# Patient Record
Sex: Female | Born: 1945 | Race: White | Hispanic: No | Marital: Married | Smoking: Never smoker
Health system: Southern US, Community
[De-identification: ages and names within clinical notes are randomized; demographics above are authoritative.]

## PROBLEM LIST (undated history)

## (undated) DIAGNOSIS — F329 Major depressive disorder, single episode, unspecified: Secondary | ICD-10-CM

## (undated) DIAGNOSIS — I10 Essential (primary) hypertension: Secondary | ICD-10-CM

## (undated) DIAGNOSIS — F32A Depression, unspecified: Secondary | ICD-10-CM

## (undated) DIAGNOSIS — R112 Nausea with vomiting, unspecified: Secondary | ICD-10-CM

## (undated) DIAGNOSIS — K219 Gastro-esophageal reflux disease without esophagitis: Secondary | ICD-10-CM

## (undated) DIAGNOSIS — K5790 Diverticulosis of intestine, part unspecified, without perforation or abscess without bleeding: Secondary | ICD-10-CM

## (undated) DIAGNOSIS — J8410 Pulmonary fibrosis, unspecified: Secondary | ICD-10-CM

## (undated) DIAGNOSIS — Z972 Presence of dental prosthetic device (complete) (partial): Secondary | ICD-10-CM

## (undated) DIAGNOSIS — M1990 Unspecified osteoarthritis, unspecified site: Secondary | ICD-10-CM

## (undated) DIAGNOSIS — N390 Urinary tract infection, site not specified: Secondary | ICD-10-CM

## (undated) DIAGNOSIS — Z9889 Other specified postprocedural states: Secondary | ICD-10-CM

## (undated) DIAGNOSIS — E119 Type 2 diabetes mellitus without complications: Secondary | ICD-10-CM

## (undated) DIAGNOSIS — N393 Stress incontinence (female) (male): Secondary | ICD-10-CM

## (undated) DIAGNOSIS — G5602 Carpal tunnel syndrome, left upper limb: Secondary | ICD-10-CM

## (undated) HISTORY — DX: Major depressive disorder, single episode, unspecified: F32.9

## (undated) HISTORY — PX: EYE SURGERY: SHX253

## (undated) HISTORY — DX: Diverticulosis of intestine, part unspecified, without perforation or abscess without bleeding: K57.90

## (undated) HISTORY — PX: TUBAL LIGATION: SHX77

## (undated) HISTORY — PX: CARPAL TUNNEL RELEASE: SHX101

## (undated) HISTORY — PX: ABDOMINAL HYSTERECTOMY: SHX81

## (undated) HISTORY — PX: FRACTURE SURGERY: SHX138

## (undated) HISTORY — DX: Depression, unspecified: F32.A

## (undated) HISTORY — DX: Type 2 diabetes mellitus without complications: E11.9

## (undated) HISTORY — PX: SEPTOPLASTY: SUR1290

## (undated) HISTORY — DX: Gastro-esophageal reflux disease without esophagitis: K21.9

## (undated) HISTORY — PX: COLPORRHAPHY: SHX921

## (undated) HISTORY — DX: Unspecified osteoarthritis, unspecified site: M19.90

## (undated) HISTORY — DX: Essential (primary) hypertension: I10

## (undated) HISTORY — PX: OTHER SURGICAL HISTORY: SHX169

## (undated) HISTORY — PX: BLADDER SUSPENSION: SHX72

---

## 2005-09-15 ENCOUNTER — Ambulatory Visit: Payer: Self-pay | Admitting: General Surgery

## 2005-10-01 ENCOUNTER — Ambulatory Visit: Payer: Self-pay | Admitting: Unknown Physician Specialty

## 2005-10-12 ENCOUNTER — Ambulatory Visit: Payer: Self-pay | Admitting: Unknown Physician Specialty

## 2006-05-05 ENCOUNTER — Ambulatory Visit: Payer: Self-pay | Admitting: Unknown Physician Specialty

## 2007-07-19 ENCOUNTER — Ambulatory Visit: Payer: Self-pay

## 2008-11-20 ENCOUNTER — Ambulatory Visit: Payer: Self-pay | Admitting: Unknown Physician Specialty

## 2009-03-22 ENCOUNTER — Ambulatory Visit: Payer: Self-pay | Admitting: Gastroenterology

## 2009-11-21 ENCOUNTER — Ambulatory Visit: Payer: Self-pay | Admitting: Family Medicine

## 2010-08-15 ENCOUNTER — Ambulatory Visit: Payer: Self-pay | Admitting: Internal Medicine

## 2010-12-15 ENCOUNTER — Ambulatory Visit: Payer: Self-pay | Admitting: Family Medicine

## 2011-02-27 ENCOUNTER — Emergency Department: Payer: Self-pay | Admitting: Emergency Medicine

## 2011-03-06 ENCOUNTER — Ambulatory Visit: Payer: Self-pay | Admitting: Orthopedic Surgery

## 2011-07-23 ENCOUNTER — Ambulatory Visit: Payer: Self-pay | Admitting: Obstetrics & Gynecology

## 2011-07-23 LAB — CBC
HCT: 36.9 % (ref 35.0–47.0)
HGB: 12.3 g/dL (ref 12.0–16.0)
Platelet: 224 10*3/uL (ref 150–440)
RBC: 4.35 10*6/uL (ref 3.80–5.20)
RDW: 16.4 % — ABNORMAL HIGH (ref 11.5–14.5)
WBC: 7.3 10*3/uL (ref 3.6–11.0)

## 2011-07-23 LAB — BASIC METABOLIC PANEL
Calcium, Total: 8.7 mg/dL (ref 8.5–10.1)
Co2: 29 mmol/L (ref 21–32)
EGFR (Non-African Amer.): >60
Glucose: 137 mg/dL — ABNORMAL HIGH (ref 65–99)
Sodium: 141 mmol/L (ref 136–145)

## 2011-07-23 LAB — PROTIME-INR: INR: 1

## 2011-07-23 LAB — APTT: Activated PTT: 26.5 secs (ref 23.6–35.9)

## 2011-07-30 ENCOUNTER — Inpatient Hospital Stay: Payer: Self-pay | Admitting: Obstetrics & Gynecology

## 2011-07-31 LAB — HEMOGLOBIN: HGB: 12.3 g/dL (ref 12.0–16.0)

## 2011-08-03 LAB — PATHOLOGY REPORT

## 2011-10-23 ENCOUNTER — Ambulatory Visit: Payer: Self-pay | Admitting: Gastroenterology

## 2011-12-30 ENCOUNTER — Ambulatory Visit: Payer: Self-pay | Admitting: Family Medicine

## 2012-01-05 DIAGNOSIS — R339 Retention of urine, unspecified: Secondary | ICD-10-CM | POA: Insufficient documentation

## 2012-01-05 DIAGNOSIS — N319 Neuromuscular dysfunction of bladder, unspecified: Secondary | ICD-10-CM | POA: Insufficient documentation

## 2012-01-05 DIAGNOSIS — N3020 Other chronic cystitis without hematuria: Secondary | ICD-10-CM | POA: Insufficient documentation

## 2012-06-27 DIAGNOSIS — N3941 Urge incontinence: Secondary | ICD-10-CM | POA: Insufficient documentation

## 2012-11-04 DIAGNOSIS — N819 Female genital prolapse, unspecified: Secondary | ICD-10-CM | POA: Insufficient documentation

## 2012-11-18 ENCOUNTER — Ambulatory Visit: Payer: Self-pay | Admitting: Anesthesiology

## 2012-11-18 LAB — BASIC METABOLIC PANEL
Anion Gap: 3 — ABNORMAL LOW (ref 7–16)
BUN: 17 mg/dL (ref 7–18)
Calcium, Total: 8.8 mg/dL (ref 8.5–10.1)
Chloride: 109 mmol/L — ABNORMAL HIGH (ref 98–107)
Co2: 28 mmol/L (ref 21–32)
Creatinine: 0.54 mg/dL — ABNORMAL LOW (ref 0.60–1.30)
EGFR (African American): >60
EGFR (Non-African Amer.): >60
Glucose: 109 mg/dL — ABNORMAL HIGH (ref 65–99)
Osmolality: 282 (ref 275–301)
Potassium: 4 mmol/L (ref 3.5–5.1)
Sodium: 140 mmol/L (ref 136–145)

## 2012-11-22 ENCOUNTER — Ambulatory Visit: Payer: Self-pay | Admitting: Orthopedic Surgery

## 2013-01-02 ENCOUNTER — Ambulatory Visit: Payer: Self-pay | Admitting: Family Medicine

## 2013-01-12 ENCOUNTER — Ambulatory Visit: Payer: Self-pay | Admitting: Family Medicine

## 2013-04-05 DIAGNOSIS — N390 Urinary tract infection, site not specified: Secondary | ICD-10-CM | POA: Insufficient documentation

## 2013-05-01 DIAGNOSIS — M1991 Primary osteoarthritis, unspecified site: Secondary | ICD-10-CM | POA: Insufficient documentation

## 2013-05-01 DIAGNOSIS — E119 Type 2 diabetes mellitus without complications: Secondary | ICD-10-CM | POA: Insufficient documentation

## 2013-05-01 DIAGNOSIS — I10 Essential (primary) hypertension: Secondary | ICD-10-CM | POA: Insufficient documentation

## 2013-05-01 DIAGNOSIS — J309 Allergic rhinitis, unspecified: Secondary | ICD-10-CM | POA: Insufficient documentation

## 2014-03-28 ENCOUNTER — Ambulatory Visit: Payer: Self-pay | Admitting: Family Medicine

## 2014-06-29 NOTE — Op Note (Signed)
PATIENT NAME:  Tiffany Alexander, Tiffany Alexander MR#:  [id number] DATE OF BIRTH:  Dec 14, 1945  DATE OF PROCEDURE:  11/22/2012  PREOPERATIVE DIAGNOSIS: Left carpal tunnel syndrome.   POSTOPERATIVE DIAGNOSIS: Left carpal tunnel syndrome.   PROCEDURE: Left carpal tunnel release.   ANESTHESIA: General.   SURGEON: Laurene Footman, M.D.   DESCRIPTION OF PROCEDURE: The patient was brought to the operating room and after adequate anesthesia was obtained, the left arm was prepped and draped in the usual sterile fashion with a tourniquet applied to the upper arm. After patient identification, timeout procedures were completed. The tourniquet was raised to 250 mmHg. Incision was made in line with the ring metacarpal; incision down through the skin and subcutaneous tissue. Transcarpal ligament was identified and the ligament opened. A small hemostat was placed underneath it to protect the underlying structures, and resection was carried out distally and then proximally. In the midportion of the carpal tunnel, there was some hourglass constriction of the median nerve. The epineurium did not appear to be tethered, but there was definite compression. After release proximal to the proximal flexion crease of the wrist, there was good vascular blush to the nerve. The wound was irrigated and then closed with simple interrupted 4-0 nylon skin sutures. Then, 10 mL of 0.5% Sensorcaine without epinephrine was infiltrated for postop analgesia. Xeroform, 4 x 4's, Webril and Ace wrap were applied.   TOURNIQUET TIME: 12 minutes.   COMPLICATIONS: None.   SPECIMEN: None.   ____________________________ Laurene Footman, MD mjm:gb D: 11/22/2012 19:35:34 ET T: 11/22/2012 22:48:10 ET JOB#: [id number]  cc: Laurene Footman, MD, <Dictator> Laurene Footman MD ELECTRONICALLY SIGNED 11/23/2012 8:12

## 2014-07-01 NOTE — Op Note (Signed)
PATIENT NAME:  Tiffany Alexander, Tiffany Alexander MR#:  [id number] DATE OF BIRTH:  16-May-1945  DATE OF PROCEDURE:  07/30/2011  PREOPERATIVE DIAGNOSES:  1. Pelvic organ prolapse.  2. Cystocele.  3. Mixed urinary incontinence.   POSTOPERATIVE DIAGNOSES:  1. Pelvic organ prolapse.  2. Cystocele.  3. Mixed urinary incontinence.     PROCEDURES PERFORMED:  1. Total vaginal hysterectomy. 2. Anterior colporrhaphy.  3. Pubovaginal sling with tension-free vaginal tape. 4. Cystoscopy.   SURGEON: Glean Salen, M.D.   ASSISTANT: Verlene Mayer M.D.   ANESTHESIA: General.   ESTIMATED BLOOD LOSS: 25 mL.  COMPLICATIONS: None.   FINDINGS: Normal small uterus, ovaries were atrophic and high and were not able to be retrieved. Cystoscopy revealed normal bladder after the TVT was performed.   DISPOSITION: To the recovery room in stable condition.   TECHNIQUE: The patient is prepped and draped in the usual sterile fashion after adequate anesthesia is obtained in the dorsal lithotomy position. The bladder is drained with a catheter. Speculum is placed and the cervix is grasped with a tenaculum. The circumference is infiltrated with 1% lidocaine with epinephrine and the circumference is then incised using Bovie electrocautery. The posterior peritoneum is penetrated with dissection and a long weighted speculum is placed. The uterosacral ligaments are clamped, transected, and suture ligated, and then sutured to the vaginal cuff. Anterior peritoneum was dissected and with penetration, a retractor placed. The cardinal ligament and uterine artery pedicles were clamped, transected, and suture ligated and the remainder other support to the uterus is sequentially clamped, transected, and suture ligated to complete amputation of the uterus and cervix was performed and is handed to pathology.   The adnexa is noted to be high and unable to be retrieved. The peritoneum is closed with a 1-0 Vicryl suture and the uterosacral ligaments  are plicated using one Ethibond suture.   Allis clamps are placed along the anterior vaginal wall in the midline. The tissues are infiltrated with 1% lidocaine with epinephrine. On the mons pubis at the appropriate location for TVT placement using a spinal needle, lidocaine with epinephrine is used to infiltrate down to the level of the pubic symphysis. A scalpel is used to make an incision and then the dissection in the midline and vaginal wall is performed with Metzenbaum scissors. The endopelvic fascia is dissected away from the vaginal mucosa and a careful palpation close to the retropubic space is performed without complete penetration. A rigid catheter guide is placed through the Foley catheter and the bladder is deviated to each side during the placement of the TVT. Using the TVT device, a trocar is placed through the retropubic space on either side of the bladder neck with it exiting through the two incision areas in the mons pubis.   Cystoscopy is performed with saline distention of the bladder of 200 mL. No injuries, suture sling material or bleeding is noted within the bladder. The cystoscope was removed with fluid intentionally left within the bladder.   The tension-free vaginal tape is pulled to an appropriate position. Suprapubic pressure reveals leakage of the fluid that was left in the bladder and so more tension is applied until minimal leakage is noted. A right renal clamp is placed between the sling material and the vaginal tissues as the sleeves of the TVT is removed and the TVT is cut off at the level of the skin with skin closure using Dermabond.   Anterior colporrhaphy plication sutures are then placed with 1-0 Vicryl sutures also to  overlie the graft. Foley catheter is inserted. The vaginal mucosa is closed with a 2-0 Vicryl suture in a running locking fashion with irrigation of the vaginal cavity and placement of a packing sponge      coated with Premarin vaginal cream. The  patient goes to the recovery room in stable condition having tolerated the procedure well. All sponge, instrument, and needle counts are correct.   ____________________________ R. Barnett Applebaum, MD rph:ap D: 07/30/2011 09:04:46 ET T: 07/30/2011 13:44:37 ET JOB#: [id number]  cc: Glean Salen, MD, <Dictator> Gae Dry MD ELECTRONICALLY SIGNED 08/01/2011 8:57

## 2014-07-02 ENCOUNTER — Ambulatory Visit: Payer: Self-pay

## 2014-07-02 ENCOUNTER — Ambulatory Visit
Admit: 2014-07-02 | Disposition: A | Discharge status: Home / Self Care | Payer: Self-pay | Admitting: Orthopedic Surgery

## 2014-07-02 LAB — BASIC METABOLIC PANEL
Anion Gap: 8 (ref 7–16)
BUN: 22 mg/dL — ABNORMAL HIGH
CHLORIDE: 103 mmol/L
CO2: 29 mmol/L
CREATININE: 0.52 mg/dL
Calcium, Total: 9.4 mg/dL
EGFR (African American): >60
EGFR (Non-African Amer.): >60
Glucose: 130 mg/dL — ABNORMAL HIGH
POTASSIUM: 4.3 mmol/L
Sodium: 140 mmol/L

## 2014-07-12 ENCOUNTER — Encounter: Payer: Self-pay | Admitting: *Deleted

## 2014-07-12 ENCOUNTER — Ambulatory Visit: Payer: Medicare HMO | Admitting: Certified Registered Nurse Anesthetist

## 2014-07-12 ENCOUNTER — Ambulatory Visit: Payer: Medicare HMO

## 2014-07-12 ENCOUNTER — Ambulatory Visit
Admission: RE | Admit: 2014-07-12 | Discharge: 2014-07-12 | Disposition: A | Discharge status: Home / Self Care | Payer: Medicare HMO | Source: Ambulatory Visit | Attending: Orthopedic Surgery | Admitting: Orthopedic Surgery

## 2014-07-12 ENCOUNTER — Encounter
Admission: RE | Disposition: A | Discharge status: Home / Self Care | Payer: Self-pay | Source: Ambulatory Visit | Attending: Orthopedic Surgery

## 2014-07-12 DIAGNOSIS — N393 Stress incontinence (female) (male): Secondary | ICD-10-CM | POA: Insufficient documentation

## 2014-07-12 DIAGNOSIS — Z881 Allergy status to other antibiotic agents status: Secondary | ICD-10-CM | POA: Diagnosis not present

## 2014-07-12 DIAGNOSIS — I10 Essential (primary) hypertension: Secondary | ICD-10-CM | POA: Insufficient documentation

## 2014-07-12 DIAGNOSIS — M19041 Primary osteoarthritis, right hand: Secondary | ICD-10-CM | POA: Diagnosis present

## 2014-07-12 DIAGNOSIS — Z833 Family history of diabetes mellitus: Secondary | ICD-10-CM | POA: Insufficient documentation

## 2014-07-12 DIAGNOSIS — Z79899 Other long term (current) drug therapy: Secondary | ICD-10-CM | POA: Diagnosis not present

## 2014-07-12 DIAGNOSIS — Z9889 Other specified postprocedural states: Secondary | ICD-10-CM | POA: Diagnosis not present

## 2014-07-12 DIAGNOSIS — Z90710 Acquired absence of both cervix and uterus: Secondary | ICD-10-CM | POA: Diagnosis not present

## 2014-07-12 DIAGNOSIS — E118 Type 2 diabetes mellitus with unspecified complications: Secondary | ICD-10-CM | POA: Diagnosis not present

## 2014-07-12 DIAGNOSIS — Z885 Allergy status to narcotic agent status: Secondary | ICD-10-CM | POA: Insufficient documentation

## 2014-07-12 DIAGNOSIS — Z8249 Family history of ischemic heart disease and other diseases of the circulatory system: Secondary | ICD-10-CM | POA: Diagnosis not present

## 2014-07-12 DIAGNOSIS — Z880 Allergy status to penicillin: Secondary | ICD-10-CM | POA: Insufficient documentation

## 2014-07-12 DIAGNOSIS — Z809 Family history of malignant neoplasm, unspecified: Secondary | ICD-10-CM | POA: Diagnosis not present

## 2014-07-12 HISTORY — PX: FINGER ARTHRODESIS: SHX5000

## 2014-07-12 LAB — GLUCOSE, CAPILLARY: Glucose-Capillary: 134 mg/dL — ABNORMAL HIGH (ref 70–99)

## 2014-07-12 SURGERY — FUSION, JOINT, FINGER
Anesthesia: General | Laterality: Right

## 2014-07-12 MED ORDER — ACETAMINOPHEN 10 MG/ML IV SOLN
INTRAVENOUS | Status: AC
  Filled 2014-07-12: qty 100

## 2014-07-12 MED ORDER — BUPIVACAINE HCL (PF) 0.5 % IJ SOLN
INTRAMUSCULAR | Status: DC | PRN
  Administered 2014-07-12: 10 mL

## 2014-07-12 MED ORDER — PROPOFOL 10 MG/ML IV BOLUS
INTRAVENOUS | Status: DC | PRN
  Administered 2014-07-12: 160 mg via INTRAVENOUS

## 2014-07-12 MED ORDER — NEOMYCIN-POLYMYXIN B GU 40-200000 IR SOLN
Status: DC | PRN
  Administered 2014-07-12: 100 mL

## 2014-07-12 MED ORDER — CLINDAMYCIN PHOSPHATE 900 MG/50ML IV SOLN
900.0000 mg | Freq: Once | INTRAVENOUS | Status: AC
  Administered 2014-07-12: 900 mg via INTRAVENOUS

## 2014-07-12 MED ORDER — HYDROCODONE-ACETAMINOPHEN 5-325 MG PO TABS: 1.0000 | ORAL_TABLET | Freq: Four times a day (QID) | ORAL | Status: DC | PRN

## 2014-07-12 MED ORDER — CLINDAMYCIN PHOSPHATE 900 MG/50ML IV SOLN
INTRAVENOUS | Status: AC
  Filled 2014-07-12: qty 50

## 2014-07-12 MED ORDER — FENTANYL CITRATE (PF) 100 MCG/2ML IJ SOLN: 25.0000 ug | INTRAMUSCULAR | Status: DC | PRN

## 2014-07-12 MED ORDER — ONDANSETRON HCL 4 MG/2ML IJ SOLN: 4.0000 mg | Freq: Once | INTRAMUSCULAR | Status: DC | PRN

## 2014-07-12 MED ORDER — FENTANYL CITRATE (PF) 100 MCG/2ML IJ SOLN
INTRAMUSCULAR | Status: DC | PRN
  Administered 2014-07-12: 100 ug via INTRAVENOUS

## 2014-07-12 MED ORDER — NEOMYCIN-POLYMYXIN B GU 40-200000 IR SOLN
Status: AC
  Filled 2014-07-12: qty 2

## 2014-07-12 MED ORDER — EPHEDRINE SULFATE 50 MG/ML IJ SOLN
INTRAMUSCULAR | Status: DC | PRN
  Administered 2014-07-12: 10 mg via INTRAVENOUS

## 2014-07-12 MED ORDER — LIDOCAINE HCL (CARDIAC) 20 MG/ML IV SOLN
INTRAVENOUS | Status: DC | PRN
  Administered 2014-07-12: 50 mg via INTRAVENOUS

## 2014-07-12 MED ORDER — MIDAZOLAM HCL 2 MG/2ML IJ SOLN
INTRAMUSCULAR | Status: DC | PRN
  Administered 2014-07-12: 2 mg via INTRAVENOUS

## 2014-07-12 MED ORDER — DEXAMETHASONE SODIUM PHOSPHATE 4 MG/ML IJ SOLN
INTRAMUSCULAR | Status: DC | PRN
Start: 2014-07-12 — End: 2014-07-12
  Administered 2014-07-12: 8 mg via INTRAVENOUS

## 2014-07-12 MED ORDER — SODIUM CHLORIDE 0.9 % IV SOLN
INTRAVENOUS | Status: DC
  Administered 2014-07-12: 08:00:00 via INTRAVENOUS

## 2014-07-12 MED ORDER — BUPIVACAINE HCL (PF) 0.5 % IJ SOLN
INTRAMUSCULAR | Status: AC
  Filled 2014-07-12: qty 30

## 2014-07-12 MED ORDER — ONDANSETRON HCL 4 MG/2ML IJ SOLN
INTRAMUSCULAR | Status: DC | PRN
  Administered 2014-07-12: 4 mg via INTRAVENOUS

## 2014-07-12 MED ORDER — ACETAMINOPHEN 10 MG/ML IV SOLN
INTRAVENOUS | Status: DC | PRN
  Administered 2014-07-12: 1000 mg via INTRAVENOUS

## 2014-07-12 SURGICAL SUPPLY — 34 items
BANDAGE ELASTIC 4 CLIP NS LF (GAUZE/BANDAGES/DRESSINGS) ×2 IMPLANT
BIT DRILL 24 ACUTRAK FUSION (BIT) ×2 IMPLANT
BNDG COHESIVE 1X5 TAN NS LF (GAUZE/BANDAGES/DRESSINGS) ×2 IMPLANT
BNDG COHESIVE 4X5 TAN STRL (GAUZE/BANDAGES/DRESSINGS) ×2 IMPLANT
BNDG ESMARK 4X12 TAN STRL LF (GAUZE/BANDAGES/DRESSINGS) IMPLANT
BNDG GAUZE 1X2.1 STRL (MISCELLANEOUS) ×2 IMPLANT
CHLORAPREP W/TINT 26ML (MISCELLANEOUS) ×2 IMPLANT
DRAPE FLUOR MINI C-ARM 54X84 (DRAPES) ×2 IMPLANT
ELECT CAUTERY BLADE 6.4 (BLADE) ×2 IMPLANT
FUSION DEVICE 22.0 ACCTRAK (Screw) ×2 IMPLANT
GAUZE SPONGE 4X4 12PLY STRL (GAUZE/BANDAGES/DRESSINGS) ×2 IMPLANT
GAUZE XEROFORM 4X4 STRL (GAUZE/BANDAGES/DRESSINGS) ×2 IMPLANT
GLOVE BIOGEL PI IND STRL 9 (GLOVE) ×1 IMPLANT
GLOVE BIOGEL PI INDICATOR 9 (GLOVE) ×1
GLOVE SURG ORTHO 9.0 STRL STRW (GLOVE) ×2 IMPLANT
GOWN SPECIALTY ULTRA XL (MISCELLANEOUS) ×2 IMPLANT
GOWN STRL REUS W/ TWL LRG LVL3 (GOWN DISPOSABLE) ×1 IMPLANT
GOWN STRL REUS W/TWL LRG LVL3 (GOWN DISPOSABLE) ×1
GUIDEWIRE ORTHO 062 (WIRE) ×2 IMPLANT
IV CATH ANGIO 14GX3.25 ORG (MISCELLANEOUS) ×2 IMPLANT
NDL SAFETY 25GX1.5 (NEEDLE) ×2 IMPLANT
NEEDLE FILTER BLUNT 18X 1/2SAF (NEEDLE) ×1
NEEDLE FILTER BLUNT 18X1 1/2 (NEEDLE) ×1 IMPLANT
NS IRRIG 500ML POUR BTL (IV SOLUTION) ×2 IMPLANT
PACK EXTREMITY ARMC (MISCELLANEOUS) ×2 IMPLANT
PAD GROUND ADULT SPLIT (MISCELLANEOUS) ×2 IMPLANT
PAD PREP 24X41 OB/GYN DISP (PERSONAL CARE ITEMS) ×2 IMPLANT
PADDING CAST BLEND 4X4 NS (MISCELLANEOUS) ×2 IMPLANT
STOCKINETTE BIAS CUT 3 980034 (MISCELLANEOUS) IMPLANT
STOCKINETTE STRL 4IN 9604848 (GAUZE/BANDAGES/DRESSINGS) ×2 IMPLANT
STRAP SAFETY BODY (MISCELLANEOUS) ×2 IMPLANT
SUT ETHIBOND 4-0 (SUTURE) ×2 IMPLANT
SUT ETHILON 5 0 CL P 3 (SUTURE) ×2 IMPLANT
SYRINGE 10CC LL (SYRINGE) ×2 IMPLANT

## 2014-07-12 NOTE — Discharge Instructions (Addendum)
Keep dressing clean and dry Elevate hand Can wrap more gauze if there is bloody drainage

## 2014-07-12 NOTE — Anesthesia Preprocedure Evaluation (Signed)
Anesthesia Evaluation  Patient identified by MRN, date of birth, ID band Patient awake    Reviewed: Allergy & Precautions, NPO status , Patient's Chart, lab work & pertinent test results, reviewed documented beta blocker date and time   History of Anesthesia Complications (+) PONV and history of anesthetic complications  Airway Mallampati: II  TM Distance: >3 FB Neck ROM: Full    Dental  (+) Upper Dentures, Partial Lower   Pulmonary neg pulmonary ROS,    Pulmonary exam normal       Cardiovascular hypertension, Pt. on medications and Pt. on home beta blockers Normal cardiovascular exam    Neuro/Psych PSYCHIATRIC DISORDERS Depression negative neurological ROS     GI/Hepatic Neg liver ROS, GERD-  Medicated and Controlled,  Endo/Other  diabetes, Well Controlled, Type 2  Renal/GU negative Renal ROS     Musculoskeletal  (+) Arthritis -, Osteoarthritis,    Abdominal   Peds  Hematology negative hematology ROS (+)   Anesthesia Other Findings   Reproductive/Obstetrics                             Anesthesia Physical Anesthesia Plan  ASA: III  Anesthesia Plan: General   Post-op Pain Management:    Induction: Intravenous  Airway Management Planned: LMA  Additional Equipment:   Intra-op Plan:   Post-operative Plan: Extubation in OR  Informed Consent: I have reviewed the patients History and Physical, chart, labs and discussed the procedure including the risks, benefits and alternatives for the proposed anesthesia with the patient or authorized representative who has indicated his/her understanding and acceptance.   Dental advisory given  Plan Discussed with: CRNA and Surgeon  Anesthesia Plan Comments:         Anesthesia Quick Evaluation

## 2014-07-12 NOTE — Anesthesia Postprocedure Evaluation (Signed)
  Anesthesia Post-op Note  Patient: Tiffany Alexander  Procedure(s) Performed: Procedure(s): Right middle finger DIP fussion (Right)  Anesthesia type:General  Patient location: PACU  Post pain: Pain level controlled  Post assessment: Post-op Vital signs reviewed, Patient's Cardiovascular Status Stable, Respiratory Function Stable, Patent Airway and No signs of Nausea or vomiting  Post vital signs: Reviewed and stable  Last Vitals:  Filed Vitals:   07/12/14 0948  BP:   Pulse:   Temp: 36.7 C  Resp:     Level of consciousness: awake, alert  and patient cooperative  Complications: No apparent anesthesia complications

## 2014-07-12 NOTE — Transfer of Care (Signed)
Immediate Anesthesia Transfer of Care Note  Patient: Tiffany Alexander  Procedure(s) Performed: Procedure(s): Right middle finger DIP fussion (Right)  Patient Location: PACU  Anesthesia Type:General  Level of Consciousness: awake and alert   Airway & Oxygen Therapy: Patient Spontanous Breathing  Post-op Assessment: Report given to RN and Post -op Vital signs reviewed and stable  Post vital signs: Reviewed and stable  Last Vitals:  Filed Vitals:   07/12/14 0700  BP: 136/68  Pulse: 79  Temp: 36.6 C  Resp: 16    Complications: No apparent anesthesia complications

## 2014-07-12 NOTE — H&P (Signed)
Reviewed paper H+P, will be scanned into chart. No changes noted.  

## 2014-07-12 NOTE — Brief Op Note (Signed)
07/12/2014  9:55 AM  PATIENT:  Tiffany Alexander  69 y.o. female  PRE-OPERATIVE DIAGNOSIS:  Osteoarthritis of the right long finger  POST-OPERATIVE DIAGNOSIS:  * No post-op diagnosis entered *  PROCEDURE:  Procedure(s): Right middle finger DIP fussion (Right)  SURGEON:  Surgeon(s) and Role:    * Hessie Knows, MD - Primary  PHYSICIAN ASSISTANT:   ASSISTANTS: none   ANESTHESIA:   general  EBL:  Total I/O In: 150 [I.V.:150] Out: 10 [Blood:10]  BLOOD ADMINISTERED:none  DRAINS: none   LOCAL MEDICATIONS USED:  MARCAINE     SPECIMEN:  No Specimen  DISPOSITION OF SPECIMEN:  N/A  COUNTS:  YES  TOURNIQUET:   Total Tourniquet Time Documented: Upper Arm (Right) - 21 minutes Total: Upper Arm (Right) - 21 minutes   DICTATION: .Viviann Spare Dictation  PLAN OF CARE: Discharge to home after PACU  PATIENT DISPOSITION:  PACU - hemodynamically stable.   Delay start of Pharmacological VTE agent (>24hrs) due to surgical blood loss or risk of bleeding: not applicable

## 2014-07-12 NOTE — Brief Op Note (Signed)
07/12/2014  9:50 AM  PATIENT:  Tiffany Alexander  69 y.o. female  PRE-OPERATIVE DIAGNOSIS:  Osteoarthritis of the right long finger  POST-OPERATIVE DIAGNOSIS:  same  PROCEDURE:  Procedure(s): Right middle finger DIP fussion (Right)  SURGEON:  Surgeon(s) and Role:    * Hessie Knows, MD - Primary     ANESTHESIA:   general  EBL:   minimal  BLOOD ADMINISTERED:none  DRAINS: none   LOCAL MEDICATIONS USED:  MARCAINE     SPECIMEN:  No Specimen  DISPOSITION OF SPECIMEN:  N/A  COUNTS:  YES  TOURNIQUET:   Total Tourniquet Time Documented: Upper Arm (Right) - 21 minutes Total: Upper Arm (Right) - 21 minutes   DICTATION: .Viviann Spare Dictation patient brought to the operating room and after adequate anesthesia was obtained the right arm was prepped and draped in sterile fashion was turned by the upper arm. After patient identification and timeout procedure were completed, tourniquet was raised to 250 mmHg. A T-shaped incision was made over the DIP joint and the extensor tendon incised dorsal spur was debrided. The sclerotic ends of the IP joint were debrided to get bleeding bone. K wires then inserted down the middle phalanx and then out through the distal phalanx and then back into the middle phalanx alignment appeared anatomic with slight flexion. Measurements were made off the K wire and a 22 mm screw was chosen. The K wires removed and drilling was carried out with a hand drill. The 22 mm screw was inserted to the appropriate depth, with the head buried in the distal phalanx. On mini C-arm views, there was anatomic alignment with good compression of the site of the fusion. The wound was then closed with 5-0 nylon. At the start close of the case 10 cc was infiltrated as a digital block for postoperative analgesia. There are no complications no specimen. Wound was dressed with Xeroform 2 x 2's and a 1 inch, Conform dressing  PLAN OF CARE: Discharge to home after PACU  PATIENT DISPOSITION:   PACU - hemodynamically stable.   Delay start of Pharmacological VTE agent (>24hrs) due to surgical blood loss or risk of bleeding: not applicable

## 2014-07-12 NOTE — Op Note (Signed)
07/12/2014  9:54 AM  PATIENT:  Tiffany Alexander  69 y.o. female  PRE-OPERATIVE DIAGNOSIS:  Osteoarthritis of the right long finger  POST-OPERATIVE DIAGNOSIS:  * No post-op diagnosis entered *  PROCEDURE:  Procedure(s): Right middle finger DIP fussion (Right)  SURGEON: Laurene Footman, MD  ASSISTANTS: None  ANESTHESIA:   general  EBL:  Total I/O In: 150 [I.V.:150] Out: 10 [Blood:10]  BLOOD ADMINISTERED:none  DRAINS: none   LOCAL MEDICATIONS USED:  MARCAINE     SPECIMEN:  No Specimen  DISPOSITION OF SPECIMEN:  N/A  COUNTS:  YES  TOURNIQUET:   Total Tourniquet Time Documented: Upper Arm (Right) - 21 minutes Total: Upper Arm (Right) - 21 minutes   IMPLANTS: 22 mm Acumed fusion screw  DICTATION: .Dragon Dictation patient brought to the operating room and after adequate anesthesia was obtained the right arm was prepped and draped in sterile fashion was turned by the upper arm. After patient identification and timeout procedure were completed, tourniquet was raised to 250 mmHg. A T-shaped incision was made over the DIP joint and the extensor tendon incised dorsal spur was debrided. The sclerotic ends of the IP joint were debrided to get bleeding bone. K wires then inserted down the middle phalanx and then out through the distal phalanx and then back into the middle phalanx alignment appeared anatomic with slight flexion. Measurements were made off the K wire and a 22 mm screw was chosen. The K wires removed and drilling was carried out with a hand drill. The 22 mm screw was inserted to the appropriate depth, with the head buried in the distal phalanx. On mini C-arm views, there was anatomic alignment with good compression of the site of the fusion. The wound was then closed with 5-0 nylon. At the start close of the case 10 cc was infiltrated as a digital block for postoperative analgesia. There are no complications no specimen. Wound was dressed with Xeroform 2 x 2's and a 1 inch,  Conform dressing  PLAN OF CARE: Discharge to home after PACU  PATIENT DISPOSITION:  PACU - hemodynamically stable.

## 2014-07-16 DIAGNOSIS — Z981 Arthrodesis status: Secondary | ICD-10-CM | POA: Insufficient documentation

## 2014-07-17 ENCOUNTER — Encounter: Payer: Self-pay | Admitting: Orthopedic Surgery

## 2014-07-20 ENCOUNTER — Encounter: Payer: Self-pay | Admitting: Orthopedic Surgery

## 2014-08-24 ENCOUNTER — Ambulatory Visit
Admission: RE | Admit: 2014-08-24 | Discharge: 2014-08-24 | Disposition: A | Discharge status: Home / Self Care | Payer: Medicare HMO | Source: Ambulatory Visit | Attending: Gastroenterology | Admitting: Gastroenterology

## 2014-08-24 ENCOUNTER — Ambulatory Visit: Payer: Medicare HMO | Admitting: Anesthesiology

## 2014-08-24 ENCOUNTER — Encounter
Admission: RE | Disposition: A | Discharge status: Home / Self Care | Payer: Self-pay | Source: Ambulatory Visit | Attending: Gastroenterology

## 2014-08-24 DIAGNOSIS — I10 Essential (primary) hypertension: Secondary | ICD-10-CM | POA: Insufficient documentation

## 2014-08-24 DIAGNOSIS — M1990 Unspecified osteoarthritis, unspecified site: Secondary | ICD-10-CM | POA: Diagnosis not present

## 2014-08-24 DIAGNOSIS — Z1211 Encounter for screening for malignant neoplasm of colon: Secondary | ICD-10-CM | POA: Diagnosis not present

## 2014-08-24 DIAGNOSIS — Z885 Allergy status to narcotic agent status: Secondary | ICD-10-CM | POA: Insufficient documentation

## 2014-08-24 DIAGNOSIS — Z881 Allergy status to other antibiotic agents status: Secondary | ICD-10-CM | POA: Diagnosis not present

## 2014-08-24 DIAGNOSIS — F329 Major depressive disorder, single episode, unspecified: Secondary | ICD-10-CM | POA: Diagnosis not present

## 2014-08-24 DIAGNOSIS — Z79899 Other long term (current) drug therapy: Secondary | ICD-10-CM | POA: Insufficient documentation

## 2014-08-24 DIAGNOSIS — Z79891 Long term (current) use of opiate analgesic: Secondary | ICD-10-CM | POA: Diagnosis not present

## 2014-08-24 DIAGNOSIS — E119 Type 2 diabetes mellitus without complications: Secondary | ICD-10-CM | POA: Diagnosis not present

## 2014-08-24 DIAGNOSIS — Z8371 Family history of colonic polyps: Secondary | ICD-10-CM | POA: Insufficient documentation

## 2014-08-24 DIAGNOSIS — K219 Gastro-esophageal reflux disease without esophagitis: Secondary | ICD-10-CM | POA: Insufficient documentation

## 2014-08-24 DIAGNOSIS — K5730 Diverticulosis of large intestine without perforation or abscess without bleeding: Secondary | ICD-10-CM | POA: Diagnosis not present

## 2014-08-24 DIAGNOSIS — Z880 Allergy status to penicillin: Secondary | ICD-10-CM | POA: Insufficient documentation

## 2014-08-24 DIAGNOSIS — E669 Obesity, unspecified: Secondary | ICD-10-CM | POA: Diagnosis not present

## 2014-08-24 HISTORY — PX: COLONOSCOPY: SHX5424

## 2014-08-24 LAB — CBC
HCT: 40.1 % (ref 35.0–47.0)
Hemoglobin: 13.2 g/dL (ref 12.0–16.0)
MCH: 29.1 pg (ref 26.0–34.0)
MCHC: 32.9 g/dL (ref 32.0–36.0)
MCV: 88.3 fL (ref 80.0–100.0)
Platelets: 251 10*3/uL (ref 150–440)
RBC: 4.54 MIL/uL (ref 3.80–5.20)
RDW: 14.8 % — ABNORMAL HIGH (ref 11.5–14.5)
WBC: 6.3 10*3/uL (ref 3.6–11.0)

## 2014-08-24 LAB — PROTIME-INR
INR: 0.98
Prothrombin Time: 13.2 seconds (ref 11.4–15.0)

## 2014-08-24 LAB — GLUCOSE, CAPILLARY: Glucose-Capillary: 139 mg/dL — ABNORMAL HIGH (ref 65–99)

## 2014-08-24 SURGERY — COLONOSCOPY
Anesthesia: General

## 2014-08-24 MED ORDER — SODIUM CHLORIDE 0.9 % IV SOLN: INTRAVENOUS | Status: DC

## 2014-08-24 MED ORDER — PROPOFOL 10 MG/ML IV BOLUS
INTRAVENOUS | Status: DC | PRN
  Administered 2014-08-24: 50 mg via INTRAVENOUS

## 2014-08-24 MED ORDER — SODIUM CHLORIDE 0.9 % IV SOLN
INTRAVENOUS | Status: DC
  Administered 2014-08-24: 1000 mL via INTRAVENOUS

## 2014-08-24 MED ORDER — ONDANSETRON HCL 4 MG/2ML IJ SOLN
INTRAMUSCULAR | Status: DC | PRN
  Administered 2014-08-24: 4 mg via INTRAVENOUS

## 2014-08-24 MED ORDER — FENTANYL CITRATE (PF) 100 MCG/2ML IJ SOLN
INTRAMUSCULAR | Status: DC | PRN
  Administered 2014-08-24: 50 ug via INTRAVENOUS

## 2014-08-24 MED ORDER — PROPOFOL INFUSION 10 MG/ML OPTIME
INTRAVENOUS | Status: DC | PRN
  Administered 2014-08-24: 100 ug/kg/min via INTRAVENOUS

## 2014-08-24 MED ORDER — MIDAZOLAM HCL 2 MG/2ML IJ SOLN
INTRAMUSCULAR | Status: DC | PRN
  Administered 2014-08-24: 1 mg via INTRAVENOUS

## 2014-08-24 NOTE — Op Note (Signed)
[facility] Gastroenterology Patient Name: Tiffany Alexander Procedure Date: 08/24/2014 8:25 AM MRN: [id number] Account #: [id number] Date of Birth: Dec 13, 1945 Admit Type: Outpatient Age: 69 Room: [facility] ENDO ROOM 3 Gender: Female Note Status: Finalized Procedure:         Colonoscopy Indications:       Family history of colonic polyps in a first-degree relative Providers:         Lollie Sails, MD Referring MD:      Irven Easterly. Kary Kos, MD (Referring MD) Medicines:         Monitored Anesthesia Care Complications:     No immediate complications. Procedure:         Pre-Anesthesia Assessment:                    - ASA Grade Assessment: III - A patient with severe                     systemic disease.                    After obtaining informed consent, the colonoscope was                     passed under direct vision. Throughout the procedure, the                     patient's blood pressure, pulse, and oxygen saturations                     were monitored continuously. The Colonoscope was                     introduced through the anus with the intention of                     advancing to the cecum. The scope was advanced to the                     sigmoid colon before the procedure was aborted.                     Medications were given. The colonoscopy was unusually                     difficult due to poor bowel prep with stool present. The                     patient tolerated the procedure well. The quality of the                     bowel preparation was poor. Findings:      Multiple large-mouthed diverticula were found in the sigmoid colon.       Formed stool precluding evaluation.      The digital rectal exam was normal. Impression:        - Preparation of the colon was poor.                    - Diverticulosis in the sigmoid colon.                    - No specimens collected. Recommendation:    - will need to reprep and reschedule                    -  Discharge patient to home. Procedure Code(s): --- Professional ---                    [phone], Sigmoidoscopy, flexible; diagnostic, including                     collection of specimen(s) by brushing or washing, when                     performed (separate procedure) Diagnosis Code(s): --- Professional ---                    V18.51, Family history of colonic polyps                    562.10, Diverticulosis of colon (without mention of                     hemorrhage) CPT copyright 2014 American Medical [employer]. All rights reserved. The codes documented in this report are preliminary and upon coder review may  be revised to meet current compliance requirements. Lollie Sails, MD 08/24/2014 8:43:14 AM This report has been signed electronically. Number of Addenda: 0 Note Initiated On: 08/24/2014 8:25 AM Total Procedure Duration: 0 hours 4 minutes 42 seconds       [facility]

## 2014-08-24 NOTE — Anesthesia Postprocedure Evaluation (Signed)
  Anesthesia Post-op Note  Patient: Tiffany Alexander  Procedure(s) Performed: Procedure(s): COLONOSCOPY (N/A)  Anesthesia type:General  Patient location: PACU  Post pain: Pain level controlled  Post assessment: Post-op Vital signs reviewed, Patient's Cardiovascular Status Stable, Respiratory Function Stable, Patent Airway and No signs of Nausea or vomiting  Post vital signs: Reviewed and stable  Last Vitals:  Filed Vitals:   08/24/14 0917  BP: 147/75  Pulse: 78  Temp:   Resp: 16    Level of consciousness: awake, alert  and patient cooperative  Complications: No apparent anesthesia complications

## 2014-08-24 NOTE — Anesthesia Postprocedure Evaluation (Signed)
  Anesthesia Post-op Note  Patient: Tiffany Alexander  Procedure(s) Performed: Procedure(s): COLONOSCOPY (N/A)  Anesthesia type:General  Patient location: PACU  Post pain: Pain level controlled  Post assessment: Post-op Vital signs reviewed, Patient's Cardiovascular Status Stable, Respiratory Function Stable, Patent Airway and No signs of Nausea or vomiting  Post vital signs: Reviewed and stable  Last Vitals:  Filed Vitals:   08/24/14 0848  BP: 112/60  Pulse: 78  Temp: 36.4 C  Resp: 14    Level of consciousness: awake, alert  and patient cooperative  Complications: No apparent anesthesia complications

## 2014-08-24 NOTE — Anesthesia Preprocedure Evaluation (Signed)
Anesthesia Evaluation  Patient identified by MRN, date of birth, ID band Patient awake    Reviewed: Allergy & Precautions, NPO status , Patient's Chart, lab work & pertinent test results  Airway Mallampati: II  TM Distance: >3 FB Neck ROM: Limited    Dental  (+) Upper Dentures   Pulmonary          Cardiovascular Exercise Tolerance: Poor hypertension, Pt. on medications Normal cardiovascular exam    Neuro/Psych    GI/Hepatic GERD-  Medicated and Controlled,Fatty liver, waiting for INR.   Endo/Other  diabetes, Type 2  Renal/GU      Musculoskeletal   Abdominal (+) + obese,  Abdomen: soft.    Peds  Hematology   Anesthesia Other Findings   Reproductive/Obstetrics                             Anesthesia Physical Anesthesia Plan  ASA: III  Anesthesia Plan: General   Post-op Pain Management:    Induction: Intravenous  Airway Management Planned: Simple Face Mask  Additional Equipment:   Intra-op Plan:   Post-operative Plan:   Informed Consent: I have reviewed the patients History and Physical, chart, labs and discussed the procedure including the risks, benefits and alternatives for the proposed anesthesia with the patient or authorized representative who has indicated his/her understanding and acceptance.     Plan Discussed with: CRNA  Anesthesia Plan Comments:         Anesthesia Quick Evaluation

## 2014-08-24 NOTE — Transfer of Care (Signed)
Immediate Anesthesia Transfer of Care Note  Patient: Tiffany Alexander  Procedure(s) Performed: Procedure(s): COLONOSCOPY (N/A)  Patient Location: PACU  Anesthesia Type:General  Level of Consciousness: awake, alert , oriented and patient cooperative  Airway & Oxygen Therapy: Patient Spontanous Breathing and Patient connected to nasal cannula oxygen  Post-op Assessment: Report given to RN  Post vital signs: Reviewed and stable  Last Vitals:  Filed Vitals:   08/24/14 0725  BP: 151/76  Pulse: 92  Temp: 36.7 C  Resp: 20    Complications: No apparent anesthesia complications

## 2014-08-24 NOTE — H&P (Signed)
Outpatient short stay form Pre-procedure 08/24/2014 8:08 AM Tiffany Sails MD  Primary Physician: Dr. Maryland Pink  Reason for visit:  Colonoscopy  History of present illness:  Tiffany Alexander is a 69 year old Caucasian female who is presenting today for a repeat colonoscopy. Her last colonoscopy was in 2011 with a finding of diverticulosis and hemorrhoids. He has a family history of colon polyps in her father. He tolerated her prep well. No aspirin or a coagulation products.    Current facility-administered medications:  .  0.9 %  sodium chloride infusion, , Intravenous, Continuous, Tiffany Sails, MD, Last Rate: 50 mL/hr at 08/24/14 0719, 1,000 mL at 08/24/14 0719 .  0.9 %  sodium chloride infusion, , Intravenous, Continuous, Tiffany Sails, MD  Prescriptions prior to admission  Medication Sig Dispense Refill Last Dose  . amLODipine (NORVASC) 5 MG tablet Take 5 mg by mouth daily.   08/23/2014 at 830pm  . atenolol (TENORMIN) 100 MG tablet Take 100 mg by mouth daily.   08/23/2014 at 830pm  . citalopram (CELEXA) 10 MG tablet Take 10 mg by mouth daily.   08/23/2014 at 830pm  . etodolac (LODINE) 400 MG tablet Take 400 mg by mouth 2 (two) times daily as needed.   Past Week at Unknown time  . fexofenadine (ALLEGRA) 180 MG tablet Take 180 mg by mouth daily.   08/23/2014 at 700am  . fluticasone (FLONASE) 50 MCG/ACT nasal spray Place 2 sprays into both nostrils 2 (two) times daily as needed for allergies or rhinitis.   08/23/2014 at 700am  . furosemide (LASIX) 20 MG tablet Take 20 mg by mouth daily.   Past Week at Unknown time  . HYDROcodone-acetaminophen (NORCO) 5-325 MG per tablet Take 1 tablet by mouth every 6 (six) hours as needed for moderate pain. 30 tablet 0 Past Month at Unknown time  . losartan (COZAAR) 100 MG tablet Take 100 mg by mouth daily.   08/23/2014 at 830am  . metFORMIN (GLUCOPHAGE) 500 MG tablet Take 500 mg by mouth 2 (two) times daily with a meal.   08/23/2014 at Unknown time  .  omeprazole (PRILOSEC) 20 MG capsule Take 20 mg by mouth daily.   Past Week at 600am  . traMADol (ULTRAM) 50 MG tablet Take 50 mg by mouth 2 (two) times daily as needed.   Past Week at Unknown time  . traZODone (DESYREL) 100 MG tablet Take 100 mg by mouth at bedtime.   Past Week at Unknown time     Allergies  Allergen Reactions  . Codeine Itching  . Erythromycin Other (See Comments)    Stomach cramps  . Penicillins Rash     Past Medical History  Diagnosis Date  . Hypertension   . Diabetes mellitus without complication   . Depression   . GERD (gastroesophageal reflux disease)   . Arthritis   . Diverticulosis     Review of systems:      Physical Exam    Heart and lungs: Regular rate and rhythm lungs are bilaterally clear    HEENT: Normocephalic atraumatic    Other:     Pertinant exam for procedure: Soft nontender nondistended bowel sounds positive normoactive    Planned proceedures: Colonoscopy and indicated procedures I have discussed the risks benefits and complications of procedures to include not limited to bleeding, infection, perforation and the risk of sedation and the patient wishes to proceed.    Tiffany Sails, MD Gastroenterology 08/24/2014  8:08 AM

## 2014-09-03 ENCOUNTER — Encounter: Payer: Self-pay | Admitting: Gastroenterology

## 2015-04-04 ENCOUNTER — Encounter: Payer: Self-pay | Admitting: *Deleted

## 2015-04-05 ENCOUNTER — Encounter: Payer: Self-pay | Admitting: *Deleted

## 2015-04-05 ENCOUNTER — Encounter
Admission: RE | Disposition: A | Discharge status: Home / Self Care | Payer: Self-pay | Source: Ambulatory Visit | Attending: Gastroenterology

## 2015-04-05 ENCOUNTER — Ambulatory Visit: Payer: Medicare HMO | Admitting: Certified Registered Nurse Anesthetist

## 2015-04-05 ENCOUNTER — Ambulatory Visit
Admission: RE | Admit: 2015-04-05 | Discharge: 2015-04-05 | Disposition: A | Discharge status: Home / Self Care | Payer: Medicare HMO | Source: Ambulatory Visit | Attending: Gastroenterology | Admitting: Gastroenterology

## 2015-04-05 DIAGNOSIS — F329 Major depressive disorder, single episode, unspecified: Secondary | ICD-10-CM | POA: Diagnosis not present

## 2015-04-05 DIAGNOSIS — K219 Gastro-esophageal reflux disease without esophagitis: Secondary | ICD-10-CM | POA: Insufficient documentation

## 2015-04-05 DIAGNOSIS — I10 Essential (primary) hypertension: Secondary | ICD-10-CM | POA: Insufficient documentation

## 2015-04-05 DIAGNOSIS — K5730 Diverticulosis of large intestine without perforation or abscess without bleeding: Secondary | ICD-10-CM | POA: Insufficient documentation

## 2015-04-05 DIAGNOSIS — Z880 Allergy status to penicillin: Secondary | ICD-10-CM | POA: Insufficient documentation

## 2015-04-05 DIAGNOSIS — M1990 Unspecified osteoarthritis, unspecified site: Secondary | ICD-10-CM | POA: Insufficient documentation

## 2015-04-05 DIAGNOSIS — Z79899 Other long term (current) drug therapy: Secondary | ICD-10-CM | POA: Insufficient documentation

## 2015-04-05 DIAGNOSIS — E119 Type 2 diabetes mellitus without complications: Secondary | ICD-10-CM | POA: Insufficient documentation

## 2015-04-05 DIAGNOSIS — Z7951 Long term (current) use of inhaled steroids: Secondary | ICD-10-CM | POA: Diagnosis not present

## 2015-04-05 DIAGNOSIS — Z885 Allergy status to narcotic agent status: Secondary | ICD-10-CM | POA: Insufficient documentation

## 2015-04-05 DIAGNOSIS — Z1211 Encounter for screening for malignant neoplasm of colon: Secondary | ICD-10-CM | POA: Insufficient documentation

## 2015-04-05 DIAGNOSIS — Z8371 Family history of colonic polyps: Secondary | ICD-10-CM | POA: Insufficient documentation

## 2015-04-05 DIAGNOSIS — Z7984 Long term (current) use of oral hypoglycemic drugs: Secondary | ICD-10-CM | POA: Diagnosis not present

## 2015-04-05 HISTORY — PX: COLONOSCOPY WITH PROPOFOL: SHX5780

## 2015-04-05 HISTORY — DX: Other specified postprocedural states: Z98.890

## 2015-04-05 HISTORY — DX: Nausea with vomiting, unspecified: R11.2

## 2015-04-05 HISTORY — DX: Stress incontinence (female) (male): N39.3

## 2015-04-05 LAB — GLUCOSE, CAPILLARY: Glucose-Capillary: 134 mg/dL — ABNORMAL HIGH (ref 65–99)

## 2015-04-05 SURGERY — COLONOSCOPY WITH PROPOFOL
Anesthesia: General

## 2015-04-05 MED ORDER — PROPOFOL 500 MG/50ML IV EMUL
INTRAVENOUS | Status: DC | PRN
  Administered 2015-04-05: 140 ug/kg/min via INTRAVENOUS

## 2015-04-05 MED ORDER — SODIUM CHLORIDE 0.9 % IV SOLN: INTRAVENOUS | Status: DC

## 2015-04-05 MED ORDER — MIDAZOLAM HCL 2 MG/2ML IJ SOLN
INTRAMUSCULAR | Status: DC | PRN
  Administered 2015-04-05: 1 mg via INTRAVENOUS

## 2015-04-05 MED ORDER — PROPOFOL 10 MG/ML IV BOLUS
INTRAVENOUS | Status: DC | PRN
  Administered 2015-04-05: 10 mg via INTRAVENOUS
  Administered 2015-04-05 (×2): 30 mg via INTRAVENOUS

## 2015-04-05 MED ORDER — LIDOCAINE HCL (CARDIAC) 20 MG/ML IV SOLN
INTRAVENOUS | Status: DC | PRN
  Administered 2015-04-05: 60 mg via INTRAVENOUS

## 2015-04-05 MED ORDER — SODIUM CHLORIDE 0.9 % IV SOLN
INTRAVENOUS | Status: DC
  Administered 2015-04-05: 09:00:00 via INTRAVENOUS

## 2015-04-05 NOTE — H&P (Signed)
Outpatient short stay form Pre-procedure 04/05/2015 9:49 AM Tiffany Sails MD  Primary Physician: Dr. Maryland Pink  Reason for visit:  Colonoscopy  History of present illness:  Patient is a 70 year old female presenting today with a family history of colon polyps for colonoscopy. He tolerated her prep well. She takes no blood thinning agents or aspirin products.    Current facility-administered medications:  .  0.9 %  sodium chloride infusion, , Intravenous, Continuous, Tiffany Sails, MD, Last Rate: 20 mL/hr at 04/05/15 0926 .  0.9 %  sodium chloride infusion, , Intravenous, Continuous, Tiffany Sails, MD  Prescriptions prior to admission  Medication Sig Dispense Refill Last Dose  . amLODipine (NORVASC) 5 MG tablet Take 5 mg by mouth daily.   Past Week at Unknown time  . calcium citrate-vitamin D (CITRACAL+D) 315-200 MG-UNIT tablet Take 1 tablet by mouth 2 (two) times daily.     . Cyanocobalamin (VITAMIN B 12 PO) Take by mouth.     . diphenoxylate-atropine (LOMOTIL) 2.5-0.025 MG tablet Take 1 tablet by mouth 4 (four) times daily as needed for diarrhea or loose stools.   03/14/2015  . metFORMIN (GLUCOPHAGE) 500 MG tablet Take 500 mg by mouth 2 (two) times daily with a meal.   04/04/2015 at Unknown time  . vitamin E 400 UNIT capsule Take 400 Units by mouth daily.     Marland Kitchen atenolol (TENORMIN) 100 MG tablet Take 100 mg by mouth daily.   04/01/2015  . citalopram (CELEXA) 10 MG tablet Take 10 mg by mouth daily.   08/23/2014 at 830pm  . etodolac (LODINE) 400 MG tablet Take 400 mg by mouth 2 (two) times daily as needed.   Past Week at Unknown time  . fexofenadine (ALLEGRA) 180 MG tablet Take 180 mg by mouth daily.   08/23/2014 at 700am  . fluticasone (FLONASE) 50 MCG/ACT nasal spray Place 2 sprays into both nostrils 2 (two) times daily as needed for allergies or rhinitis.   08/23/2014 at 700am  . furosemide (LASIX) 20 MG tablet Take 20 mg by mouth daily.   04/01/2015  . HYDROcodone-acetaminophen  (NORCO) 5-325 MG per tablet Take 1 tablet by mouth every 6 (six) hours as needed for moderate pain. 30 tablet 0 Past Month at Unknown time  . losartan (COZAAR) 100 MG tablet Take 100 mg by mouth daily.   04/01/2015  . omeprazole (PRILOSEC) 20 MG capsule Take 20 mg by mouth daily.   Past Week at 600am  . traMADol (ULTRAM) 50 MG tablet Take 50 mg by mouth 2 (two) times daily as needed.   Past Week at Unknown time  . traZODone (DESYREL) 100 MG tablet Take 100 mg by mouth at bedtime.   Past Week at Unknown time     Allergies  Allergen Reactions  . Codeine Itching  . Erythromycin Other (See Comments)    Stomach cramps  . Penicillins Rash     Past Medical History  Diagnosis Date  . Hypertension   . GERD (gastroesophageal reflux disease)   . Arthritis   . Diverticulosis   . PONV (postoperative nausea and vomiting)   . SUI (stress urinary incontinence, female)   . PONV (postoperative nausea and vomiting)   . Depression   . Diabetes mellitus without complication ([place])     Review of systems:      Physical Exam    Heart and lungs: Regular rate and rhythm without rub or gallop, lungs are bilaterally clear.    HEENT: Normocephalic atraumatic  eyes are anicteric    Other:     Pertinant exam for procedure: Off nontender nondistended bowel sounds positive normoactive    Planned proceedures: Colonoscopy with indicated procedures. I have discussed the risks benefits and complications of procedures to include not limited to bleeding, infection, perforation and the risk of sedation and the patient wishes to proceed.    Tiffany Sails, MD Gastroenterology 04/05/2015  9:49 AM

## 2015-04-05 NOTE — Anesthesia Preprocedure Evaluation (Signed)
Anesthesia Evaluation  Patient identified by MRN, date of birth, ID band Patient awake    Reviewed: Allergy & Precautions, H&P , NPO status , Patient's Chart, lab work & pertinent test results  History of Anesthesia Complications (+) PONV and history of anesthetic complications  Airway Mallampati: III  TM Distance: >3 FB Neck ROM: limited    Dental  (+) Poor Dentition, Chipped, Missing, Partial Lower, Partial Upper   Pulmonary neg pulmonary ROS, neg shortness of breath,    Pulmonary exam normal breath sounds clear to auscultation       Cardiovascular Exercise Tolerance: Good hypertension, (-) angina(-) Past MI and (-) DOE Normal cardiovascular exam Rhythm:regular Rate:Normal     Neuro/Psych PSYCHIATRIC DISORDERS Depression negative neurological ROS     GI/Hepatic Neg liver ROS, GERD  Controlled,  Endo/Other  diabetes, Type 2, Oral Hypoglycemic Agents  Renal/GU negative Renal ROS  negative genitourinary   Musculoskeletal  (+) Arthritis ,   Abdominal   Peds  Hematology negative hematology ROS (+)   Anesthesia Other Findings Past Medical History:   Hypertension                                                 GERD (gastroesophageal reflux disease)                       Arthritis                                                    Diverticulosis                                               PONV (postoperative nausea and vomiting)                     SUI (stress urinary incontinence, female)                    PONV (postoperative nausea and vomiting)                     Depression                                                   Diabetes mellitus without complication (HCC)                Past Surgical History:   ABDOMINAL HYSTERECTOMY                                        CARPAL TUNNEL RELEASE                           Left              fractured wrist  Right              CESAREAN  SECTION                                N/A              FINGER ARTHRODESIS                              Right 07/12/2014       Comment:Procedure: Right middle finger DIP fussion;                Surgeon: Hessie Knows, MD;  Location: [facility] ORS;              Service: Orthopedics;  Laterality: Right;   COLONOSCOPY                                     N/A 08/24/2014      Comment:Procedure: COLONOSCOPY;  Surgeon: Lollie Sails, MD;  Location: [facility] ENDOSCOPY;                Service: Endoscopy;  Laterality: N/A;   BLADDER SUSPENSION                                             endocervical curettage                                       TUBAL LIGATION                                                COLPORRHAPHY                                                  SEPTOPLASTY                                                  BMI    Body Mass Index   36.60 kg/m 2    Signs and symptoms suggestive of sleep apnea    Reproductive/Obstetrics negative OB ROS                             Anesthesia Physical Anesthesia Plan  ASA: III  Anesthesia Plan: General   Post-op Pain Management:    Induction:   Airway Management Planned:   Additional Equipment:   Intra-op Plan:   Post-operative Plan:   Informed Consent: I have reviewed the patients History and Physical, chart, labs and discussed the procedure including the risks, benefits and alternatives for the proposed anesthesia with  the patient or authorized representative who has indicated his/her understanding and acceptance.   Dental Advisory Given  Plan Discussed with: Anesthesiologist, CRNA and Surgeon  Anesthesia Plan Comments:         Anesthesia Quick Evaluation

## 2015-04-05 NOTE — Transfer of Care (Signed)
Immediate Anesthesia Transfer of Care Note  Patient: Tiffany Alexander  Procedure(s) Performed: Procedure(s): COLONOSCOPY WITH PROPOFOL (N/A)  Patient Location: PACU  Anesthesia Type:General  Level of Consciousness: sedated  Airway & Oxygen Therapy: Patient Spontanous Breathing and Patient connected to nasal cannula oxygen  Post-op Assessment: Report given to RN and Post -op Vital signs reviewed and stable  Post vital signs: Reviewed and stable  Last Vitals:  Filed Vitals:   04/05/15 0912  BP: 153/65  Pulse: 82  Temp: 123XX123 C    Complications: No apparent anesthesia complications

## 2015-04-05 NOTE — Anesthesia Postprocedure Evaluation (Signed)
Anesthesia Post Note  Patient: Tiffany Alexander  Procedure(s) Performed: Procedure(s) (LRB): COLONOSCOPY WITH PROPOFOL (N/A)  Patient location during evaluation: Endoscopy Anesthesia Type: General Level of consciousness: awake and alert Pain management: pain level controlled Vital Signs Assessment: post-procedure vital signs reviewed and stable Respiratory status: spontaneous breathing, nonlabored ventilation, respiratory function stable and patient connected to nasal cannula oxygen Cardiovascular status: blood pressure returned to baseline and stable Postop Assessment: no signs of nausea or vomiting Anesthetic complications: no    Last Vitals:  Filed Vitals:   04/05/15 1105 04/05/15 1110  BP: 133/74 133/61  Pulse: 82 83  Temp:    Resp: 17 16    Last Pain: There were no vitals filed for this visit.               Precious Haws Piscit[place]

## 2015-04-05 NOTE — Anesthesia Procedure Notes (Signed)
Date/Time: 04/05/2015 10:00 AM Performed by: Johnna Acosta Pre-anesthesia Checklist: Patient identified, Emergency Drugs available, Suction available, Patient being monitored and Timeout performed Patient Re-evaluated:Patient Re-evaluated prior to inductionOxygen Delivery Method: Nasal cannula

## 2015-04-05 NOTE — Op Note (Signed)
[facility] Gastroenterology Patient Name: Tiffany Alexander Procedure Date: 04/05/2015 9:56 AM MRN: [id number] Account #: [id number] Date of Birth: 04-Sep-1945 Admit Type: Outpatient Age: 70 Room: [facility] ENDO ROOM 3 Gender: Female Note Status: Finalized Procedure:         Colonoscopy Indications:       Family history of colonic polyps in a first-degree relative Providers:         Lollie Sails, MD Referring MD:      Irven Easterly. Kary Kos, MD (Referring MD) Medicines:         Monitored Anesthesia Care Complications:     No immediate complications. Procedure:         Pre-Anesthesia Assessment:                    - ASA Grade Assessment: III - A patient with severe                     systemic disease.                    After obtaining informed consent, the colonoscope was                     passed under direct vision. Throughout the procedure, the                     patient's blood pressure, pulse, and oxygen saturations                     were monitored continuously. The Colonoscope was                     introduced through the anus and advanced to the the cecum,                     identified by appendiceal orifice and ileocecal valve. The                     colonoscopy was performed with moderate difficulty due to                     poor bowel prep and a tortuous colon. Successful                     completion of the procedure was aided by changing the                     patient to a supine position, applying abdominal pressure                     and lavage. The quality of the bowel preparation was fair. Findings:      Multiple small and large-mouthed diverticula were found in the sigmoid       colon, in the descending colon, in the transverse colon and in the       ascending colon.      The retroflexed view of the distal rectum and anal verge was normal and       showed no anal or rectal abnormalities.      The digital rectal exam was normal. Pertinent  negatives include note       anal pillar.      The exam was otherwise without abnormality. Impression:        - Diverticulosis in the sigmoid  colon, in the descending                     colon, in the transverse colon and in the ascending colon.                    - The distal rectum and anal verge are normal on                     retroflexion view.                    - The examination was otherwise normal.                    - No specimens collected. Recommendation:    - Repeat colonoscopy in 5 years for screening purposes. Procedure Code(s): --- Professional ---                    [phone], Colonoscopy, flexible; diagnostic, including                     collection of specimen(s) by brushing or washing, when                     performed (separate procedure) Diagnosis Code(s): --- Professional ---                    Z83.71, Family history of colonic polyps                    K57.30, Diverticulosis of large intestine without                     perforation or abscess without bleeding CPT copyright 2014 American Medical Association. All rights reserved. The codes documented in this report are preliminary and upon coder review may  be revised to meet current compliance requirements. Lollie Sails, MD 04/05/2015 10:39:38 AM This report has been signed electronically. Number of Addenda: 0 Note Initiated On: 04/05/2015 9:56 AM Scope Withdrawal Time: 0 hours 5 minutes 38 seconds  Total Procedure Duration: 0 hours 23 minutes 40 seconds       [facility]

## 2015-04-06 ENCOUNTER — Encounter: Payer: Self-pay | Admitting: Gastroenterology

## 2015-05-14 ENCOUNTER — Ambulatory Visit: Payer: Medicare HMO | Admitting: Anesthesiology

## 2015-05-14 ENCOUNTER — Ambulatory Visit: Payer: Medicare HMO

## 2015-05-14 ENCOUNTER — Ambulatory Visit
Admission: RE | Admit: 2015-05-14 | Discharge: 2015-05-14 | Disposition: A | Discharge status: Home / Self Care | Payer: Medicare HMO | Source: Ambulatory Visit | Attending: Orthopedic Surgery | Admitting: Orthopedic Surgery

## 2015-05-14 ENCOUNTER — Encounter
Admission: RE | Disposition: A | Discharge status: Home / Self Care | Payer: Self-pay | Source: Ambulatory Visit | Attending: Orthopedic Surgery

## 2015-05-14 ENCOUNTER — Encounter: Payer: Self-pay | Admitting: *Deleted

## 2015-05-14 DIAGNOSIS — K5790 Diverticulosis of intestine, part unspecified, without perforation or abscess without bleeding: Secondary | ICD-10-CM | POA: Insufficient documentation

## 2015-05-14 DIAGNOSIS — Z880 Allergy status to penicillin: Secondary | ICD-10-CM | POA: Diagnosis not present

## 2015-05-14 DIAGNOSIS — F329 Major depressive disorder, single episode, unspecified: Secondary | ICD-10-CM | POA: Insufficient documentation

## 2015-05-14 DIAGNOSIS — Z7984 Long term (current) use of oral hypoglycemic drugs: Secondary | ICD-10-CM | POA: Diagnosis not present

## 2015-05-14 DIAGNOSIS — Z87440 Personal history of urinary (tract) infections: Secondary | ICD-10-CM | POA: Insufficient documentation

## 2015-05-14 DIAGNOSIS — G5601 Carpal tunnel syndrome, right upper limb: Secondary | ICD-10-CM | POA: Diagnosis not present

## 2015-05-14 DIAGNOSIS — I10 Essential (primary) hypertension: Secondary | ICD-10-CM | POA: Insufficient documentation

## 2015-05-14 DIAGNOSIS — Z9889 Other specified postprocedural states: Secondary | ICD-10-CM | POA: Insufficient documentation

## 2015-05-14 DIAGNOSIS — E119 Type 2 diabetes mellitus without complications: Secondary | ICD-10-CM | POA: Insufficient documentation

## 2015-05-14 DIAGNOSIS — M1990 Unspecified osteoarthritis, unspecified site: Secondary | ICD-10-CM | POA: Insufficient documentation

## 2015-05-14 DIAGNOSIS — Z79899 Other long term (current) drug therapy: Secondary | ICD-10-CM | POA: Insufficient documentation

## 2015-05-14 DIAGNOSIS — Z7951 Long term (current) use of inhaled steroids: Secondary | ICD-10-CM | POA: Diagnosis not present

## 2015-05-14 DIAGNOSIS — Z808 Family history of malignant neoplasm of other organs or systems: Secondary | ICD-10-CM | POA: Insufficient documentation

## 2015-05-14 DIAGNOSIS — S62609A Fracture of unspecified phalanx of unspecified finger, initial encounter for closed fracture: Secondary | ICD-10-CM

## 2015-05-14 DIAGNOSIS — S62615A Displaced fracture of proximal phalanx of left ring finger, initial encounter for closed fracture: Secondary | ICD-10-CM | POA: Insufficient documentation

## 2015-05-14 DIAGNOSIS — Z90710 Acquired absence of both cervix and uterus: Secondary | ICD-10-CM | POA: Insufficient documentation

## 2015-05-14 DIAGNOSIS — Z7982 Long term (current) use of aspirin: Secondary | ICD-10-CM | POA: Insufficient documentation

## 2015-05-14 DIAGNOSIS — Z881 Allergy status to other antibiotic agents status: Secondary | ICD-10-CM | POA: Diagnosis not present

## 2015-05-14 DIAGNOSIS — Z419 Encounter for procedure for purposes other than remedying health state, unspecified: Secondary | ICD-10-CM

## 2015-05-14 DIAGNOSIS — Z8249 Family history of ischemic heart disease and other diseases of the circulatory system: Secondary | ICD-10-CM | POA: Diagnosis not present

## 2015-05-14 DIAGNOSIS — W19XXXA Unspecified fall, initial encounter: Secondary | ICD-10-CM | POA: Diagnosis not present

## 2015-05-14 DIAGNOSIS — Z885 Allergy status to narcotic agent status: Secondary | ICD-10-CM | POA: Insufficient documentation

## 2015-05-14 DIAGNOSIS — Z833 Family history of diabetes mellitus: Secondary | ICD-10-CM | POA: Insufficient documentation

## 2015-05-14 HISTORY — PX: OPEN REDUCTION INTERNAL FIXATION (ORIF) METACARPAL: SHX6234

## 2015-05-14 HISTORY — PX: CARPAL TUNNEL RELEASE: SHX101

## 2015-05-14 LAB — GLUCOSE, CAPILLARY
GLUCOSE-CAPILLARY: 110 mg/dL — AB (ref 65–99)
Glucose-Capillary: 111 mg/dL — ABNORMAL HIGH (ref 65–99)

## 2015-05-14 LAB — POCT I-STAT 4, (NA,K, GLUC, HGB,HCT)
GLUCOSE: 110 mg/dL — AB (ref 65–99)
HEMATOCRIT: 40 % (ref 36.0–46.0)
HEMOGLOBIN: 13.6 g/dL (ref 12.0–15.0)
POTASSIUM: 4.2 mmol/L (ref 3.5–5.1)
Sodium: 141 mmol/L (ref 135–145)

## 2015-05-14 SURGERY — CARPAL TUNNEL RELEASE
Anesthesia: General | Site: Wrist | Laterality: Right | Wound class: Clean

## 2015-05-14 MED ORDER — PHENYLEPHRINE HCL 10 MG/ML IJ SOLN
INTRAMUSCULAR | Status: DC | PRN
  Administered 2015-05-14 (×3): 100 ug via INTRAVENOUS

## 2015-05-14 MED ORDER — HYDROCODONE-ACETAMINOPHEN 5-325 MG PO TABS: 1.0000 | ORAL_TABLET | Freq: Four times a day (QID) | ORAL | Status: DC | PRN

## 2015-05-14 MED ORDER — IPRATROPIUM-ALBUTEROL 0.5-2.5 (3) MG/3ML IN SOLN
3.0000 mL | Freq: Once | RESPIRATORY_TRACT | Status: AC
  Administered 2015-05-14: 3 mL via RESPIRATORY_TRACT

## 2015-05-14 MED ORDER — DEXAMETHASONE SODIUM PHOSPHATE 10 MG/ML IJ SOLN
INTRAMUSCULAR | Status: DC | PRN
  Administered 2015-05-14: 5 mg via INTRAVENOUS

## 2015-05-14 MED ORDER — CLINDAMYCIN PHOSPHATE 600 MG/50ML IV SOLN
INTRAVENOUS | Status: DC | PRN
  Administered 2015-05-14: 600 mg via INTRAVENOUS

## 2015-05-14 MED ORDER — OXYCODONE HCL 5 MG/5ML PO SOLN: 5.0000 mg | Freq: Once | ORAL | Status: DC | PRN

## 2015-05-14 MED ORDER — FENTANYL CITRATE (PF) 100 MCG/2ML IJ SOLN: 25.0000 ug | INTRAMUSCULAR | Status: DC | PRN

## 2015-05-14 MED ORDER — OXYCODONE HCL 5 MG PO TABS: 5.0000 mg | ORAL_TABLET | Freq: Once | ORAL | Status: DC | PRN

## 2015-05-14 MED ORDER — IPRATROPIUM-ALBUTEROL 0.5-2.5 (3) MG/3ML IN SOLN
RESPIRATORY_TRACT | Status: AC
  Administered 2015-05-14: 3 mL via RESPIRATORY_TRACT
  Filled 2015-05-14: qty 3

## 2015-05-14 MED ORDER — GLYCOPYRROLATE 0.2 MG/ML IJ SOLN
INTRAMUSCULAR | Status: DC | PRN
  Administered 2015-05-14: 0.2 mg via INTRAVENOUS

## 2015-05-14 MED ORDER — FENTANYL CITRATE (PF) 100 MCG/2ML IJ SOLN
INTRAMUSCULAR | Status: DC | PRN
  Administered 2015-05-14: 50 ug via INTRAVENOUS
  Administered 2015-05-14: 100 ug via INTRAVENOUS
  Administered 2015-05-14 (×2): 50 ug via INTRAVENOUS

## 2015-05-14 MED ORDER — BUPIVACAINE HCL (PF) 0.5 % IJ SOLN
INTRAMUSCULAR | Status: AC
  Filled 2015-05-14: qty 30

## 2015-05-14 MED ORDER — BUPIVACAINE HCL 0.5 % IJ SOLN
INTRAMUSCULAR | Status: DC | PRN
  Administered 2015-05-14: 10 mL

## 2015-05-14 MED ORDER — NEOMYCIN-POLYMYXIN B GU 40-200000 IR SOLN
Status: AC
  Filled 2015-05-14: qty 2

## 2015-05-14 MED ORDER — LIDOCAINE HCL (PF) 2 % IJ SOLN
INTRAMUSCULAR | Status: DC | PRN
  Administered 2015-05-14: 100 mg

## 2015-05-14 MED ORDER — NEOMYCIN-POLYMYXIN B GU 40-200000 IR SOLN
Status: DC | PRN
  Administered 2015-05-14: 2 mL

## 2015-05-14 MED ORDER — MIDAZOLAM HCL 5 MG/5ML IJ SOLN
INTRAMUSCULAR | Status: DC | PRN
  Administered 2015-05-14: 2 mg via INTRAVENOUS

## 2015-05-14 MED ORDER — CLINDAMYCIN PHOSPHATE 600 MG/50ML IV SOLN
INTRAVENOUS | Status: AC
  Filled 2015-05-14: qty 50

## 2015-05-14 MED ORDER — ONDANSETRON HCL 4 MG/2ML IJ SOLN
INTRAMUSCULAR | Status: DC | PRN
  Administered 2015-05-14: 4 mg via INTRAVENOUS

## 2015-05-14 MED ORDER — SODIUM CHLORIDE 0.9 % IV SOLN
INTRAVENOUS | Status: DC
  Administered 2015-05-14 (×2): via INTRAVENOUS

## 2015-05-14 MED ORDER — PROPOFOL 10 MG/ML IV BOLUS
INTRAVENOUS | Status: DC | PRN
  Administered 2015-05-14: 150 mg via INTRAVENOUS

## 2015-05-14 SURGICAL SUPPLY — 50 items
BANDAGE ACE 3X5.8 VEL STRL LF (GAUZE/BANDAGES/DRESSINGS) ×3 IMPLANT
BANDAGE ELASTIC 4 LF NS (GAUZE/BANDAGES/DRESSINGS) ×3 IMPLANT
BIT DRILL 1.1 (BIT) ×1
BIT DRILL 60X20X1.1XQC TMX (BIT) ×2 IMPLANT
BIT DRL 60X20X1.1XQC TMX (BIT) ×2
BNDG COHESIVE 1X5 TAN NS LF (GAUZE/BANDAGES/DRESSINGS) ×3 IMPLANT
BNDG COHESIVE 4X5 TAN STRL (GAUZE/BANDAGES/DRESSINGS) ×3 IMPLANT
BNDG ESMARK 4X12 TAN STRL LF (GAUZE/BANDAGES/DRESSINGS) ×3 IMPLANT
BNDG GAUZE 1X2.1 STRL (MISCELLANEOUS) ×3 IMPLANT
CANISTER SUCT 1200ML W/VALVE (MISCELLANEOUS) ×3 IMPLANT
CHLORAPREP W/TINT 26ML (MISCELLANEOUS) ×3 IMPLANT
DRAPE EXTREMITY 106X87X128.5 (DRAPES) ×3 IMPLANT
DRAPE FLUOR MINI C-ARM 54X84 (DRAPES) ×3 IMPLANT
DRAPE IMP U-DRAPE 54X76 (DRAPES) ×3 IMPLANT
DRIVER BIT 1.5 (TRAUMA) ×6 IMPLANT
ELECT CAUTERY BLADE 6.4 (BLADE) ×3 IMPLANT
ELECT CAUTERY NEEDLE 2.0 MIC (NEEDLE) IMPLANT
ELECT CAUTERY NEEDLE TIP 1.0 (MISCELLANEOUS)
ELECT REM PT RETURN 9FT ADLT (ELECTROSURGICAL) ×3
ELECTRODE CAUTERY NEDL TIP 1.0 (MISCELLANEOUS) IMPLANT
ELECTRODE REM PT RTRN 9FT ADLT (ELECTROSURGICAL) ×2 IMPLANT
GAUZE PETRO XEROFOAM 1X8 (MISCELLANEOUS) ×3 IMPLANT
GAUZE SPONGE 4X4 12PLY STRL (GAUZE/BANDAGES/DRESSINGS) ×3 IMPLANT
GLOVE BIOGEL PI IND STRL 9 (GLOVE) ×2 IMPLANT
GLOVE BIOGEL PI INDICATOR 9 (GLOVE) ×1
GLOVE SURG ORTHO 9.0 STRL STRW (GLOVE) ×3 IMPLANT
GOWN SPECIALTY ULTRA XL (MISCELLANEOUS) ×3 IMPLANT
GOWN STRL REUS W/ TWL LRG LVL3 (GOWN DISPOSABLE) ×2 IMPLANT
GOWN STRL REUS W/TWL 2XL LVL3 (GOWN DISPOSABLE) ×3 IMPLANT
GOWN STRL REUS W/TWL LRG LVL3 (GOWN DISPOSABLE) ×1
KIT RM TURNOVER STRD PROC AR (KITS) ×3 IMPLANT
NEEDLE FILTER BLUNT 18X 1/2SAF (NEEDLE) ×1
NEEDLE FILTER BLUNT 18X1 1/2 (NEEDLE) ×2 IMPLANT
NEEDLE HYPO 25X1 1.5 SAFETY (NEEDLE) ×3 IMPLANT
NS IRRIG 500ML POUR BTL (IV SOLUTION) ×3 IMPLANT
PACK EXTREMITY ARMC (MISCELLANEOUS) ×3 IMPLANT
PAD CAST CTTN 4X4 STRL (SOFTGOODS) ×2 IMPLANT
PAD PREP 24X41 OB/GYN DISP (PERSONAL CARE ITEMS) ×3 IMPLANT
PADDING CAST BLEND 4X4 NS (MISCELLANEOUS) ×3 IMPLANT
PADDING CAST COTTON 4X4 STRL (SOFTGOODS) ×1
SCREW NL 1.5X12 (Screw) ×6 IMPLANT
SCREW NONIOC 1.5 14M (Screw) ×3 IMPLANT
SCREW NONIOC 1.5 16M (Screw) ×3 IMPLANT
STOCKINETTE STRL 4IN 9604848 (GAUZE/BANDAGES/DRESSINGS) ×6 IMPLANT
SUT ETHIBOND 4-0 (SUTURE) ×3 IMPLANT
SUT ETHILON 4-0 (SUTURE) ×1
SUT ETHILON 4-0 FS2 18XMFL BLK (SUTURE) ×2
SUT ETHILON 5 0 CL P 3 (SUTURE) ×3 IMPLANT
SUTURE ETHLN 4-0 FS2 18XMF BLK (SUTURE) ×2 IMPLANT
SYRINGE 10CC LL (SYRINGE) ×3 IMPLANT

## 2015-05-14 NOTE — Progress Notes (Signed)
Per Dr. Rudene Christians, verbal - ok to put IV high on carpal tunnel side (right)

## 2015-05-14 NOTE — Discharge Instructions (Addendum)
Loosen Ace wrap if fingers swell  Work on finger range of motion now AMBULATORY SURGERY  DISCHARGE INSTRUCTIONS  1) The drugs that you were given will stay in your system until tomorrow so for the next 24 hours you should not: A) Drive an automobile B) Make any legal decisions C) Drink any alcoholic beverage  2) You may resume regular meals tomorrow.  Today it is better to start with liquids and gradually work up to solid foods. You may eat anything you prefer, but it is better to start with liquids, then soup and crackers, and gradually work up to solid foods.  3) Please notify your doctor immediately if you have any unusual bleeding, trouble breathing, redness and pain at the surgery site, drainage, fever, or pain not relieved by medication.  4) Additional Instructions:  Please contact your physician with any problems or Same Day Surgery at [phone], Monday through Friday 6 am to 4 pm, or  at [facility] number at [phone].

## 2015-05-14 NOTE — Progress Notes (Signed)
poct K+ =   4.2

## 2015-05-14 NOTE — Anesthesia Preprocedure Evaluation (Signed)
Anesthesia Evaluation  Patient identified by MRN, date of birth, ID band Patient awake    Reviewed: Allergy & Precautions, H&P , NPO status , Patient's Chart, lab work & pertinent test results  History of Anesthesia Complications (+) PONV and history of anesthetic complications  Airway Mallampati: III  TM Distance: >3 FB Neck ROM: limited    Dental  (+) Poor Dentition, Chipped, Missing, Partial Lower, Partial Upper   Pulmonary neg pulmonary ROS, neg shortness of breath,    Pulmonary exam normal breath sounds clear to auscultation       Cardiovascular Exercise Tolerance: Good hypertension, (-) angina(-) Past MI and (-) DOE Normal cardiovascular exam Rhythm:regular Rate:Normal     Neuro/Psych PSYCHIATRIC DISORDERS Depression negative neurological ROS     GI/Hepatic Neg liver ROS, GERD  Controlled,  Endo/Other  diabetes, Type 2, Oral Hypoglycemic Agents  Renal/GU negative Renal ROS  negative genitourinary   Musculoskeletal  (+) Arthritis ,   Abdominal   Peds  Hematology negative hematology ROS (+)   Anesthesia Other Findings Past Medical History:   Hypertension                                                 GERD (gastroesophageal reflux disease)                       Arthritis                                                    Diverticulosis                                               PONV (postoperative nausea and vomiting)                     SUI (stress urinary incontinence, female)                    PONV (postoperative nausea and vomiting)                     Depression                                                   Diabetes mellitus without complication (HCC)                Past Surgical History:   ABDOMINAL HYSTERECTOMY                                        CARPAL TUNNEL RELEASE                           Left              fractured wrist  Right              CESAREAN  SECTION                                N/A              FINGER ARTHRODESIS                              Right 07/12/2014       Comment:Procedure: Right middle finger DIP fussion;                Surgeon: Hessie Knows, MD;  Location: [facility] ORS;              Service: Orthopedics;  Laterality: Right;   COLONOSCOPY                                     N/A 08/24/2014      Comment:Procedure: COLONOSCOPY;  Surgeon: Lollie Sails, MD;  Location: [facility] ENDOSCOPY;                Service: Endoscopy;  Laterality: N/A;   BLADDER SUSPENSION                                             endocervical curettage                                       TUBAL LIGATION                                                COLPORRHAPHY                                                  SEPTOPLASTY                                                  BMI    Body Mass Index   36.60 kg/m 2    Signs and symptoms suggestive of sleep apnea    Reproductive/Obstetrics negative OB ROS                             Anesthesia Physical  Anesthesia Plan  ASA: III  Anesthesia Plan: General LMA   Post-op Pain Management:    Induction:   Airway Management Planned:   Additional Equipment:   Intra-op Plan:   Post-operative Plan:   Informed Consent: I have reviewed the patients History and Physical, chart, labs and discussed the procedure including the risks, benefits and alternatives for the proposed  anesthesia with the patient or authorized representative who has indicated his/her understanding and acceptance.   Dental Advisory Given  Plan Discussed with: Anesthesiologist, CRNA and Surgeon  Anesthesia Plan Comments:         Anesthesia Quick Evaluation

## 2015-05-14 NOTE — H&P (Signed)
Reviewed paper H+P, will be scanned into chart. No changes noted.  

## 2015-05-14 NOTE — Progress Notes (Signed)
Acrylic overlay painted nails both hands - ok to leave nail polish in place per Kate Sable, RN

## 2015-05-14 NOTE — Anesthesia Postprocedure Evaluation (Signed)
Anesthesia Post Note  Patient: Tiffany Alexander  Procedure(s) Performed: Procedure(s) (LRB): CARPAL TUNNEL RELEASE (Right) OPEN REDUCTION INTERNAL FIXATION (ORIF) METACARPAL (Left)  Patient location during evaluation: PACU Anesthesia Type: General Level of consciousness: awake and alert Pain management: pain level controlled Vital Signs Assessment: post-procedure vital signs reviewed and stable Respiratory status: spontaneous breathing, nonlabored ventilation, respiratory function stable and patient connected to nasal cannula oxygen Cardiovascular status: blood pressure returned to baseline and stable Postop Assessment: no signs of nausea or vomiting Anesthetic complications: no    Last Vitals:  Filed Vitals:   05/14/15 1524 05/14/15 1527  BP:    Pulse: 99 98  Temp:    Resp: 17 15    Last Pain:  Filed Vitals:   05/14/15 1531  PainSc: Asleep                 Precious Haws Casimiro Lienhard

## 2015-05-14 NOTE — Progress Notes (Signed)
Duo neb given for sat 86 to 90

## 2015-05-14 NOTE — Op Note (Signed)
05/14/2015  2:27 PM  PATIENT:  Tiffany Alexander  70 y.o. female  PRE-OPERATIVE DIAGNOSIS:  right carpal tunnel syndrome and left ring proximal phalanx fracture  POST-OPERATIVE DIAGNOSIS:  right carpal tunnel syndrome, left ring finger phalanx fracture  PROCEDURE:  Procedure(s): CARPAL TUNNEL RELEASE (Right) OPEN REDUCTION INTERNAL FIXATION (ORIF) METACARPAL (Left) Reduction internal fixation left ring proximal phalanx not metacarpal  SURGEON: Laurene Footman, MD  ASSISTANTS: None   ANESTHESIA:   general   EBL:  Total I/O In: 800 [I.V.:800] Out: 0   BLOOD ADMINISTERED:none  DRAINS: none   LOCAL MEDICATIONS USED:  MARCAINE     SPECIMEN:  No Specimen  DISPOSITION OF SPECIMEN:  N/A  COUNTS:  YES  TOURNIQUET:   Esmarch right for 12 minutes, 41 minutes on left at 2 50 mmHg  IMPLANTS: Biomet 1.3 mm screw 3   DICTATION: .Dragon Dictation patient was brought to the operating room and after adequate general anesthesia was obtained both arms were prepped and draped in sterile fashion. After patient identification and timeout procedures were completed, Esmarch bandage was applied to the right hand and wrist and left wrist tourniquet. A volar approach was made in length ring metacarpal approximately 2 cm length. Transcochlear was identified and opened with a vascular hemostat underlying this to protect the underlying structures. Proximal and distal release was carried out to the level proximal to the wrist flexion crease at which point there appeared to be vascular blush to the nerve and release of compression. The wound was irrigated and closed with 4-0 nylon simple interrupted suture after infiltration of 10 cc half percent Sensorcaine for postoperative analgesia. Xeroform 4 x 4 and Ace wrap applied. Esmarch removed. The left hand clinical history a 50 mmHg an 8 mm axial approaches made to the proximal phalanx of the ring finger on the ulnar side. The fracture was exposed and a K Wire  placed across the fracture after open reduction obtained. 2 1.3 mm screws were placed across the fracture and the K wire was then removed and a third screw placed through this hole. This appeared to give good anatomic alignment in both AP and lateral projections. Was irrigated and a digital block Given after closure of the skin with 4-0 nylon in a simple interrupted fashion. Xeroform 4 x 4 Kling and Ace wrap applied  PLAN OF CARE: Discharge to home after PACU  PATIENT DISPOSITION:  PACU - hemodynamically stable.

## 2015-05-14 NOTE — Anesthesia Procedure Notes (Signed)
Procedure Name: LMA Insertion Performed by: Andria Frames Pre-anesthesia Checklist: Patient identified, Patient being monitored, Timeout performed, Emergency Drugs available and Suction available Patient Re-evaluated:Patient Re-evaluated prior to inductionOxygen Delivery Method: Circle system utilized Preoxygenation: Pre-oxygenation with 100% oxygen Intubation Type: IV induction Ventilation: Mask ventilation without difficulty LMA: LMA inserted LMA Size: 3.5 Tube type: Oral Number of attempts: 1 Placement Confirmation: positive ETCO2 and breath sounds checked- equal and bilateral Tube secured with: Tape Dental Injury: Teeth and Oropharynx as per pre-operative assessment

## 2015-05-14 NOTE — Transfer of Care (Signed)
Immediate Anesthesia Transfer of Care Note  Patient: Tiffany Alexander  Procedure(s) Performed: Procedure(s): CARPAL TUNNEL RELEASE (Right) OPEN REDUCTION INTERNAL FIXATION (ORIF) METACARPAL (Left)  Patient Location: PACU  Anesthesia Type:General  Level of Consciousness: awake and alert   Airway & Oxygen Therapy: Patient Spontanous Breathing  Post-op Assessment: Report given to RN  Post vital signs: Reviewed  Last Vitals:  Filed Vitals:   05/14/15 1110 05/14/15 1428  BP: 156/91 120/66  Pulse: 87 83  Temp: 36.6 C 36.6 C  Resp: 16 9    Complications: No apparent anesthesia complications

## 2015-05-14 NOTE — Progress Notes (Signed)
Elevated hands on pillows and applied ice pack to both hands

## 2015-05-15 ENCOUNTER — Encounter: Payer: Self-pay | Admitting: Orthopedic Surgery

## 2015-07-16 ENCOUNTER — Ambulatory Visit: Payer: Medicare HMO | Attending: Orthopedic Surgery | Admitting: Occupational Therapy

## 2015-07-16 DIAGNOSIS — M79645 Pain in left finger(s): Secondary | ICD-10-CM | POA: Diagnosis not present

## 2015-07-16 DIAGNOSIS — M25642 Stiffness of left hand, not elsewhere classified: Secondary | ICD-10-CM | POA: Insufficient documentation

## 2015-07-16 DIAGNOSIS — M6281 Muscle weakness (generalized): Secondary | ICD-10-CM | POA: Insufficient documentation

## 2015-07-16 NOTE — Therapy (Signed)
[place] [facility] [address] [place], [address] Phone: [phone]   Fax:  [phone]  Occupational Therapy Treatment  Patient Details  Name: Tiffany Alexander MRN: [id number] Date of Birth: [birth year] Referring Provider: Dr Rudene Christians  Encounter Date: 07/16/2015      OT End of Session - 07/16/15 1022    Visit Number 1   Number of Visits 4   Date for OT Re-Evaluation 08/13/15   OT Start Time 0904   OT Stop Time 1001   OT Time Calculation (min) 57 min   Activity Tolerance Patient tolerated treatment well   Behavior During Therapy [facility] for tasks assessed/performed      Past Medical History  Diagnosis Date  . Hypertension   . GERD (gastroesophageal reflux disease)   . Arthritis   . Diverticulosis   . PONV (postoperative nausea and vomiting)   . SUI (stress urinary incontinence, female)   . PONV (postoperative nausea and vomiting)   . Depression   . Diabetes mellitus without complication [facility])     Past Surgical History  Procedure Laterality Date  . Abdominal hysterectomy    . Carpal tunnel release Left   . Fractured wrist Right   . Cesarean section N/A   . Finger arthrodesis Right 07/12/2014    Procedure: Right middle finger DIP fussion;  Surgeon: Hessie Knows, MD;  Location: [facility] ORS;  Service: Orthopedics;  Laterality: Right;  . Colonoscopy N/A 08/24/2014    Procedure: COLONOSCOPY;  Surgeon: Lollie Sails, MD;  Location: [facility] ENDOSCOPY;  Service: Endoscopy;  Laterality: N/A;  . Bladder suspension    .  endocervical curettage    . Tubal ligation    . Colporrhaphy    . Septoplasty    . Colonoscopy with propofol N/A 04/05/2015    Procedure: COLONOSCOPY WITH PROPOFOL;  Surgeon: Lollie Sails, MD;  Location: [facility] ENDOSCOPY;  Service: Endoscopy;  Laterality: N/A;  . Carpal tunnel release Right 05/14/2015    Procedure: CARPAL TUNNEL RELEASE;  Surgeon: Hessie Knows, MD;  Location: [facility] ORS;  Service:  Orthopedics;  Laterality: Right;  . Open reduction internal fixation (orif) metacarpal Left 05/14/2015    Procedure: OPEN REDUCTION INTERNAL FIXATION (ORIF) METACARPAL;  Surgeon: Hessie Knows, MD;  Location: [facility] ORS;  Service: Orthopedics;  Laterality: Left;    There were no vitals filed for this visit.      Subjective Assessment - 07/16/15 1013    Subjective  I fell and fractured my L 4th finger - and Dr Rudene Christians did my surgery and at same time did my CTR on R hand - that is doing good but my finger do not want to go straight all the way or bend - as well as pain mostly when hitting it - but not using it much - I am R handed    Patient Stated Goals Want my finger to be more straight, make fist better - and wear my rings   Currently in Pain? Yes   Pain Score 2    Pain Orientation Left   Pain Descriptors / Indicators Aching   Pain Type Surgical pain            [facility] OT Assessment - 07/16/15 0001    Assessment   Diagnosis L 4th proximal phalnages ORIF   Referring Provider Dr Rudene Christians   Onset Date 05/14/15   Home  Environment   Lives With Spouse   Prior Function   Level of Independence Independent  Vocation Retired   Leisure R hand dominant- do won house work , cooking , reading , active in church    Edema   Edema R 4th PIP 5.7 cm ; L 6.6 cm    Strength   Right Hand Grip (lbs) 30   Right Hand Lateral Pinch 14 lbs   Right Hand 3 Point Pinch 13 lbs   Left Hand Grip (lbs) 25   Left Hand Lateral Pinch 13 lbs   Left Hand 3 Point Pinch 9 lbs   Left Hand AROM   L Ring  MCP 0-90 90 Degrees   L Ring PIP 0-100 70 Degrees  -45   L Ring DIP 0-70 35 Degrees       Paraffin done with LMB splint for PIP extention on - pt ed on using at home  Ed on HEP - pt gained AROM flexion - PROM improve for PIP extention but not able to maintain extnetion gain   fitted with buddy strap only for HEP - and with 5th to decrease crossing over of 4th  Compression sock for edema fitted and ed on use                     OT Education - 07/16/15 1021    Education provided Yes   Education Details HEP   Person(s) Educated Patient   Methods Explanation;Demonstration;Tactile cues;Verbal cues;Handout   Comprehension Verbal cues required;Returned demonstration;Verbalized understanding          OT Short Term Goals - 07/16/15 1026    OT SHORT TERM GOAL #1   Title Pt PIP extention improve with at least 10 degrees to reach into pocket   Baseline -45 at PIP    Time 3   Period Weeks   Status New   OT SHORT TERM GOAL #2   Title Flexion at PIP and DIP improve 15 degrees to hold brush, groceries    Baseline PIP 70 , DIP 35   Time 3   Period Weeks   Status New           OT Long Term Goals - 07/16/15 1028    OT LONG TERM GOAL #1   Title Pain on PRWHE improve with 15 points in L 4th digit   Baseline pain at eval on PRWHE 27/50   Time 4   Period Weeks   Status New   OT LONG TERM GOAL #2   Title Functional use of L hand improve on PRWHE by 10 points    Baseline at eval function on PRWHE 18.5/50   Time 4   Period Weeks   Status New   OT LONG TERM GOAL #3   Title Pt to be in in HEP to decrease edema ,pain and increase flexion and extention of 4th PIP extention    Baseline very little knowledge on HEP    Time 4   Period Weeks   Status New               Plan - 07/16/15 1022    Clinical Impression Statement  Pt present 8 wks s/p ORIF of L 4th proximal phalanges - pt show decrease extention and flexion of PIP  flexion of DIP - also radial deviation/rotation distal to PIP - pt report pain at the most 5/10 - pt did show increase flexion actively - but only increase passive ROM - but could not  do acive gains - scar tissue on ulnar side of proximal phalanges - decrease  grip bilateral becasue of CTR  same date on L - decrease 3 point grip    Rehab Potential Good   OT Frequency 1x / week   OT Duration 4 weeks   OT Treatment/Interventions  Fluidtherapy;Parrafin;Splinting;Patient/family education;Therapeutic exercises;Manual Therapy;Scar mobilization   Plan assess progress for extnetion and flexion 4th - ? deviation   OT Home Exercise Plan see pt instruction    Consulted and Agree with Plan of Care Patient      Patient will benefit from skilled therapeutic intervention in order to improve the following deficits and impairments:  Decreased coordination, Decreased range of motion, Impaired flexibility, Increased edema, Pain, Impaired UE functional use, Decreased scar mobility, Decreased strength  Visit Diagnosis: Pain in left finger(s) - Plan: Ot plan of care cert/re-cert  Stiffness of left hand, not elsewhere classified - Plan: Ot plan of care cert/re-cert  Muscle weakness (generalized) - Plan: Ot plan of care cert/re-cert      G-Codes - [id number] 1030    Functional Assessment Tool Used PRWHE , ROM , strength, edema , clnical judgement    Functional Limitation Self care   Self Care Current Status [id number]) At least 20 percent but less than 40 percent impaired, limited or restricted   Self Care Goal Status [id number]) At least 1 percent but less than 20 percent impaired, limited or restricted      Problem List There are no active problems to display for this patient.   Rosalyn Gess OTR/L,CLT  07/16/2015, 10:33 AM  [place] [facility] [address] [place], [address] Phone: [phone]   Fax:  [phone]  Name: Tiffany Alexander MRN: [id number] Date of Birth: 1945/04/25

## 2015-07-16 NOTE — Patient Instructions (Signed)
Pt to do heat with LMB PIP extention splint on 10 min  Ed on adjusting , wearing   PROM for PIP extnetion and AROM  Rolling putty fro extention  Flexion PROM DIP with PIP extended  PIP and DIP flexion blocked AROM  Tendon glides - with buddy strap on 4th and 5th to  decrease radial deviation of middle phalanges  10 reps all  2 x day   Keep pain under 1-2/10  And wear digi sleeve for edema

## 2015-07-23 ENCOUNTER — Ambulatory Visit: Payer: Medicare HMO | Admitting: Occupational Therapy

## 2015-07-29 ENCOUNTER — Ambulatory Visit: Payer: Medicare HMO | Admitting: Occupational Therapy

## 2015-07-29 DIAGNOSIS — M6281 Muscle weakness (generalized): Secondary | ICD-10-CM

## 2015-07-29 DIAGNOSIS — M25642 Stiffness of left hand, not elsewhere classified: Secondary | ICD-10-CM

## 2015-07-29 DIAGNOSIS — M79645 Pain in left finger(s): Secondary | ICD-10-CM | POA: Diagnosis not present

## 2015-07-29 NOTE — Patient Instructions (Signed)
  DIP/PIP strap fabricated and wear on 4th  for 3 min  Blocked intrinsic  full fist to palm   Pt to do HEP separate for flexion and extention

## 2015-07-29 NOTE — Therapy (Signed)
[place] [facility] [address] [place], [address] Phone: [phone]   Fax:  [phone]  Occupational Therapy Treatment  Patient Details  Name: DANITRA SWAREY MRN: [id number] Date of Birth: September 01, 1945 Referring Provider: Dr Rudene Christians  Encounter Date: 07/29/2015      OT End of Session - 07/29/15 0829    Visit Number 2   Number of Visits 4   Date for OT Re-Evaluation 08/13/15   OT Start Time 0811   OT Stop Time 0855   OT Time Calculation (min) 44 min   Activity Tolerance Patient tolerated treatment well   Behavior During Therapy [name] for tasks assessed/performed      Past Medical History  Diagnosis Date  . Hypertension   . GERD (gastroesophageal reflux disease)   . Arthritis   . Diverticulosis   . PONV (postoperative nausea and vomiting)   . SUI (stress urinary incontinence, female)   . PONV (postoperative nausea and vomiting)   . Depression   . Diabetes mellitus without complication [facility])     Past Surgical History  Procedure Laterality Date  . Abdominal hysterectomy    . Carpal tunnel release Left   . Fractured wrist Right   . Cesarean section N/A   . Finger arthrodesis Right 07/12/2014    Procedure: Right middle finger DIP fussion;  Surgeon: Hessie Knows, MD;  Location: [facility] ORS;  Service: Orthopedics;  Laterality: Right;  . Colonoscopy N/A 08/24/2014    Procedure: COLONOSCOPY;  Surgeon: Lollie Sails, MD;  Location: [facility] ENDOSCOPY;  Service: Endoscopy;  Laterality: N/A;  . Bladder suspension    .  endocervical curettage    . Tubal ligation    . Colporrhaphy    . Septoplasty    . Colonoscopy with propofol N/A 04/05/2015    Procedure: COLONOSCOPY WITH PROPOFOL;  Surgeon: Lollie Sails, MD;  Location: [facility] ENDOSCOPY;  Service: Endoscopy;  Laterality: N/A;  . Carpal tunnel release Right 05/14/2015    Procedure: CARPAL TUNNEL RELEASE;  Surgeon: Hessie Knows, MD;  Location: [facility] ORS;  Service:  Orthopedics;  Laterality: Right;  . Open reduction internal fixation (orif) metacarpal Left 05/14/2015    Procedure: OPEN REDUCTION INTERNAL FIXATION (ORIF) METACARPAL;  Surgeon: Hessie Knows, MD;  Location: [facility] ORS;  Service: Orthopedics;  Laterality: Left;    There were no vitals filed for this visit.      Subjective Assessment - 07/29/15 0818    Subjective  Swelling is worse- I can straignthen in on my leg- trying to use it more - pain is better    Patient Stated Goals Want my finger to be more straight, make fist better - and wear my rings   Currently in Pain? No/denies            [facility] OT Assessment - 07/29/15 0001    Strength   Right Hand Grip (lbs) 30   Right Hand Lateral Pinch 14 lbs   Right Hand 3 Point Pinch 13 lbs   Left Hand Grip (lbs) 30   Left Hand Lateral Pinch 13 lbs   Left Hand 3 Point Pinch 10 lbs   Left Hand AROM   L Ring  MCP 0-90 90 Degrees   L Ring PIP 0-100 70 Degrees  -35   L Ring DIP 0-70 50 Degrees                  OT Treatments/Exercises (OP) - 07/29/15 0001    LUE Paraffin  Number Minutes Paraffin 10 Minutes   LUE Paraffin Location Hand   Comments Hand in fist - wrapped with heatinpad at [facility]       PROM for DIP and PIP flexion  DIP/PIP strap fabricated and wear on 4th  for 3 min  Blocked intrinsic fist blocked  And then made strap tighter and pt ed on using at home for 3 min   tendon glides again - with focus on intrinsic fist   full fist to palm   Pt to do HEP separate for flexion and extention  - rolling putty  PROM extention on table            OT Education - 07/29/15 0820    Education provided Yes   Education Details HEP   Person(s) Educated Patient   Methods Explanation;Demonstration;Tactile cues;Verbal cues   Comprehension Verbal cues required;Returned demonstration;Verbalized understanding          OT Short Term Goals - 07/16/15 1026    OT SHORT TERM GOAL #1   Title Pt PIP extention improve with at  least 10 degrees to reach into pocket   Baseline -45 at PIP    Time 3   Period Weeks   Status New   OT SHORT TERM GOAL #2   Title Flexion at PIP and DIP improve 15 degrees to hold brush, groceries    Baseline PIP 70 , DIP 35   Time 3   Period Weeks   Status New           OT Long Term Goals - 07/16/15 1028    OT LONG TERM GOAL #1   Title Pain on PRWHE improve with 15 points in L 4th digit   Baseline pain at eval on PRWHE 27/50   Time 4   Period Weeks   Status New   OT LONG TERM GOAL #2   Title Functional use of L hand improve on PRWHE by 10 points    Baseline at eval function on PRWHE 18.5/50   Time 4   Period Weeks   Status New   OT LONG TERM GOAL #3   Title Pt to be in in HEP to decrease edema ,pain and increase flexion and extention of 4th PIP extention    Baseline very little knowledge on HEP    Time 4   Period Weeks   Status New               Plan - 07/29/15 0830    Clinical Impression Statement Pt's extention at PIP improve and DIP flexion - but flexion at PIP did not improve - pt ed on HEP and use of DIP/PIP strap at home - and seperating flexion and extention HEP  - to increase Flexion without loosing extention progress    Rehab Potential Good   OT Frequency 1x / week   OT Duration 4 weeks   OT Treatment/Interventions Fluidtherapy;Parrafin;Splinting;Patient/family education;Therapeutic exercises;Manual Therapy;Scar mobilization   Plan assess flexion progress-  and if cont with progress in extention    OT Home Exercise Plan see pt instruction    Consulted and Agree with Plan of Care Patient      Patient will benefit from skilled therapeutic intervention in order to improve the following deficits and impairments:  Decreased coordination, Decreased range of motion, Impaired flexibility, Increased edema, Pain, Impaired UE functional use, Decreased scar mobility, Decreased strength  Visit Diagnosis: Pain in left finger(s)  Stiffness of left hand, not  elsewhere classified  Muscle weakness (  generalized)    Problem List There are no active problems to display for this patient.   Rosalyn Gess OTR/L,CLT  07/29/2015, 9:26 AM  [place] [facility] [address] [place], [address] Phone: [phone]   Fax:  [phone]  Name: KHADJAH GLADNEY MRN: [id number] Date of Birth: 11-12-1945

## 2015-09-17 ENCOUNTER — Encounter: Admission: RE | Payer: Self-pay | Source: Ambulatory Visit

## 2015-09-17 ENCOUNTER — Ambulatory Visit: Admission: RE | Admit: 2015-09-17 | Payer: Medicare HMO | Source: Ambulatory Visit | Admitting: Ophthalmology

## 2015-09-17 SURGERY — PHACOEMULSIFICATION, CATARACT, WITH IOL INSERTION
Anesthesia: Choice | Laterality: Right

## 2015-12-16 ENCOUNTER — Encounter: Payer: Self-pay | Admitting: *Deleted

## 2015-12-17 ENCOUNTER — Ambulatory Visit: Payer: Medicare HMO | Admitting: Anesthesiology

## 2015-12-17 ENCOUNTER — Ambulatory Visit
Admission: RE | Admit: 2015-12-17 | Discharge: 2015-12-17 | Disposition: A | Discharge status: Home / Self Care | Payer: Medicare HMO | Source: Ambulatory Visit | Attending: Ophthalmology | Admitting: Ophthalmology

## 2015-12-17 ENCOUNTER — Encounter: Payer: Self-pay | Admitting: *Deleted

## 2015-12-17 ENCOUNTER — Encounter
Admission: RE | Disposition: A | Discharge status: Home / Self Care | Payer: Self-pay | Source: Ambulatory Visit | Attending: Ophthalmology

## 2015-12-17 DIAGNOSIS — K5790 Diverticulosis of intestine, part unspecified, without perforation or abscess without bleeding: Secondary | ICD-10-CM | POA: Diagnosis not present

## 2015-12-17 DIAGNOSIS — Z885 Allergy status to narcotic agent status: Secondary | ICD-10-CM | POA: Insufficient documentation

## 2015-12-17 DIAGNOSIS — E669 Obesity, unspecified: Secondary | ICD-10-CM | POA: Insufficient documentation

## 2015-12-17 DIAGNOSIS — I10 Essential (primary) hypertension: Secondary | ICD-10-CM | POA: Insufficient documentation

## 2015-12-17 DIAGNOSIS — Z90710 Acquired absence of both cervix and uterus: Secondary | ICD-10-CM | POA: Insufficient documentation

## 2015-12-17 DIAGNOSIS — M1990 Unspecified osteoarthritis, unspecified site: Secondary | ICD-10-CM | POA: Insufficient documentation

## 2015-12-17 DIAGNOSIS — K219 Gastro-esophageal reflux disease without esophagitis: Secondary | ICD-10-CM | POA: Insufficient documentation

## 2015-12-17 DIAGNOSIS — Z881 Allergy status to other antibiotic agents status: Secondary | ICD-10-CM | POA: Insufficient documentation

## 2015-12-17 DIAGNOSIS — H2511 Age-related nuclear cataract, right eye: Secondary | ICD-10-CM | POA: Diagnosis present

## 2015-12-17 DIAGNOSIS — F329 Major depressive disorder, single episode, unspecified: Secondary | ICD-10-CM | POA: Diagnosis not present

## 2015-12-17 DIAGNOSIS — E1136 Type 2 diabetes mellitus with diabetic cataract: Secondary | ICD-10-CM | POA: Insufficient documentation

## 2015-12-17 DIAGNOSIS — Z880 Allergy status to penicillin: Secondary | ICD-10-CM | POA: Insufficient documentation

## 2015-12-17 HISTORY — PX: CATARACT EXTRACTION W/PHACO: SHX586

## 2015-12-17 LAB — GLUCOSE, CAPILLARY: Glucose-Capillary: 131 mg/dL — ABNORMAL HIGH (ref 65–99)

## 2015-12-17 SURGERY — PHACOEMULSIFICATION, CATARACT, WITH IOL INSERTION
Anesthesia: Monitor Anesthesia Care | Site: Eye | Laterality: Right | Wound class: Clean

## 2015-12-17 MED ORDER — MOXIFLOXACIN HCL 0.5 % OP SOLN
1.0000 [drp] | OPHTHALMIC | Status: DC | PRN
  Administered 2015-12-17 (×3): 1 [drp] via OPHTHALMIC

## 2015-12-17 MED ORDER — ARMC OPHTHALMIC DILATING DROPS
1.0000 "application " | OPHTHALMIC | Status: AC
  Administered 2015-12-17 (×3): 1 via OPHTHALMIC
  Filled 2015-12-17: qty 0.4

## 2015-12-17 MED ORDER — POVIDONE-IODINE 5 % OP SOLN
OPHTHALMIC | Status: DC | PRN
  Administered 2015-12-17: 1 via OPHTHALMIC

## 2015-12-17 MED ORDER — ARMC OPHTHALMIC DILATING DROPS
1.0000 "application " | OPHTHALMIC | Status: AC
  Filled 2015-12-17: qty 0.4

## 2015-12-17 MED ORDER — LIDOCAINE HCL (PF) 4 % IJ SOLN
INTRAMUSCULAR | Status: AC
  Filled 2015-12-17: qty 5

## 2015-12-17 MED ORDER — MOXIFLOXACIN HCL 0.5 % OP SOLN
OPHTHALMIC | Status: AC
  Administered 2015-12-17: 1 [drp] via OPHTHALMIC
  Filled 2015-12-17: qty 3

## 2015-12-17 MED ORDER — EPINEPHRINE PF 1 MG/ML IJ SOLN
INTRAMUSCULAR | Status: DC | PRN
  Administered 2015-12-17: 1 mL via OPHTHALMIC

## 2015-12-17 MED ORDER — MIDAZOLAM HCL 5 MG/5ML IJ SOLN
INTRAMUSCULAR | Status: DC | PRN
  Administered 2015-12-17: 1 mg via INTRAVENOUS

## 2015-12-17 MED ORDER — POVIDONE-IODINE 5 % OP SOLN
OPHTHALMIC | Status: AC
  Filled 2015-12-17: qty 30

## 2015-12-17 MED ORDER — NA CHONDROIT SULF-NA HYALURON 40-17 MG/ML IO SOLN
INTRAOCULAR | Status: DC | PRN
  Administered 2015-12-17: 1 mL via INTRAOCULAR

## 2015-12-17 MED ORDER — LIDOCAINE HCL (PF) 4 % IJ SOLN
INTRAOCULAR | Status: DC | PRN
  Administered 2015-12-17: 4 mL via OPHTHALMIC

## 2015-12-17 MED ORDER — NA CHONDROIT SULF-NA HYALURON 40-17 MG/ML IO SOLN
INTRAOCULAR | Status: AC
  Filled 2015-12-17: qty 1

## 2015-12-17 MED ORDER — MOXIFLOXACIN HCL 0.5 % OP SOLN
OPHTHALMIC | Status: DC | PRN
  Administered 2015-12-17: 1 [drp] via OPHTHALMIC

## 2015-12-17 MED ORDER — EPINEPHRINE PF 1 MG/ML IJ SOLN
INTRAMUSCULAR | Status: AC
  Filled 2015-12-17: qty 2

## 2015-12-17 MED ORDER — SODIUM CHLORIDE 0.9 % IV SOLN
INTRAVENOUS | Status: DC
  Administered 2015-12-17 (×2): via INTRAVENOUS

## 2015-12-17 SURGICAL SUPPLY — 21 items
CANNULA ANT/CHMB 27GA (MISCELLANEOUS) ×2 IMPLANT
CUP MEDICINE 2OZ PLAST GRAD ST (MISCELLANEOUS) ×2 IMPLANT
GLOVE BIO SURGEON STRL SZ8 (GLOVE) ×2 IMPLANT
GLOVE BIOGEL M 6.5 STRL (GLOVE) ×2 IMPLANT
GLOVE SURG LX 8.0 MICRO (GLOVE) ×1
GLOVE SURG LX STRL 8.0 MICRO (GLOVE) ×1 IMPLANT
GOWN STRL REUS W/ TWL LRG LVL3 (GOWN DISPOSABLE) ×2 IMPLANT
GOWN STRL REUS W/TWL LRG LVL3 (GOWN DISPOSABLE) ×2
LENS IOL TECNIS ITEC 20.0 (Intraocular Lens) ×2 IMPLANT
PACK CATARACT (MISCELLANEOUS) ×2 IMPLANT
PACK CATARACT BRASINGTON LX (MISCELLANEOUS) ×2 IMPLANT
PACK EYE AFTER SURG (MISCELLANEOUS) ×2 IMPLANT
SOL BSS BAG (MISCELLANEOUS) ×2
SOL PREP PVP 2OZ (MISCELLANEOUS) ×2
SOLUTION BSS BAG (MISCELLANEOUS) ×1 IMPLANT
SOLUTION PREP PVP 2OZ (MISCELLANEOUS) ×1 IMPLANT
SYR 3ML LL SCALE MARK (SYRINGE) ×2 IMPLANT
SYR 5ML LL (SYRINGE) ×2 IMPLANT
SYR TB 1ML 27GX1/2 LL (SYRINGE) ×2 IMPLANT
WATER STERILE IRR 250ML POUR (IV SOLUTION) ×2 IMPLANT
WIPE NON LINTING 3.25X3.25 (MISCELLANEOUS) ×2 IMPLANT

## 2015-12-17 NOTE — H&P (Signed)
All labs reviewed. Abnormal studies sent to patients PCP when indicated.  Previous H&P reviewed, patient examined, there are NO CHANGES.  Tiffany Alexander [name]10/10/20178:55 AM

## 2015-12-17 NOTE — Anesthesia Postprocedure Evaluation (Signed)
Anesthesia Post Note  Patient: Tiffany Alexander  Procedure(s) Performed: Procedure(s) (LRB): CATARACT EXTRACTION PHACO AND INTRAOCULAR LENS PLACEMENT (IOC) (Right)  Patient location during evaluation: PACU Anesthesia Type: MAC Level of consciousness: oriented, patient cooperative and awake and alert Pain management: satisfactory to patient Vital Signs Assessment: post-procedure vital signs reviewed and stable Respiratory status: respiratory function stable Cardiovascular status: stable Anesthetic complications: no    Last Vitals:  Vitals:   12/17/15 0743  BP: 134/75  Pulse: 78  Resp: 18  Temp: 36.8 C    Last Pain:  Vitals:   12/17/15 0743  TempSrc: Oral                 Silvana Newness A

## 2015-12-17 NOTE — Discharge Instructions (Signed)
Eye Surgery Discharge Instructions  Expect mild scratchy sensation or mild soreness. DO NOT RUB YOUR EYE!  The day of surgery:  Minimal physical activity, but bed rest is not required  No reading, computer work, or close hand work  No bending, lifting, or straining.  May watch TV  For 24 hours:  No driving, legal decisions, or alcoholic beverages  Safety precautions  Eat anything you prefer: It is better to start with liquids, then soup then solid foods.  _____ Eye patch should be worn until postoperative exam tomorrow.  ____ Solar shield eyeglasses should be worn for comfort in the sunlight/patch while sleeping  Resume all regular medications including aspirin or Coumadin if these were discontinued prior to surgery. You may shower, bathe, shave, or wash your hair. Tylenol may be taken for mild discomfort.  Call your doctor if you experience significant pain, nausea, or vomiting, fever > 101 or other signs of infection. [phone] or [phone] Specific instructions:  Follow-up Information    Tim Lair, MD .   Specialty:  Ophthalmology Why:  October 11 at 10:10am Contact information: [place] [address] [phone]

## 2015-12-17 NOTE — Op Note (Signed)
PREOPERATIVE DIAGNOSIS:  Nuclear sclerotic cataract of the right eye.   POSTOPERATIVE DIAGNOSIS:  NUCLEAR SCLEROTIC CATARACT RIGHT EYE   OPERATIVE PROCEDURE: Procedure(s): CATARACT EXTRACTION PHACO AND INTRAOCULAR LENS PLACEMENT (IOC)   SURGEON:  Birder Robson, MD.   ANESTHESIA:  Anesthesiologist: Emmie Niemann, MD CRNA: Silvana Newness, CRNA  1.      Managed anesthesia care. 2.      Topical tetracaine drops followed by 2% Xylocaine jelly applied in the preoperative holding area.   COMPLICATIONS:  None.   TECHNIQUE:   Stop and chop   DESCRIPTION OF PROCEDURE:  The patient was examined and consented in the preoperative holding area where the aforementioned topical anesthesia was applied to the right eye and then brought back to the Operating Room where the right eye was prepped and draped in the usual sterile ophthalmic fashion and a lid speculum was placed. A paracentesis was created with the side port blade and the anterior chamber was filled with viscoelastic. A near clear corneal incision was performed with the steel keratome. A continuous curvilinear capsulorrhexis was performed with a cystotome followed by the capsulorrhexis forceps. Hydrodissection and hydrodelineation were carried out with BSS on a blunt cannula. The lens was removed in a stop and chop  technique and the remaining cortical material was removed with the irrigation-aspiration handpiece. The capsular bag was inflated with viscoelastic and the Technis [id number]  lens was placed in the capsular bag without complication. The remaining viscoelastic was removed from the eye with the irrigation-aspiration handpiece. The wounds were hydrated. The anterior chamber was flushed with Miostat and the eye was inflated to physiologic pressure. 0.2 mL of Vigamox diluted three/one with BSS was placed in the anterior chamber. The wounds were found to be water tight. The eye was dressed with Vigamox. The patient was given protective glasses to  wear throughout the day and a shield with which to sleep tonight. The patient was also given drops with which to begin a drop regimen today and will follow-up with me in one day.  Implant Name Type Inv. Item Serial No. Manufacturer Lot No. LRB No. Used  LENS IOL DIOP 20.0 - [id number] Intraocular Lens LENS IOL DIOP 20.0 [id number] AMO   Right 1   Procedure(s) with comments: CATARACT EXTRACTION PHACO AND INTRAOCULAR LENS PLACEMENT (IOC) (Right) - [place] 52.0 AP% 15.4 CDE 7.99 Fluid pack lot # [id number]  Electronically signed: [place] 12/17/2015 9:23 AM

## 2015-12-17 NOTE — Anesthesia Preprocedure Evaluation (Addendum)
Anesthesia Evaluation  Patient identified by MRN, date of birth, ID band Patient awake    Reviewed: Allergy & Precautions, NPO status , Patient's Chart, lab work & pertinent test results  History of Anesthesia Complications (+) PONV and history of anesthetic complications  Airway Mallampati: II  TM Distance: >3 FB Neck ROM: Full    Dental  (+) Edentulous Lower, Upper Dentures   Pulmonary neg pulmonary ROS, neg sleep apnea, neg COPD,    breath sounds clear to auscultation- rhonchi (-) wheezing      Cardiovascular hypertension, Pt. on medications (-) CAD and (-) Past MI  Rhythm:Regular Rate:Normal - Systolic murmurs and - Diastolic murmurs    Neuro/Psych Depression negative neurological ROS     GI/Hepatic Neg liver ROS, GERD  ,  Endo/Other  diabetes, Type 2, Oral Hypoglycemic Agents  Renal/GU negative Renal ROS     Musculoskeletal  (+) Arthritis , Osteoarthritis,    Abdominal (+) + obese,   Peds  Hematology negative hematology ROS (+)   Anesthesia Other Findings Past Medical History: No date: Arthritis No date: Depression No date: Diabetes mellitus without complication (HCC) No date: Diverticulosis No date: GERD (gastroesophageal reflux disease) No date: Hypertension No date: PONV (postoperative nausea and vomiting) No date: PONV (postoperative nausea and vomiting) No date: SUI (stress urinary incontinence, female)   Reproductive/Obstetrics                             Anesthesia Physical Anesthesia Plan  ASA: III  Anesthesia Plan: MAC   Post-op Pain Management:    Induction: Intravenous  Airway Management Planned: Natural Airway  Additional Equipment:   Intra-op Plan:   Post-operative Plan:   Informed Consent: I have reviewed the patients History and Physical, chart, labs and discussed the procedure including the risks, benefits and alternatives for the proposed  anesthesia with the patient or authorized representative who has indicated his/her understanding and acceptance.   Dental advisory given  Plan Discussed with: CRNA and Anesthesiologist  Anesthesia Plan Comments:        Anesthesia Quick Evaluation

## 2015-12-17 NOTE — Anesthesia Postprocedure Evaluation (Signed)
Anesthesia Post Note  Patient: Dailene Bradle Valiente  Procedure(s) Performed: Procedure(s) (LRB): CATARACT EXTRACTION PHACO AND INTRAOCULAR LENS PLACEMENT (IOC) (Right)  Patient location during evaluation: PACU Anesthesia Type: MAC Level of consciousness: awake and alert Pain management: pain level controlled Vital Signs Assessment: post-procedure vital signs reviewed and stable Respiratory status: spontaneous breathing, nonlabored ventilation, respiratory function stable and patient connected to nasal cannula oxygen Cardiovascular status: stable and blood pressure returned to baseline Anesthetic complications: no    Last Vitals:  Vitals:   12/17/15 0924 12/17/15 0929  BP: (!) 130/58 (!) 131/55  Pulse: 75   Resp: 17   Temp: 37 C     Last Pain:  Vitals:   12/17/15 0743  TempSrc: Oral                 Mackinze Criado

## 2015-12-17 NOTE — Transfer of Care (Signed)
Immediate Anesthesia Transfer of Care Note  Patient: Tiffany Alexander  Procedure(s) Performed: Procedure(s) with comments: CATARACT EXTRACTION PHACO AND INTRAOCULAR LENS PLACEMENT (IOC) (Right) - [place] 52.0 AP% 15.4 CDE 7.99 Fluid pack lot # [id number]  Patient Location: PACU  Anesthesia Type:MAC  Level of Consciousness: awake, alert , oriented and patient cooperative  Airway & Oxygen Therapy: Patient Spontanous Breathing  Post-op Assessment: Report given to RN, Post -op Vital signs reviewed and stable and Patient moving all extremities X 4  Post vital signs: Reviewed and stable  Last Vitals:  Vitals:   12/17/15 0743  BP: 134/75  Pulse: 78  Resp: 18  Temp: 36.8 C    Last Pain:  Vitals:   12/17/15 0743  TempSrc: Oral         Complications: No apparent anesthesia complications

## 2016-01-15 ENCOUNTER — Encounter: Payer: Self-pay | Admitting: *Deleted

## 2016-01-21 ENCOUNTER — Encounter
Admission: RE | Disposition: A | Discharge status: Home / Self Care | Payer: Self-pay | Source: Ambulatory Visit | Attending: Ophthalmology

## 2016-01-21 ENCOUNTER — Ambulatory Visit
Admission: RE | Admit: 2016-01-21 | Discharge: 2016-01-21 | Disposition: A | Discharge status: Home / Self Care | Payer: Medicare HMO | Source: Ambulatory Visit | Attending: Ophthalmology | Admitting: Ophthalmology

## 2016-01-21 ENCOUNTER — Ambulatory Visit: Payer: Medicare HMO | Admitting: Certified Registered Nurse Anesthetist

## 2016-01-21 ENCOUNTER — Encounter: Payer: Self-pay | Admitting: Ophthalmology

## 2016-01-21 DIAGNOSIS — H2512 Age-related nuclear cataract, left eye: Secondary | ICD-10-CM | POA: Diagnosis not present

## 2016-01-21 HISTORY — PX: CATARACT EXTRACTION W/PHACO: SHX586

## 2016-01-21 LAB — GLUCOSE, CAPILLARY: GLUCOSE-CAPILLARY: 101 mg/dL — AB (ref 65–99)

## 2016-01-21 SURGERY — PHACOEMULSIFICATION, CATARACT, WITH IOL INSERTION
Anesthesia: Topical | Site: Eye | Laterality: Left | Wound class: Clean

## 2016-01-21 MED ORDER — EPINEPHRINE PF 1 MG/ML IJ SOLN
INTRAMUSCULAR | Status: AC
  Filled 2016-01-21: qty 2

## 2016-01-21 MED ORDER — FENTANYL CITRATE (PF) 100 MCG/2ML IJ SOLN
INTRAMUSCULAR | Status: DC | PRN
  Administered 2016-01-21: 50 ug via INTRAVENOUS

## 2016-01-21 MED ORDER — LIDOCAINE HCL (PF) 4 % IJ SOLN
INTRAOCULAR | Status: DC | PRN
  Administered 2016-01-21: 4 mL via OPHTHALMIC

## 2016-01-21 MED ORDER — MOXIFLOXACIN HCL 0.5 % OP SOLN
1.0000 [drp] | OPHTHALMIC | Status: AC
  Administered 2016-01-21 (×3): 1 [drp] via OPHTHALMIC

## 2016-01-21 MED ORDER — EPINEPHRINE PF 1 MG/ML IJ SOLN
INTRAOCULAR | Status: DC | PRN
  Administered 2016-01-21: 1 mL via OPHTHALMIC

## 2016-01-21 MED ORDER — ARMC OPHTHALMIC DILATING DROPS
1.0000 "application " | OPHTHALMIC | Status: AC
  Administered 2016-01-21 (×3): 1 via OPHTHALMIC

## 2016-01-21 MED ORDER — NA CHONDROIT SULF-NA HYALURON 40-17 MG/ML IO SOLN
INTRAOCULAR | Status: DC | PRN
  Administered 2016-01-21: 1 mL via INTRAOCULAR

## 2016-01-21 MED ORDER — SODIUM CHLORIDE 0.9 % IV SOLN
INTRAVENOUS | Status: DC
  Administered 2016-01-21: 09:00:00 via INTRAVENOUS

## 2016-01-21 MED ORDER — MOXIFLOXACIN HCL 0.5 % OP SOLN
OPHTHALMIC | Status: AC
  Administered 2016-01-21: 1 [drp] via OPHTHALMIC
  Filled 2016-01-21: qty 3

## 2016-01-21 MED ORDER — MIDAZOLAM HCL 2 MG/2ML IJ SOLN
INTRAMUSCULAR | Status: DC | PRN
  Administered 2016-01-21 (×2): 1 mg via INTRAVENOUS

## 2016-01-21 MED ORDER — NA CHONDROIT SULF-NA HYALURON 40-17 MG/ML IO SOLN
INTRAOCULAR | Status: AC
  Filled 2016-01-21: qty 1

## 2016-01-21 MED ORDER — TETRACAINE HCL 0.5 % OP SOLN
OPHTHALMIC | Status: AC
  Filled 2016-01-21: qty 2

## 2016-01-21 MED ORDER — POVIDONE-IODINE 5 % OP SOLN
OPHTHALMIC | Status: AC
  Filled 2016-01-21: qty 30

## 2016-01-21 MED ORDER — LIDOCAINE HCL (PF) 4 % IJ SOLN
INTRAMUSCULAR | Status: AC
  Filled 2016-01-21: qty 5

## 2016-01-21 MED ORDER — ARMC OPHTHALMIC DILATING DROPS
OPHTHALMIC | Status: AC
  Administered 2016-01-21: 1 via OPHTHALMIC
  Filled 2016-01-21: qty 0.4

## 2016-01-21 MED ORDER — CARBACHOL 0.01 % IO SOLN
INTRAOCULAR | Status: DC | PRN
  Administered 2016-01-21: 0.5 mL via INTRAOCULAR

## 2016-01-21 MED ORDER — MOXIFLOXACIN HCL 0.5 % OP SOLN
OPHTHALMIC | Status: DC | PRN
  Administered 2016-01-21: 1 [drp] via OPHTHALMIC

## 2016-01-21 MED ORDER — POVIDONE-IODINE 5 % OP SOLN
OPHTHALMIC | Status: DC | PRN
  Administered 2016-01-21: 1 via OPHTHALMIC

## 2016-01-21 SURGICAL SUPPLY — 21 items
CANNULA ANT/CHMB 27GA (MISCELLANEOUS) ×2 IMPLANT
CUP MEDICINE 2OZ PLAST GRAD ST (MISCELLANEOUS) ×2 IMPLANT
GLOVE BIO SURGEON STRL SZ8 (GLOVE) ×2 IMPLANT
GLOVE BIOGEL M 6.5 STRL (GLOVE) ×2 IMPLANT
GLOVE SURG LX 8.0 MICRO (GLOVE) ×1
GLOVE SURG LX STRL 8.0 MICRO (GLOVE) ×1 IMPLANT
GOWN STRL REUS W/ TWL LRG LVL3 (GOWN DISPOSABLE) ×2 IMPLANT
GOWN STRL REUS W/TWL LRG LVL3 (GOWN DISPOSABLE) ×2
LENS IOL TECNIS ITEC 21.0 (Intraocular Lens) ×2 IMPLANT
PACK CATARACT (MISCELLANEOUS) ×2 IMPLANT
PACK CATARACT BRASINGTON LX (MISCELLANEOUS) ×2 IMPLANT
PACK EYE AFTER SURG (MISCELLANEOUS) ×2 IMPLANT
SOL BSS BAG (MISCELLANEOUS) ×2
SOL PREP PVP 2OZ (MISCELLANEOUS) ×2
SOLUTION BSS BAG (MISCELLANEOUS) ×1 IMPLANT
SOLUTION PREP PVP 2OZ (MISCELLANEOUS) ×1 IMPLANT
SYR 3ML LL SCALE MARK (SYRINGE) ×2 IMPLANT
SYR 5ML LL (SYRINGE) ×2 IMPLANT
SYR TB 1ML 27GX1/2 LL (SYRINGE) ×2 IMPLANT
WATER STERILE IRR 250ML POUR (IV SOLUTION) ×2 IMPLANT
WIPE NON LINTING 3.25X3.25 (MISCELLANEOUS) ×2 IMPLANT

## 2016-01-21 NOTE — Discharge Instructions (Signed)
Eye Surgery Discharge Instructions  Expect mild scratchy sensation or mild soreness. DO NOT RUB YOUR EYE!  The day of surgery:  Minimal physical activity, but bed rest is not required  No reading, computer work, or close hand work  No bending, lifting, or straining.  May watch TV  For 24 hours:  No driving, legal decisions, or alcoholic beverages  Safety precautions  Eat anything you prefer: It is better to start with liquids, then soup then solid foods.  _____ Eye patch should be worn until postoperative exam tomorrow.  ____ Solar shield eyeglasses should be worn for comfort in the sunlight/patch while sleeping  Resume all regular medications including aspirin or Coumadin if these were discontinued prior to surgery. You may shower, bathe, shave, or wash your hair. Tylenol may be taken for mild discomfort.  Call your doctor if you experience significant pain, nausea, or vomiting, fever > 101 or other signs of infection. [phone] or [phone] Specific instructions:  Follow-up Information    PORFILIO,WILLIAM [name], MD Follow up on 01/22/2016.   Specialty:  Ophthalmology Why:  1:40 Contact information: [address] [place] [address] [phone]

## 2016-01-21 NOTE — Anesthesia Postprocedure Evaluation (Signed)
Anesthesia Post Note  Patient: Tiffany Alexander  Procedure(s) Performed: Procedure(s) (LRB): CATARACT EXTRACTION PHACO AND INTRAOCULAR LENS PLACEMENT (IOC) (Left)  Patient location during evaluation: Short Stay Level of consciousness: awake and alert and oriented Pain management: pain level controlled Vital Signs Assessment: post-procedure vital signs reviewed and stable Respiratory status: spontaneous breathing Cardiovascular status: stable Postop Assessment: no headache Anesthetic complications: no    Last Vitals:  Vitals:   01/21/16 0947 01/21/16 0949  BP: 112/75   Pulse: 77 76  Resp: 20   Temp:      Last Pain:  Vitals:   01/21/16 0847  TempSrc: Leatrice Jewels                 Lanora Manis

## 2016-01-21 NOTE — H&P (Signed)
All labs reviewed. Abnormal studies sent to patients PCP when indicated.  Previous H&P reviewed, patient examined, there are NO CHANGES.  Tiffany Alexander [name]11/14/20179:45 AM

## 2016-01-21 NOTE — Op Note (Signed)
PREOPERATIVE DIAGNOSIS:  Nuclear sclerotic cataract of the left eye.   POSTOPERATIVE DIAGNOSIS:  Nuclear sclerotic cataract of the left eye.   OPERATIVE PROCEDURE: Procedure(s): CATARACT EXTRACTION PHACO AND INTRAOCULAR LENS PLACEMENT (IOC)   SURGEON:  Birder Robson, MD.   ANESTHESIA:  Anesthesiologist: Molli Barrows, MD CRNA: Jonna Clark, CRNA  1.      Managed anesthesia care. 2.     0.84ml of Shugarcaine was instilled following the paracentesis   COMPLICATIONS:  None.   TECHNIQUE:   Stop and chop   DESCRIPTION OF PROCEDURE:  The patient was examined and consented in the preoperative holding area where the aforementioned topical anesthesia was applied to the left eye and then brought back to the Operating Room where the left eye was prepped and draped in the usual sterile ophthalmic fashion and a lid speculum was placed. A paracentesis was created with the side port blade and the anterior chamber was filled with viscoelastic. A near clear corneal incision was performed with the steel keratome. A continuous curvilinear capsulorrhexis was performed with a cystotome followed by the capsulorrhexis forceps. Hydrodissection and hydrodelineation were carried out with BSS on a blunt cannula. The lens was removed in a stop and chop  technique and the remaining cortical material was removed with the irrigation-aspiration handpiece. The capsular bag was inflated with viscoelastic and the Technis [id number] lens was placed in the capsular bag without complication. The remaining viscoelastic was removed from the eye with the irrigation-aspiration handpiece. The wounds were hydrated. The anterior chamber was flushed with Miostat and the eye was inflated to physiologic pressure. 0.57ml Vigamox was placed in the anterior chamber. The wounds were found to be water tight. The eye was dressed with Vigamox. The patient was given protective glasses to wear throughout the day and a shield with which to sleep  tonight. The patient was also given drops with which to begin a drop regimen today and will follow-up with me in one day.  Implant Name Type Inv. Item Serial No. Manufacturer Lot No. LRB No. Used  LENS IOL DIOP 21.0 - [id number] Intraocular Lens LENS IOL DIOP 21.0 [id number] AMO   Left 1    Procedure(s) with comments: CATARACT EXTRACTION PHACO AND INTRAOCULAR LENS PLACEMENT (IOC) (Left) - [place] 50.4 AP% 19.6 CDE 9.92 FLUID PACK LOT # [id number]  Electronically signed: [place] 01/21/2016 9:46 AM

## 2016-01-21 NOTE — Transfer of Care (Signed)
Immediate Anesthesia Transfer of Care Note  Patient: Tiffany Alexander  Procedure(s) Performed: Procedure(s) with comments: CATARACT EXTRACTION PHACO AND INTRAOCULAR LENS PLACEMENT (IOC) (Left) - [place] 50.4 AP% 19.6 CDE 9.92 FLUID PACK LOT # [id number]  Patient Location: PACU and Short Stay  Anesthesia Type:MAC  Level of Consciousness: awake, alert  and oriented  Airway & Oxygen Therapy: Patient Spontanous Breathing  Post-op Assessment: Report given to RN and Post -op Vital signs reviewed and stable  Post vital signs: Reviewed and stable  Last Vitals:  Vitals:   01/21/16 0847 01/21/16 0947  BP: (!) 144/54 112/75  Pulse: 75 77  Resp: 16 20  Temp: 36.7 C     Last Pain:  Vitals:   01/21/16 0847  TempSrc: Oral      Patients Stated Pain Goal: 0 ([id number])  Complications: No apparent anesthesia complications

## 2016-01-23 NOTE — Anesthesia Preprocedure Evaluation (Signed)
Anesthesia Evaluation  Patient identified by MRN, date of birth, ID band Patient awake    Reviewed: Allergy & Precautions, H&P , NPO status , Patient's Chart, lab work & pertinent test results, reviewed documented beta blocker date and time   Airway Mallampati: II  TM Distance: >3 FB Neck ROM: full    Dental no notable dental hx. (+) Teeth Intact   Pulmonary neg pulmonary ROS,    Pulmonary exam normal breath sounds clear to auscultation       Cardiovascular Exercise Tolerance: Good hypertension, negative cardio ROS   Rhythm:regular Rate:Normal     Neuro/Psych PSYCHIATRIC DISORDERS negative neurological ROS  negative psych ROS   GI/Hepatic negative GI ROS, Neg liver ROS, GERD  ,  Endo/Other  negative endocrine ROSdiabetes  Renal/GU      Musculoskeletal   Abdominal   Peds  Hematology negative hematology ROS (+)   Anesthesia Other Findings   Reproductive/Obstetrics negative OB ROS                             Anesthesia Physical Anesthesia Plan  ASA: III  Anesthesia Plan: MAC   Post-op Pain Management:    Induction:   Airway Management Planned:   Additional Equipment:   Intra-op Plan:   Post-operative Plan:   Informed Consent: I have reviewed the patients History and Physical, chart, labs and discussed the procedure including the risks, benefits and alternatives for the proposed anesthesia with the patient or authorized representative who has indicated his/her understanding and acceptance.     Plan Discussed with: CRNA  Anesthesia Plan Comments:         Anesthesia Quick Evaluation

## 2016-02-05 ENCOUNTER — Inpatient Hospital Stay: Admit: 2016-02-05 | Payer: Medicare HMO | Admitting: Orthopedic Surgery

## 2016-02-05 SURGERY — ARTHROPLASTY, HIP, TOTAL,POSTERIOR APPROACH
Anesthesia: Choice | Laterality: Left

## 2016-03-24 ENCOUNTER — Encounter: Payer: Self-pay | Admitting: *Deleted

## 2016-03-27 NOTE — Discharge Instructions (Signed)
[facility] °[facility] ° °POST OPERATIVE INSTRUCTIONS FOR DR. TROXLER AND DR. FOWLER °[facility] ° ° °1. Take your medication as prescribed.  Pain medication should be taken only as needed. ° °2. Keep the dressing clean, dry and intact. ° °3. Keep your foot elevated above the heart level for the first 48 hours. ° °4. Walking to the bathroom and brief periods of walking are acceptable, unless we have instructed you to be non-weight bearing. ° °5. Always wear your post-op shoe when walking.  Always use your crutches if you are to be non-weight bearing. ° °6. Do not take a shower. Baths are permissible as long as the foot is kept out of the water.  ° °7. Every hour you are awake:  °- Bend your knee 15 times. °- Flex foot 15 times °- Massage calf 15 times ° °8. Call [facility] ([phone]) if any of the following problems occur: °- You develop a temperature or fever. °- The bandage becomes saturated with blood. °- Medication does not stop your pain. °- Injury of the foot occurs. °- Any symptoms of infection including redness, odor, or red streaks running from wound. ° ° °General Anesthesia, Adult, Care After °These instructions provide you with information about caring for yourself after your procedure. Your health care provider may also give you more specific instructions. Your treatment has been planned according to current medical practices, but problems sometimes occur. Call your health care provider if you have any problems or questions after your procedure. °What can I expect after the procedure? °After the procedure, it is common to have: °· Vomiting. °· A sore throat. °· Mental slowness. °It is common to feel: °· Nauseous. °· Cold or shivery. °· Sleepy. °· Tired. °· Sore or achy, even in parts of your body where you did not have surgery. °Follow these instructions at home: °For at least 24 hours after the procedure: °· Do not: °¨ Participate in  activities where you could fall or become injured. °¨ Drive. °¨ Use heavy machinery. °¨ Drink alcohol. °¨ Take sleeping pills or medicines that cause drowsiness. °¨ Make important decisions or sign legal documents. °¨ Take care of children on your own. °· Rest. °Eating and drinking °· If you vomit, drink water, juice, or soup when you can drink without vomiting. °· Drink enough fluid to keep your urine clear or pale yellow. °· Make sure you have little or no nausea before eating solid foods. °· Follow the diet recommended by your health care provider. °General instructions °· Have a responsible adult stay with you until you are awake and alert. °· Return to your normal activities as told by your health care provider. Ask your health care provider what activities are safe for you. °· Take over-the-counter and prescription medicines only as told by your health care provider. °· If you smoke, do not smoke without supervision. °· Keep all follow-up visits as told by your health care provider. This is important. °Contact a health care provider if: °· You continue to have nausea or vomiting at home, and medicines are not helpful. °· You cannot drink fluids or start eating again. °· You cannot urinate after 8-12 hours. °· You develop a skin rash. °· You have fever. °· You have increasing redness at the site of your procedure. °Get help right away if: °· You have difficulty breathing. °· You have chest pain. °· You have unexpected bleeding. °· You feel that you are having   a life-threatening or urgent problem. °This information is not intended to replace advice given to you by your health care provider. Make sure you discuss any questions you have with your health care provider. °Document Released: 06/01/2000 Document Revised: 07/29/2015 Document Reviewed: 02/07/2015 °Elsevier Interactive Patient Education © 2017 [employer] ° °

## 2016-04-01 ENCOUNTER — Encounter
Admission: RE | Disposition: A | Discharge status: Home / Self Care | Payer: Self-pay | Source: Ambulatory Visit | Attending: Podiatry

## 2016-04-01 ENCOUNTER — Ambulatory Visit: Payer: Medicare HMO | Admitting: Anesthesiology

## 2016-04-01 ENCOUNTER — Ambulatory Visit
Admission: RE | Admit: 2016-04-01 | Discharge: 2016-04-01 | Disposition: A | Discharge status: Home / Self Care | Payer: Medicare HMO | Source: Ambulatory Visit | Attending: Podiatry | Admitting: Podiatry

## 2016-04-01 DIAGNOSIS — Z880 Allergy status to penicillin: Secondary | ICD-10-CM | POA: Diagnosis not present

## 2016-04-01 DIAGNOSIS — Z885 Allergy status to narcotic agent status: Secondary | ICD-10-CM | POA: Diagnosis not present

## 2016-04-01 DIAGNOSIS — Z881 Allergy status to other antibiotic agents status: Secondary | ICD-10-CM | POA: Insufficient documentation

## 2016-04-01 DIAGNOSIS — M2042 Other hammer toe(s) (acquired), left foot: Secondary | ICD-10-CM | POA: Insufficient documentation

## 2016-04-01 DIAGNOSIS — I10 Essential (primary) hypertension: Secondary | ICD-10-CM | POA: Diagnosis not present

## 2016-04-01 DIAGNOSIS — E119 Type 2 diabetes mellitus without complications: Secondary | ICD-10-CM | POA: Diagnosis not present

## 2016-04-01 HISTORY — DX: Presence of dental prosthetic device (complete) (partial): Z97.2

## 2016-04-01 HISTORY — PX: HAMMER TOE SURGERY: SHX385

## 2016-04-01 LAB — GLUCOSE, CAPILLARY
Glucose-Capillary: 112 mg/dL — ABNORMAL HIGH (ref 65–99)
Glucose-Capillary: 91 mg/dL (ref 65–99)

## 2016-04-01 SURGERY — CORRECTION, HAMMER TOE
Anesthesia: Monitor Anesthesia Care | Site: Toe | Laterality: Left | Wound class: Clean

## 2016-04-01 MED ORDER — EPHEDRINE SULFATE 50 MG/ML IJ SOLN
INTRAMUSCULAR | Status: DC | PRN
  Administered 2016-04-01 (×2): 5 mg via INTRAVENOUS

## 2016-04-01 MED ORDER — LIDOCAINE HCL (CARDIAC) 20 MG/ML IV SOLN
INTRAVENOUS | Status: DC | PRN
  Administered 2016-04-01: 50 mg via INTRAVENOUS

## 2016-04-01 MED ORDER — OXYCODONE HCL 5 MG/5ML PO SOLN: 5.0000 mg | Freq: Once | ORAL | Status: DC | PRN

## 2016-04-01 MED ORDER — MIDAZOLAM HCL 2 MG/2ML IJ SOLN
INTRAMUSCULAR | Status: DC | PRN
  Administered 2016-04-01: 2 mg via INTRAVENOUS

## 2016-04-01 MED ORDER — SODIUM CHLORIDE 0.9 % IV SOLN
600.0000 mg | Freq: Once | INTRAVENOUS | Status: AC
  Administered 2016-04-01: 600 mg via INTRAVENOUS

## 2016-04-01 MED ORDER — OXYCODONE HCL 5 MG PO TABS: 5.0000 mg | ORAL_TABLET | Freq: Once | ORAL | Status: DC | PRN

## 2016-04-01 MED ORDER — LACTATED RINGERS IV SOLN
INTRAVENOUS | Status: DC
  Administered 2016-04-01: 12:00:00 via INTRAVENOUS

## 2016-04-01 MED ORDER — FENTANYL CITRATE (PF) 100 MCG/2ML IJ SOLN
INTRAMUSCULAR | Status: DC | PRN
Start: 2016-04-01 — End: 2016-04-01
  Administered 2016-04-01: 50 ug via INTRAVENOUS

## 2016-04-01 MED ORDER — FENTANYL CITRATE (PF) 100 MCG/2ML IJ SOLN: 25.0000 ug | INTRAMUSCULAR | Status: DC | PRN

## 2016-04-01 MED ORDER — BUPIVACAINE HCL (PF) 0.25 % IJ SOLN
INTRAMUSCULAR | Status: DC | PRN
  Administered 2016-04-01: 4.5 mL

## 2016-04-01 MED ORDER — LIDOCAINE HCL (PF) 1 % IJ SOLN
INTRAMUSCULAR | Status: DC | PRN
  Administered 2016-04-01: 4.5 mL

## 2016-04-01 MED ORDER — HYDROCODONE-ACETAMINOPHEN 5-325 MG PO TABS: 1.0000 | ORAL_TABLET | Freq: Four times a day (QID) | ORAL | 0 refills | Status: DC | PRN

## 2016-04-01 MED ORDER — PROPOFOL 500 MG/50ML IV EMUL
INTRAVENOUS | Status: DC | PRN
  Administered 2016-04-01: 75 ug/kg/min via INTRAVENOUS

## 2016-04-01 SURGICAL SUPPLY — 48 items
BANDAGE ELASTIC 4 VELCRO NS (GAUZE/BANDAGES/DRESSINGS) ×2 IMPLANT
BENZOIN TINCTURE PRP APPL 2/3 (GAUZE/BANDAGES/DRESSINGS) IMPLANT
BLADE MED AGGRESSIVE (BLADE) IMPLANT
BLADE OSC/SAGITTAL MD 5.5X18 (BLADE) ×2 IMPLANT
BLADE SURG 15 STRL LF DISP TIS (BLADE) ×2 IMPLANT
BLADE SURG 15 STRL SS (BLADE) ×2
BNDG COHESIVE 4X5 TAN STRL (GAUZE/BANDAGES/DRESSINGS) ×2 IMPLANT
BNDG ESMARK 4X12 TAN STRL LF (GAUZE/BANDAGES/DRESSINGS) ×2 IMPLANT
BNDG GAUZE 4.5X4.1 6PLY STRL (MISCELLANEOUS) ×2 IMPLANT
BNDG STRETCH 4X75 STRL LF (GAUZE/BANDAGES/DRESSINGS) ×2 IMPLANT
CANISTER SUCT 1200ML W/VALVE (MISCELLANEOUS) ×2 IMPLANT
COVER LIGHT HANDLE UNIVERSAL (MISCELLANEOUS) ×4 IMPLANT
CUFF TOURN SGL QUICK 18 (TOURNIQUET CUFF) ×2 IMPLANT
DRAPE FLUOR MINI C-ARM 54X84 (DRAPES) ×2 IMPLANT
DURAPREP 26ML APPLICATOR (WOUND CARE) ×2 IMPLANT
FIXATION HAMMERTOE ANGLD 15MM (Toe) ×2 IMPLANT
GAUZE PETRO XEROFOAM 1X8 (MISCELLANEOUS) ×2 IMPLANT
GAUZE SPONGE 4X4 12PLY STRL (GAUZE/BANDAGES/DRESSINGS) ×2 IMPLANT
GLOVE BIO SURGEON STRL SZ7.5 (GLOVE) ×2 IMPLANT
GLOVE INDICATOR 8.0 STRL GRN (GLOVE) ×2 IMPLANT
GOWN STRL REUS W/ TWL LRG LVL3 (GOWN DISPOSABLE) ×2 IMPLANT
GOWN STRL REUS W/TWL LRG LVL3 (GOWN DISPOSABLE) ×2
HAMMERTOE ANGLED 15MM 5MM (Toe) ×4 IMPLANT
K-WIRE DBL END TROCAR 6X.045 (WIRE)
K-WIRE DBL END TROCAR 6X.062 (WIRE)
KIT DRILL HAMMERLOCK2 IMPLANT (BIT) ×2 IMPLANT
KIT ROOM TURNOVER OR (KITS) ×2 IMPLANT
KWIRE DBL END TROCAR 6X.045 (WIRE) IMPLANT
KWIRE DBL END TROCAR 6X.062 (WIRE) IMPLANT
NS IRRIG 500ML POUR BTL (IV SOLUTION) ×2 IMPLANT
PACK EXTREMITY ARMC (MISCELLANEOUS) ×2 IMPLANT
PAD GROUND ADULT SPLIT (MISCELLANEOUS) ×2 IMPLANT
PIN BALLS 3/8 F/.045 WIRE (MISCELLANEOUS) IMPLANT
RASP SM TEAR CROSS CUT (RASP) IMPLANT
STOCKINETTE IMPERVIOUS LG (DRAPES) ×2 IMPLANT
STRAP BODY AND KNEE 60X3 (MISCELLANEOUS) ×2 IMPLANT
STRIP CLOSURE SKIN 1/4X4 (GAUZE/BANDAGES/DRESSINGS) IMPLANT
SUT ETHILON 4-0 (SUTURE) ×2
SUT ETHILON 4-0 FS2 18XMFL BLK (SUTURE) ×2
SUT ETHILON 5-0 FS-2 18 BLK (SUTURE) IMPLANT
SUT MNCRL 5-0+ PC-1 (SUTURE) IMPLANT
SUT MONOCRYL 5-0 (SUTURE)
SUT VIC AB 2-0 SH 27 (SUTURE)
SUT VIC AB 2-0 SH 27XBRD (SUTURE) IMPLANT
SUT VIC AB 3-0 SH 27 (SUTURE)
SUT VIC AB 3-0 SH 27X BRD (SUTURE) IMPLANT
SUT VIC AB 4-0 FS2 27 (SUTURE) ×2 IMPLANT
SUTURE ETHLN 4-0 FS2 18XMF BLK (SUTURE) ×2 IMPLANT

## 2016-04-01 NOTE — Anesthesia Procedure Notes (Signed)
Procedure Name: MAC Performed by: Alleyne Lac Pre-anesthesia Checklist: Patient identified, Emergency Drugs available, Suction available, Timeout performed and Patient being monitored Patient Re-evaluated:Patient Re-evaluated prior to inductionOxygen Delivery Method: Simple face mask Placement Confirmation: positive ETCO2       

## 2016-04-01 NOTE — H&P (Signed)
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  The various methods of treatment have been discussed with the patient.  No guarantees were given.  After consideration of risks, benefits and other options for treatment, the patient has consented to surgery.  I have reviewed the patients' chart and labs.    Patient Vitals for the past 24 hrs:  BP Temp Pulse Resp SpO2 Height Weight  04/01/16 1115 134/61 98.6 F (37 C) 73 16 96 % 5\' 3"  (1.6 m) 87.1 kg (192 lb)    Alexander history and physical examination was performed in my office.  The patient was reexamined.  There have been no changes to this history and physical examination.  Tiffany Alexander

## 2016-04-01 NOTE — Anesthesia Postprocedure Evaluation (Signed)
Anesthesia Post Note  Patient: Tiffany Alexander  Procedure(s) Performed: Procedure(s) (LRB): HAMMER TOE CORRECTION  Left 2nd  & 3rd toe (Left)  Patient location during evaluation: PACU Anesthesia Type: MAC Level of consciousness: awake and alert Pain management: pain level controlled Vital Signs Assessment: post-procedure vital signs reviewed and stable Respiratory status: spontaneous breathing Cardiovascular status: blood pressure returned to baseline Postop Assessment: no headache Anesthetic complications: no    Jaci Standard, III,  Venicia Vandall D

## 2016-04-01 NOTE — Transfer of Care (Signed)
Immediate Anesthesia Transfer of Care Note  Patient: Tiffany Alexander  Procedure(s) Performed: Procedure(s) with comments: HAMMER TOE CORRECTION  Left 2nd  & 3rd toe (Left) - Special:  Hammer lock implants Left 2nd & 3rd IVA LOcal Diabetic - oral meds  Patient Location: PACU  Anesthesia Type: MAC  Level of Consciousness: awake, alert  and patient cooperative  Airway and Oxygen Therapy: Patient Spontanous Breathing and Patient connected to supplemental oxygen  Post-op Assessment: Post-op Vital signs reviewed, Patient's Cardiovascular Status Stable, Respiratory Function Stable, Patent Airway and No signs of Nausea or vomiting  Post-op Vital Signs: Reviewed and stable  Complications: No apparent anesthesia complications

## 2016-04-01 NOTE — Op Note (Signed)
Operative note   Surgeon:Alida Greiner Lawyer: None    Preop diagnosis: Hammertoe left second and third toes    Postop diagnosis: Same    Procedure: 1. PIPJ hammertoe repair with arthrodesis with hammerlock implant left second toe 2. PIPJ hammertoe repair with arthrodesis with hammerlock implant left third toe 3. Capsulotomy left second MTPJ 4. Capsulotomy left third MTPJ    EBL: Minimal    Anesthesia:local and IV sedation    Hemostasis: Midcalf tourniquet inflated to 200 mmHg for approximate 45 minutes    Specimen: None    Complications: None    Operative indications:Tiffany Alexander is an 71 y.o. that presents today for surgical intervention.  The risks/benefits/alternatives/complications have been discussed and consent has been given.    Procedure:  Patient was brought into the OR and placed on the operating table in thesupine position. After anesthesia was obtained theleft lower extremity was prepped and draped in usual sterile fashion.  Attention was directed to the left second and third toes where a longitudinal incision was performed. The incision was begun at the PIPJ and curved around the MTPJ to just proximal to the metatarsophalangeal joint. Sharp and blunt dissection carried down to the long extensor tendon. Extensor tenotomy was then performed. The head of the proximal phalanx and base of the middle phalanx were exposed. These were then resected with a power saw. The toe was still in a slight dorsal position after decompression at the PIPJ. Attention was then directed to the metatarsal phalangeal joint where a dorsal medial and lateral capsulotomy was performed. The plantar structures were released with a McGlamry elevator. Was noted at this time. Next a medium curved hammerlock implant was placed across the proximal interphalangeal joint and stabilized. The wound was flushed with copious amounts or irrigation. A extensor tendon lengthening had been performed and  flapped. This was closed with 4-0 Vicryl. The subcutaneous closed with 4-0 Vicryl. The skin was closed with a 4-0 nylon. Attention was then directed to the third toe where a longitudinal incision was made from the MTPJ to the PIPJ. Sharp and blunt dissection carried down to the long extensor tendon. This was resected at the PIPJ and the head of the proximal phalanx and base of the middle phalanx were exposed. These were then transected with a power saw. Good decompression was noted with slight dorsal contracture of the toe at the MTPJ. A dorsal medial and lateral capsulotomy was then performed and the toe was in a much better position. At this time the wound was flushed with copious amounts or irrigation. A medium curved hammerlock implant was then placed across the PIPJ. Good alignment was noted. The extensor tendon was reapproximated at the PIPJ with a 4-0 Vicryl. The subcutaneous tissue reapproximated with a 4-0 Vicryl and the skin with a 4-0 nylon. A well compressive sterile dressing was then applied with the toes in a good anatomic position. Good perfusion of the toes were noticed after releasing the tourniquet. A well compressive sterile dressing was applied. I will see her back in the outpatient clinic in 5-7 days. A prescription for hydrocodone was provided and the patient states she has had this in the past without issue.    Patient tolerated the procedure and anesthesia well.  Was transported from the OR to the PACU with all vital signs stable and vascular status intact. To be discharged per routine protocol.  Will follow up in approximately 1 week in the outpatient clinic.

## 2016-04-01 NOTE — Anesthesia Preprocedure Evaluation (Signed)
Anesthesia Evaluation  Patient identified by MRN, date of birth, ID band Patient awake    Reviewed: Allergy & Precautions, H&P , NPO status , Patient's Chart, lab work & pertinent test results  History of Anesthesia Complications (+) PONV and history of anesthetic complications  Airway Mallampati: II  TM Distance: >3 FB Neck ROM: full    Dental no notable dental hx.    Pulmonary neg pulmonary ROS,    Pulmonary exam normal        Cardiovascular hypertension, On Medications Normal cardiovascular exam     Neuro/Psych    GI/Hepatic negative GI ROS, Neg liver ROS, Medicated,  Endo/Other  diabetes, Well Controlled, Type 2  Renal/GU      Musculoskeletal   Abdominal   Peds  Hematology negative hematology ROS (+)   Anesthesia Other Findings   Reproductive/Obstetrics                            Anesthesia Physical Anesthesia Plan  ASA: II  Anesthesia Plan: MAC   Post-op Pain Management:    Induction:   Airway Management Planned:   Additional Equipment:   Intra-op Plan:   Post-operative Plan:   Informed Consent: I have reviewed the patients History and Physical, chart, labs and discussed the procedure including the risks, benefits and alternatives for the proposed anesthesia with the patient or authorized representative who has indicated his/her understanding and acceptance.     Plan Discussed with:   Anesthesia Plan Comments:         Anesthesia Quick Evaluation

## 2016-04-02 ENCOUNTER — Encounter: Payer: Self-pay | Admitting: Podiatry

## 2016-05-15 IMAGING — DX DG HAND 2V*L*
2 series · 2 of 2 positions shown · non-contrast
Comparison: None.

CLINICAL DATA: Postop left hand radiographs.

EXAM:
LEFT HAND - 2 VIEW

[hand ap]
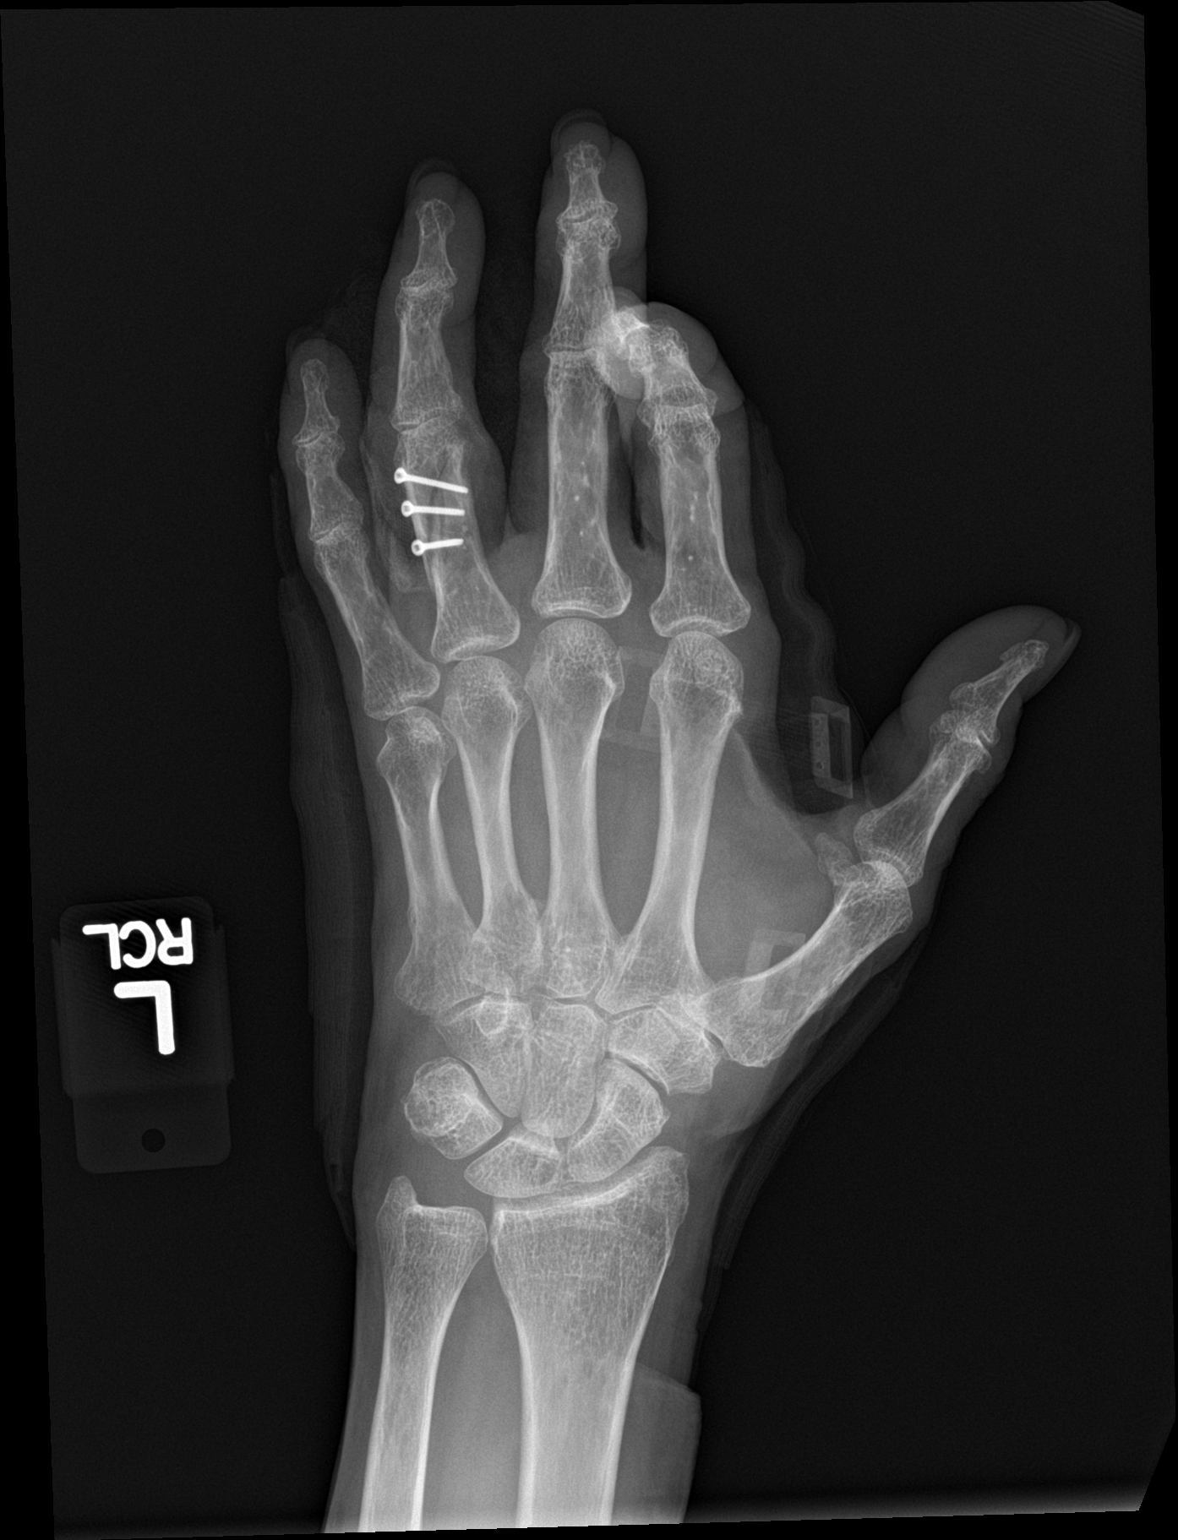

[hand lat]
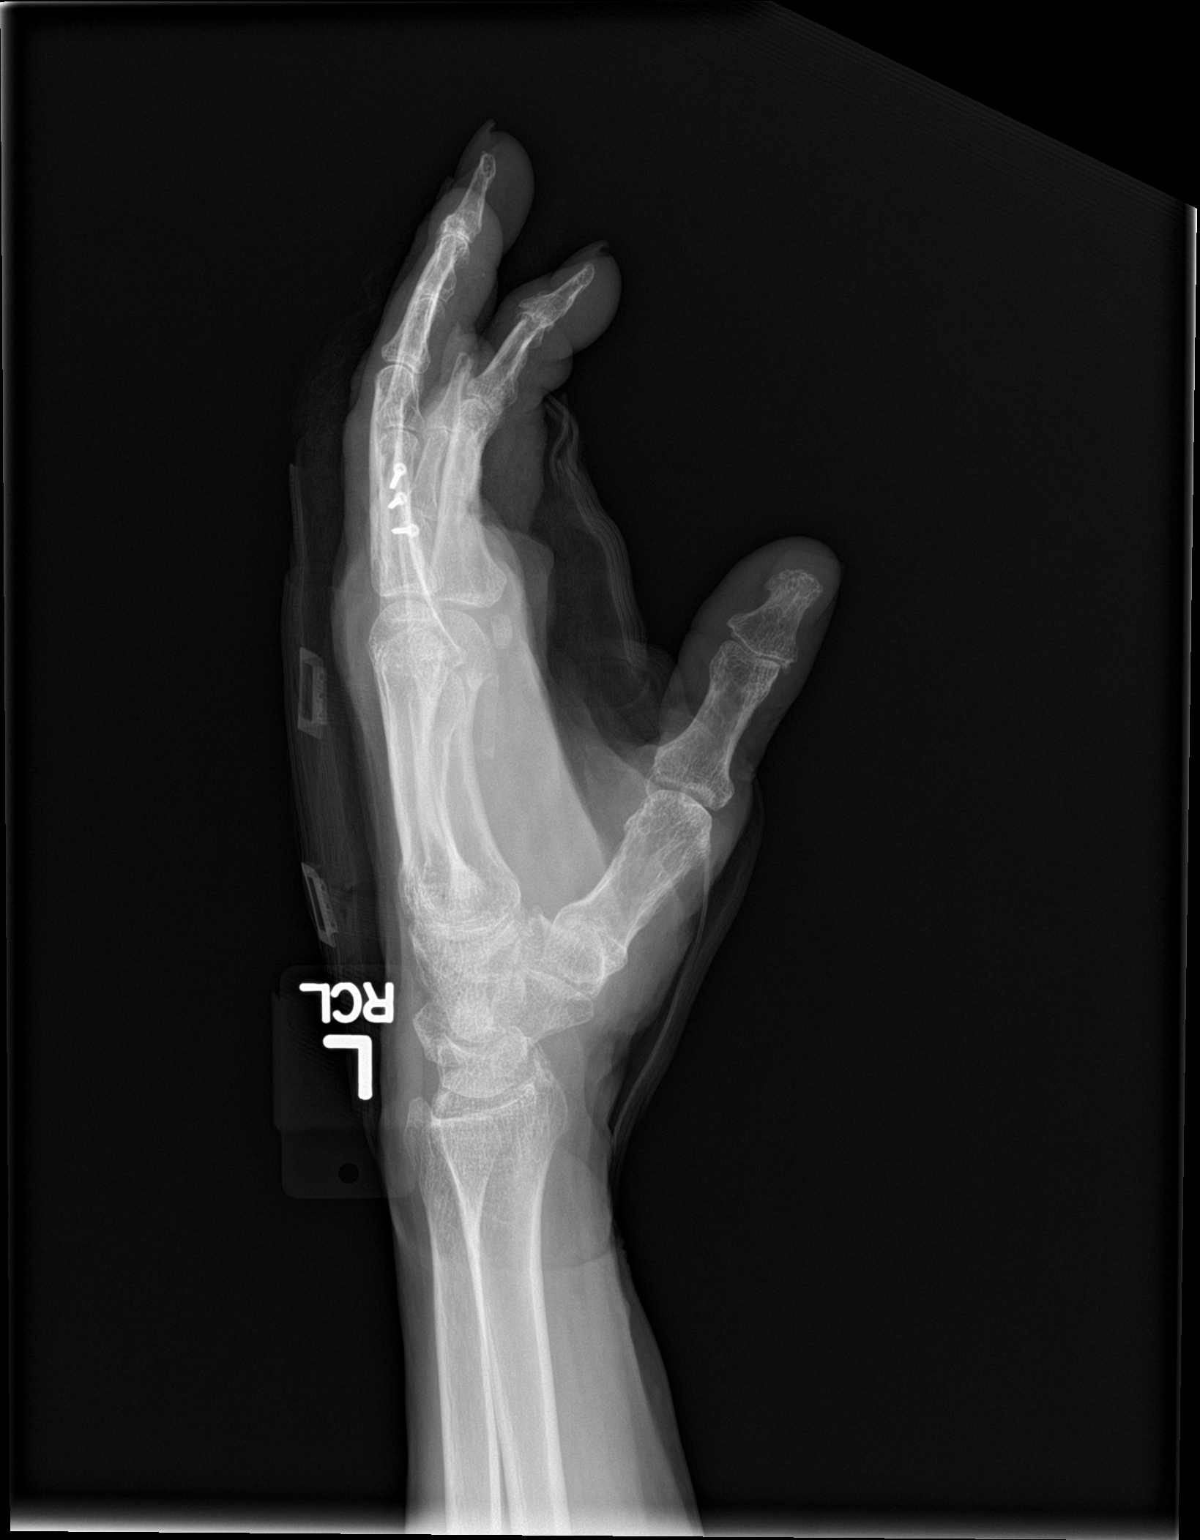

[2 of 2 positions shown; findings below may reference images not displayed]

FINDINGS: Three screws have been inserted across the oblique fracture of the
proximal phalanx of the ring finger. The fracture components are
well aligned. Orthopedic hardware is well-seated.

There is no evidence of an operative complication.
IMPRESSION: 1. Well-aligned fracture components following ORIF of a fracture of
the proximal phalanx of the left ring finger.

## 2016-07-24 ENCOUNTER — Other Ambulatory Visit: Payer: Self-pay | Admitting: Family Medicine

## 2016-07-24 DIAGNOSIS — Z1231 Encounter for screening mammogram for malignant neoplasm of breast: Secondary | ICD-10-CM

## 2016-07-24 DIAGNOSIS — M7989 Other specified soft tissue disorders: Secondary | ICD-10-CM

## 2016-07-30 ENCOUNTER — Ambulatory Visit
Admission: RE | Admit: 2016-07-30 | Discharge: 2016-07-30 | Disposition: A | Discharge status: Home / Self Care | Payer: Medicare HMO | Source: Ambulatory Visit | Attending: Family Medicine | Admitting: Family Medicine

## 2016-07-30 DIAGNOSIS — M7989 Other specified soft tissue disorders: Secondary | ICD-10-CM

## 2016-07-30 DIAGNOSIS — M799 Soft tissue disorder, unspecified: Secondary | ICD-10-CM | POA: Diagnosis present

## 2016-07-30 DIAGNOSIS — R221 Localized swelling, mass and lump, neck: Secondary | ICD-10-CM | POA: Insufficient documentation

## 2016-08-05 ENCOUNTER — Other Ambulatory Visit: Payer: Self-pay | Admitting: Family Medicine

## 2016-08-05 DIAGNOSIS — R221 Localized swelling, mass and lump, neck: Secondary | ICD-10-CM

## 2016-08-12 ENCOUNTER — Ambulatory Visit: Payer: Medicare HMO

## 2016-08-19 ENCOUNTER — Ambulatory Visit
Admission: RE | Admit: 2016-08-19 | Discharge: 2016-08-19 | Disposition: A | Discharge status: Home / Self Care | Payer: Medicare HMO | Source: Ambulatory Visit | Attending: Family Medicine | Admitting: Family Medicine

## 2016-08-19 DIAGNOSIS — R221 Localized swelling, mass and lump, neck: Secondary | ICD-10-CM

## 2016-08-19 DIAGNOSIS — I10 Essential (primary) hypertension: Secondary | ICD-10-CM | POA: Diagnosis not present

## 2016-08-19 LAB — POCT I-STAT CREATININE: Creatinine, Ser: 0.7 mg/dL (ref 0.44–1.00)

## 2016-08-19 MED ORDER — IOPAMIDOL (ISOVUE-300) INJECTION 61%
75.0000 mL | Freq: Once | INTRAVENOUS | Status: AC | PRN
  Administered 2016-08-19: 75 mL via INTRAVENOUS

## 2016-08-26 ENCOUNTER — Ambulatory Visit
Admission: RE | Admit: 2016-08-26 | Discharge: 2016-08-26 | Disposition: A | Discharge status: Home / Self Care | Payer: Medicare HMO | Source: Ambulatory Visit | Attending: Family Medicine | Admitting: Family Medicine

## 2016-08-26 DIAGNOSIS — Z1231 Encounter for screening mammogram for malignant neoplasm of breast: Secondary | ICD-10-CM | POA: Diagnosis not present

## 2017-07-08 ENCOUNTER — Other Ambulatory Visit: Payer: Self-pay | Admitting: Specialist

## 2017-07-08 DIAGNOSIS — R0600 Dyspnea, unspecified: Secondary | ICD-10-CM

## 2017-07-08 DIAGNOSIS — J8410 Pulmonary fibrosis, unspecified: Secondary | ICD-10-CM

## 2017-07-08 DIAGNOSIS — R0609 Other forms of dyspnea: Principal | ICD-10-CM

## 2017-07-16 ENCOUNTER — Other Ambulatory Visit: Payer: Self-pay | Admitting: Family Medicine

## 2017-07-16 DIAGNOSIS — Z1231 Encounter for screening mammogram for malignant neoplasm of breast: Secondary | ICD-10-CM

## 2017-08-03 ENCOUNTER — Other Ambulatory Visit: Payer: Self-pay | Admitting: Specialist

## 2017-08-03 ENCOUNTER — Ambulatory Visit
Admission: RE | Admit: 2017-08-03 | Discharge: 2017-08-03 | Disposition: A | Discharge status: Home / Self Care | Payer: Medicare HMO | Source: Ambulatory Visit | Attending: Specialist | Admitting: Specialist

## 2017-08-03 DIAGNOSIS — R918 Other nonspecific abnormal finding of lung field: Secondary | ICD-10-CM | POA: Insufficient documentation

## 2017-08-03 DIAGNOSIS — R0609 Other forms of dyspnea: Principal | ICD-10-CM

## 2017-08-03 DIAGNOSIS — R0600 Dyspnea, unspecified: Secondary | ICD-10-CM

## 2017-08-03 DIAGNOSIS — I700 Atherosclerosis of aorta: Secondary | ICD-10-CM | POA: Insufficient documentation

## 2017-08-03 DIAGNOSIS — D3502 Benign neoplasm of left adrenal gland: Secondary | ICD-10-CM | POA: Insufficient documentation

## 2017-08-03 DIAGNOSIS — I2510 Atherosclerotic heart disease of native coronary artery without angina pectoris: Secondary | ICD-10-CM | POA: Insufficient documentation

## 2017-08-03 DIAGNOSIS — J8410 Pulmonary fibrosis, unspecified: Secondary | ICD-10-CM

## 2017-08-20 DIAGNOSIS — R768 Other specified abnormal immunological findings in serum: Secondary | ICD-10-CM | POA: Insufficient documentation

## 2017-08-20 DIAGNOSIS — J8410 Pulmonary fibrosis, unspecified: Secondary | ICD-10-CM | POA: Insufficient documentation

## 2017-08-24 DIAGNOSIS — R0602 Shortness of breath: Secondary | ICD-10-CM | POA: Insufficient documentation

## 2017-08-24 DIAGNOSIS — I2510 Atherosclerotic heart disease of native coronary artery without angina pectoris: Secondary | ICD-10-CM | POA: Insufficient documentation

## 2017-08-24 DIAGNOSIS — E782 Mixed hyperlipidemia: Secondary | ICD-10-CM | POA: Insufficient documentation

## 2017-08-30 ENCOUNTER — Ambulatory Visit
Admission: RE | Admit: 2017-08-30 | Discharge: 2017-08-30 | Disposition: A | Discharge status: Home / Self Care | Payer: Medicare HMO | Source: Ambulatory Visit | Attending: Family Medicine | Admitting: Family Medicine

## 2017-08-30 DIAGNOSIS — Z1231 Encounter for screening mammogram for malignant neoplasm of breast: Secondary | ICD-10-CM | POA: Insufficient documentation

## 2017-09-02 ENCOUNTER — Ambulatory Visit: Payer: Medicare HMO | Admitting: Pulmonary Disease

## 2017-09-02 ENCOUNTER — Encounter: Payer: Self-pay | Admitting: Pulmonary Disease

## 2017-09-02 VITALS — BP 150/70 | HR 87 | Ht 63.0 in | Wt 210.0 lb

## 2017-09-02 DIAGNOSIS — J8410 Pulmonary fibrosis, unspecified: Secondary | ICD-10-CM

## 2017-09-02 DIAGNOSIS — Z889 Allergy status to unspecified drugs, medicaments and biological substances status: Secondary | ICD-10-CM

## 2017-09-02 DIAGNOSIS — J449 Chronic obstructive pulmonary disease, unspecified: Secondary | ICD-10-CM

## 2017-09-02 MED ORDER — FLUTICASONE-SALMETEROL 115-21 MCG/ACT IN AERO: 2.0000 | INHALATION_SPRAY | Freq: Two times a day (BID) | RESPIRATORY_TRACT | 12 refills | Status: DC

## 2017-09-02 NOTE — Progress Notes (Signed)
PULMONARY CONSULT NOTE  Requesting MD/Service: Self. Primary MD:  Date of initial consultation: 09/02/17 Reason for consultation: Pulmonary fibrosis, DOE  PT PROFILE: 72 y.o. female never smoker previously seen by Dr Raul Del with a diagnosis of pulmonary fibrosis seeks a second opinion  DATA: 07/08/17 PFTs: FVC 2.61 L, 98% predicted.  FEV1 2.17 L, 106% predicted.  FEV1 ratio 83%.  TLC 77% predicted.  DLCO 71% predicted.  DLCO/VA 125% predicted 08/03/17 HRCT chest: Very mild nonspecific interstitial changes with basilar predominance.  Multiple tiny pulmonary nodules scattered throughout the lungs bilaterally measuring 5 mm or less in size, nonspecific but statistically likely benign.   INTERVAL:  HPI:  As above.  She states that she has been "sick" all year with "asthmatic bronchitis".  She has been treated on multiple occasions with antibiotics, systemic steroids and inhaled bronchodilators.  She has never returned all the way back to her baseline.  Eventually, she underwent a chest x-ray and was referred to Dr. Raul Del for pulmonary consultation.  This led to a CT scan of the chest with the findings as noted above.  At this point she believes she is approximately 75% back to her baseline.  She has "good days and bad days".  Her symptoms are made worse by hot/humid weather.  In the course of her evaluation by Dr. Raul Del she was noted to have an elevated ANA.  It is also notable that she was previously on nitrofurantoin chronically.  This medication was stopped 3 or 4 weeks ago.  Presently, she denies chest pain, fever, hemoptysis, lower extremity edema, calf tenderness.  She also has no problems with rashes, arthralgias, myalgias.  She denies sinus symptoms. She does report persistent, mostly nonproductive cough and wheezing.  She is presently on prednisone at 10 mg/day    Past Medical History:  Diagnosis Date  . Arthritis   . Depression   . Diabetes mellitus without complication ([place])    . Diverticulosis   . GERD (gastroesophageal reflux disease)   . Hypertension   . PONV (postoperative nausea and vomiting)   . PONV (postoperative nausea and vomiting)   . SUI (stress urinary incontinence, female)   . Wears dentures    full upper and lower    Past Surgical History:  Procedure Laterality Date  .  endocervical curettage    . ABDOMINAL HYSTERECTOMY    . BLADDER SUSPENSION    . CARPAL TUNNEL RELEASE Left   . CARPAL TUNNEL RELEASE Right 05/14/2015   Procedure: CARPAL TUNNEL RELEASE;  Surgeon: Hessie Knows, MD;  Location: [facility] ORS;  Service: Orthopedics;  Laterality: Right;  . CATARACT EXTRACTION W/PHACO Right 12/17/2015   Procedure: CATARACT EXTRACTION PHACO AND INTRAOCULAR LENS PLACEMENT ([place]);  Surgeon: Birder Robson, MD;  Location: [facility];  Service: Ophthalmology;  Laterality: Right;  [place] 52.0 AP% 15.4 CDE 7.99 Fluid pack lot # [id number]  . CATARACT EXTRACTION W/PHACO Left 01/21/2016   Procedure: CATARACT EXTRACTION PHACO AND INTRAOCULAR LENS PLACEMENT ([facility]);  Surgeon: Birder Robson, MD;  Location: [facility];  Service: Ophthalmology;  Laterality: Left;  [place] 50.4 AP% 19.6 CDE 9.92 FLUID PACK LOT # [id number]  . CESAREAN SECTION N/A   . COLONOSCOPY N/A 08/24/2014   Procedure: COLONOSCOPY;  Surgeon: Lollie Sails, MD;  Location: [facility] ENDOSCOPY;  Service: Endoscopy;  Laterality: N/A;  . COLONOSCOPY WITH PROPOFOL N/A 04/05/2015   Procedure: COLONOSCOPY WITH PROPOFOL;  Surgeon: Lollie Sails, MD;  Location: [facility] ENDOSCOPY;  Service: Endoscopy;  Laterality: N/A;  . COLPORRHAPHY    .  EYE SURGERY    . FINGER ARTHRODESIS Right 07/12/2014   Procedure: Right middle finger DIP fussion;  Surgeon: Hessie Knows, MD;  Location: [facility] ORS;  Service: Orthopedics;  Laterality: Right;  . FRACTURE SURGERY    . fractured wrist Right   . HAMMER TOE SURGERY Left 04/01/2016   Procedure: HAMMER TOE CORRECTION  Left 2nd  & 3rd toe;  Surgeon: [name], DPM;  Location: [facility];  Service: Podiatry;  Laterality: Left;  Special:  Hammer lock implants Left 2nd & 3rd IVA LOcal Diabetic - oral meds  . OPEN REDUCTION INTERNAL FIXATION (ORIF) METACARPAL Left 05/14/2015   Procedure: OPEN REDUCTION INTERNAL FIXATION (ORIF) METACARPAL;  Surgeon: Hessie Knows, MD;  Location: [facility];  Service: Orthopedics;  Laterality: Left;  . SEPTOPLASTY    . TUBAL LIGATION      MEDICATIONS: I have reviewed all medications and confirmed regimen as documented  Social History   Socioeconomic History  . Marital status: Married    Spouse name: Not on file  . Number of children: Not on file  . Years of education: Not on file  . Highest education level: Not on file  Occupational History  . Not on file  Social Needs  . Financial resource strain: Not on file  . Food insecurity:    Worry: Not on file    Inability: Not on file  . Transportation needs:    Medical: Not on file    Non-medical: Not on file  Tobacco Use  . Smoking status: Never Smoker  . Smokeless tobacco: Never Used  Substance and Sexual Activity  . Alcohol use: No  . Drug use: No  . Sexual activity: Not on file  Lifestyle  . Physical activity:    Days per week: Not on file    Minutes per session: Not on file  . Stress: Not on file  Relationships  . Social connections:    Talks on phone: Not on file    Gets together: Not on file    Attends religious service: Not on file    Active member of club or organization: Not on file    Attends meetings of clubs or organizations: Not on file    Relationship status: Not on file  . Intimate partner violence:    Fear of current or ex partner: Not on file    Emotionally abused: Not on file    Physically abused: Not on file    Forced sexual activity: Not on file  Other Topics Concern  . Not on file  Social History Narrative  . Not on file    Family History  Problem Relation Age of Onset  . Breast cancer Maternal Aunt   . Breast cancer Paternal Grandmother 72     ROS: No fever, myalgias/arthralgias, unexplained weight loss or weight gain No new focal weakness or sensory deficits No otalgia, hearing loss, visual changes, nasal and sinus symptoms, mouth and throat problems No neck pain or adenopathy No abdominal pain, N/V/D, diarrhea, change in bowel pattern No dysuria, change in urinary pattern   Vitals:   09/02/17 1030 09/02/17 1041  BP:  (!) 150/70  Pulse:  87  SpO2:  97%  Weight: 210 lb (95.3 kg)   Height: 5\' 3"  (1.6 m)      EXAM:  Gen: WDWN, No overt respiratory distress HEENT: NCAT, sclera white, oropharynx normal Neck: Supple without LAN, thyromegaly, JVD Lungs: breath sounds full, percussion normal, adventitious sounds: None Cardiovascular: RRR, no murmurs  noted Abdomen: Soft, nontender, normal BS Ext: without clubbing, cyanosis, edema Neuro: CNs grossly intact, motor and sensory intact Skin: Limited exam, no lesions noted  DATA:   BMP Latest Ref Rng & Units 08/19/2016 05/14/2015 07/02/2014  Glucose 65 - 99 mg/dL - 110(H) 130(H)  BUN mg/dL - - 22(H)  Creatinine 0.44 - 1.00 mg/dL 0.70 - 0.52  Sodium 135 - 145 mmol/L - 141 140  Potassium 3.5 - 5.1 mmol/L - 4.2 4.3  Chloride mmol/L - - 103  CO2 mmol/L - - 29  Calcium mg/dL - - 9.4    CBC Latest Ref Rng & Units 05/14/2015 08/24/2014 07/31/2011  WBC 3.6 - 11.0 K/uL - 6.3 -  Hemoglobin 12.0 - 15.0 g/dL 13.6 13.2 12.3  Hematocrit 36.0 - 46.0 % 40.0 40.1 -  Platelets 150 - 440 K/uL - 251 -    CXR: No recent CXR available for my review  I have personally reviewed all chest radiographs reported above including CXRs and CT chest unless otherwise indicated  IMPRESSION:     ICD-10-CM   1. Very mild, nonspecific findings of pulmonary fibrosis on HRCT chest J84.10   2. Chronic asthmatic bronchitis ([place]) J44.9   3. History of seasonal allergies Z88.9    The findings of "pulmonary fibrosis" on CT scan are really quite minimal.  Etiology is unclear.  Her symptoms are most  consistent with chronic asthmatic bronchitis, normal spirometry (as noted above) not withstanding. I do not presently see a pulmonary indication for chronic prednisone therapy.   PLAN:  Prednisone taper: 5 mg daily for 7 days, 5 mg every other day for 7 days, stop Recommended that she discontinue montelukast Henceforth, she should avoid nitrofurantoin altogether Begin Advair HFA 150 mcg strength, 2 inhalations daily.  Instructed to rinse her mouth after use Continue albuterol inhaler as needed Recommended that she complete her evaluations with rheumatology and cardiology She will follow-up in 6 weeks for recheck to assess her status after tapering off prednisone We will need repeat PFTs in approximately 6 months We should consider repeat HRCT chest in 1 year   [name], MD PCCM service Mobile [phone] Pager [phone] 09/07/2017 1:16 PM

## 2017-09-02 NOTE — Patient Instructions (Addendum)
Prednisone taper: 5 mg (one half tab) daily for 7 days, then 5 mg every other day for 7 days, then stop  Stop Singulair (montelukast)  You should henceforth avoid Macrobid (nitrofurantoin)  Begin Advair HFA 115 mcg strength, 2 inhalations twice a day.  Rinse mouth after use  Continue albuterol inhaler as needed for increased shortness of breath, cough, wheezing, chest tightness  Complete your evaluations with rheumatology and cardiology  Follow-up in 6 weeks for quick recheck  We will repeat lung function tests (PFTs) in 6 months  We will consider repeat CT scan of chest in 1 year

## 2017-10-15 ENCOUNTER — Ambulatory Visit: Payer: Medicare HMO | Admitting: Pulmonary Disease

## 2017-10-15 ENCOUNTER — Telehealth: Payer: Self-pay | Admitting: Pulmonary Disease

## 2017-10-15 ENCOUNTER — Encounter: Payer: Self-pay | Admitting: Pulmonary Disease

## 2017-10-15 VITALS — BP 132/68 | HR 75 | Ht 63.0 in | Wt 213.0 lb

## 2017-10-15 DIAGNOSIS — J8410 Pulmonary fibrosis, unspecified: Secondary | ICD-10-CM

## 2017-10-15 DIAGNOSIS — Z7952 Long term (current) use of systemic steroids: Secondary | ICD-10-CM | POA: Diagnosis not present

## 2017-10-15 DIAGNOSIS — J449 Chronic obstructive pulmonary disease, unspecified: Secondary | ICD-10-CM

## 2017-10-15 MED ORDER — PREDNISONE 5 MG PO TABS: 5.0000 mg | ORAL_TABLET | Freq: Every day | ORAL | 1 refills | Status: DC

## 2017-10-15 MED ORDER — FLUTICASONE-SALMETEROL 230-21 MCG/ACT IN AERO: 2.0000 | INHALATION_SPRAY | Freq: Two times a day (BID) | RESPIRATORY_TRACT | 12 refills | Status: DC

## 2017-10-15 NOTE — Telephone Encounter (Signed)
Pharmacist has a question regarding Advair. States pt has been on 115 in the past , just please verify the increase of dosage.

## 2017-10-15 NOTE — Patient Instructions (Signed)
Most importantly, I would like to get you on a lower dose or off of prednisone  Increase Advair strength to 230-15, 2 actuations twice a day.  Rinse mouth after use  Decrease prednisone to 5 mg/day and remain on that dose until reevaluated in this office  Continue albuterol inhaler as needed for increased cough, wheezing, chest tightness, shortness of breath.  I encouraged more liberal use of this medication  Follow-up in 6 to 8 weeks

## 2017-10-15 NOTE — Progress Notes (Signed)
PULMONARY OFFICE FOLLOW-UP NOTE  Requesting MD/Service: Self. Primary MD:  Date of initial consultation: 09/02/17 Reason for consultation: Pulmonary fibrosis, DOE  PT PROFILE: 72 y.o. female never smoker previously seen by Dr Raul Del with a diagnosis of pulmonary fibrosis seeks a second opinion  DATA: 07/08/17 PFTs: FVC 2.61 L, 98% predicted.  FEV1 2.17 L, 106% predicted.  FEV1 ratio 83%.  TLC 77% predicted.  DLCO 71% predicted.  DLCO/VA 125% predicted 08/03/17 HRCT chest: Very mild nonspecific interstitial changes with basilar predominance.  Multiple tiny pulmonary nodules scattered throughout the lungs bilaterally measuring 5 mm or less in size, nonspecific but statistically likely benign.   INTERVAL: No major pulmonary events  Subjective:  Since last visit, she tapered off of prednisone but then developed symptoms of a "cold" with mild cough productive of clear mucus. She resumed prednisone @ 10 mg daily. She denies chest pain, hemoptysis, fever. Her symptoms are worse when she is in her own home. She is off Singulair. She reports using Advair 115-21 reliably. She is using albuterol rescue MDI approx QOD.   Vitals:   10/15/17 0826 10/15/17 0830  BP:  132/68  Pulse:  75  SpO2:  95%  Weight: 213 lb (96.6 kg)   Height: 5\' 3"  (1.6 m)      EXAM:  Gen: NAD HEENT: NCAT, sclera white Neck: No JVD Lungs: breath sounds slightly coarse without wheezes or other adventitious sounds Cardiovascular: RRR, no murmurs Abdomen: Soft, nontender, normal BS Ext: without clubbing, cyanosis, edema Neuro: grossly intact Skin: Limited exam, no lesions noted   DATA:   BMP Latest Ref Rng & Units 08/19/2016 05/14/2015 07/02/2014  Glucose 65 - 99 mg/dL - 110(H) 130(H)  BUN mg/dL - - 22(H)  Creatinine 0.44 - 1.00 mg/dL 0.70 - 0.52  Sodium 135 - 145 mmol/L - 141 140  Potassium 3.5 - 5.1 mmol/L - 4.2 4.3  Chloride mmol/L - - 103  CO2 mmol/L - - 29  Calcium mg/dL - - 9.4    CBC Latest Ref Rng &  Units 05/14/2015 08/24/2014 07/31/2011  WBC 3.6 - 11.0 K/uL - 6.3 -  Hemoglobin 12.0 - 15.0 g/dL 13.6 13.2 12.3  Hematocrit 36.0 - 46.0 % 40.0 40.1 -  Platelets 150 - 440 K/uL - 251 -    CXR: No recent CXR available for my review  I have personally reviewed all chest radiographs reported above including CXRs and CT chest unless otherwise indicated  IMPRESSION:     ICD-10-CM   1. Chronic asthmatic bronchitis ([place]) J44.9   2. Very mild pulmonary fibrosis J84.10   3. On prednisone therapy Z79.52    Her major symptoms appear to be attributable to airways disease and I do not think can be attributed to very mild pulmonary fibrosis. I am very concerned about the long term risks of chronic prednisone therapy. Her airways symptoms appear to be steroid responsive (favorable response to prednisone)   PLAN:  Decrease prednisone to 5 mg daily and remain on this dose until follow up  Increase Advair strength to 230-15, 2 actuations BID. Encouraged use of spacer. Reminded to rinse mouth after use.   Continue albuterol inhaler as needed for increased cough, wheezing, chest tightness, shortness of breath.  I encouraged more liberal use of this medication  We will need repeat PFTs in approximately 6 months from previous PFTs  We should consider repeat HRCT chest in 1 year from previous CT chest  Follow up in 6-8 weeks at which time I hope  to taper prednisone to off   [name], MD PCCM service Mobile [phone] Pager [phone] 10/15/2017 8:35 AM

## 2017-10-15 NOTE — Telephone Encounter (Signed)
Spoke with the pharmacy and informed them that the Advair dose had been increased. Nothing further needed.

## 2017-10-25 ENCOUNTER — Ambulatory Visit: Payer: Medicare HMO | Admitting: Pulmonary Disease

## 2017-11-25 ENCOUNTER — Encounter: Payer: Self-pay | Admitting: Pulmonary Disease

## 2017-11-25 ENCOUNTER — Ambulatory Visit: Payer: Medicare HMO | Admitting: Pulmonary Disease

## 2017-11-25 VITALS — BP 118/70 | HR 73 | Resp 16 | Ht 63.0 in | Wt 208.0 lb

## 2017-11-25 DIAGNOSIS — J449 Chronic obstructive pulmonary disease, unspecified: Secondary | ICD-10-CM

## 2017-11-25 DIAGNOSIS — R05 Cough: Secondary | ICD-10-CM | POA: Diagnosis not present

## 2017-11-25 DIAGNOSIS — Z23 Encounter for immunization: Secondary | ICD-10-CM | POA: Diagnosis not present

## 2017-11-25 DIAGNOSIS — J8410 Pulmonary fibrosis, unspecified: Secondary | ICD-10-CM | POA: Diagnosis not present

## 2017-11-25 DIAGNOSIS — R053 Chronic cough: Secondary | ICD-10-CM

## 2017-11-25 DIAGNOSIS — K219 Gastro-esophageal reflux disease without esophagitis: Secondary | ICD-10-CM

## 2017-11-25 DIAGNOSIS — N390 Urinary tract infection, site not specified: Secondary | ICD-10-CM | POA: Insufficient documentation

## 2017-11-25 MED ORDER — OMEPRAZOLE 40 MG PO CPDR: 40.0000 mg | DELAYED_RELEASE_CAPSULE | Freq: Every evening | ORAL | 10 refills | Status: DC

## 2017-11-25 NOTE — Patient Instructions (Signed)
Continue prednisone at 5 mg/day for now until follow-up  Continue Advair inhaler, 2 puffs twice a day.  Use spacer device.  Rinse mouth after use  Continue albuterol inhaler as needed for increased shortness of breath, cough, wheezing, chest tightness  Change Prilosec to 40 mg daily and take at bedtime.  Prescription has been changed  Flu shot today  Follow-up in 6 weeks with lung function tests (PFTs) prior to that visit

## 2017-11-25 NOTE — Progress Notes (Signed)
PULMONARY OFFICE FOLLOW-UP NOTE  Requesting MD/Service: Self. Primary MD:  Date of initial consultation: 09/02/17 Reason for consultation: Pulmonary fibrosis, DOE  PT PROFILE: 72 y.o. female never smoker previously seen by Dr Raul Del with a diagnosis of pulmonary fibrosis seeks a second opinion  DATA: 07/08/17 PFTs: FVC 2.61 L, 98% predicted.  FEV1 2.17 L, 106% predicted.  FEV1 ratio 83%.  TLC 77% predicted.  DLCO 71% predicted.  DLCO/VA 125% predicted 08/03/17 HRCT chest: Very mild nonspecific interstitial changes with basilar predominance.  Multiple tiny pulmonary nodules scattered throughout the lungs bilaterally measuring 5 mm or less in size, nonspecific but statistically likely benign.   INTERVAL: Last visit 10/15/2017.  Since that time there have been no major pulmonary events  Subjective:  This is a scheduled follow-up.  She continues to have chronic daily cough.  This is a more significant problem to her than shortness of breath.  She describes her cough as a rattling.  However, she is unable to expectorate much sputum.  She believes that it is made worse with heat and humidity.  She is using her albuterol inhaler approximately 2 times per day with some improvement in her cough.  She questions whether she should be on a nebulizer rather than inhalers.  She remains on prednisone at 5 mg/day.   Vitals:   11/25/17 0826 11/25/17 0837  BP:  118/70  Pulse:  73  Resp: 16   SpO2:  93%  Weight: 208 lb (94.3 kg)   Height: 5\' 3"  (1.6 m)   RA  EXAM:  Gen: NAD HEENT: NCAT, sclera white Neck: No JVD Lungs: breath sounds full, no wheezes or other adventitious sounds Cardiovascular: RRR, no murmurs Abdomen: Soft, nontender, normal BS Ext: without clubbing, cyanosis, edema Neuro: grossly intact Skin: Limited exam, no lesions noted    DATA:   BMP Latest Ref Rng & Units 08/19/2016 05/14/2015 07/02/2014  Glucose 65 - 99 mg/dL - 110(H) 130(H)  BUN mg/dL - - 22(H)  Creatinine 0.44 - 1.00  mg/dL 0.70 - 0.52  Sodium 135 - 145 mmol/L - 141 140  Potassium 3.5 - 5.1 mmol/L - 4.2 4.3  Chloride mmol/L - - 103  CO2 mmol/L - - 29  Calcium mg/dL - - 9.4    CBC Latest Ref Rng & Units 05/14/2015 08/24/2014 07/31/2011  WBC 3.6 - 11.0 K/uL - 6.3 -  Hemoglobin 12.0 - 15.0 g/dL 13.6 13.2 12.3  Hematocrit 36.0 - 46.0 % 40.0 40.1 -  Platelets 150 - 440 K/uL - 251 -    CXR: No new film  I have personally reviewed all chest radiographs reported above including CXRs and CT chest unless otherwise indicated  IMPRESSION:     ICD-10-CM   1. Pulmonary fibrosis ([place]) J84.10 Pulmonary Function Test [facility] Only  2. Chronic asthmatic bronchitis ([facility]) J44.9 Pulmonary Function Test [facility] Only  3. Chronic cough R05 Pulmonary Function Test [facility] Only  4. Gastroesophageal reflux disease, esophagitis presence not specified K21.9   5. Encounter for immunization Z23 Flu vaccine HIGH DOSE PF   I continue to believe that pulmonary fibrosis is an incidental finding and not likely the cause of her major symptom of cough or her minor symptom of exertional dyspnea.  She does have history of GERD.  She takes her PPI in the morning.  We again discussed my concern about chronic daily prednisone use.   PLAN:  Continue prednisone at 5 mg/day for now until follow-up.  Continue Advair inhaler, 2 actuations twice a day.  Continue using spacer device.  Rinse mouth after use  Continue albuterol inhaler as needed for increased cough, wheezing, chest tightness, shortness of breath.  I encouraged more liberal use of this medication  Change Prilosec to 40 mg daily to be taken at dinnertime.  I have changed her prescription to reflect this recommendation  Flu shot administered today  Follow up in 6 weeks with PFTs prior to that visit   [name], MD PCCM service Mobile [phone] Pager [phone] 11/25/2017 11:46 AM

## 2017-11-26 ENCOUNTER — Telehealth: Payer: Self-pay | Admitting: Pulmonary Disease

## 2017-11-26 NOTE — Telephone Encounter (Signed)
°*  STAT* If patient is at the pharmacy, call can be transferred to refill team.   1. Which medications need to be refilled? (please list name of each medication and dose if known) omeprazole (PRILOSEC) 40 MG capsule qhs   2. Which pharmacy/location (including street and city if local pharmacy) is medication to be sent to? [facility]   3. Do they need a 30 day or 90 day supply? [facility] DID NOT RECEIVE RX

## 2017-11-26 NOTE — Telephone Encounter (Signed)
Confirmed with pharmacy that Omeprazole 40 mg was received on 11/25/17. Nothing further needed. Pt notified

## 2018-01-04 ENCOUNTER — Ambulatory Visit: Payer: Medicare HMO | Attending: Pulmonary Disease

## 2018-01-04 DIAGNOSIS — R053 Chronic cough: Secondary | ICD-10-CM

## 2018-01-04 DIAGNOSIS — R05 Cough: Secondary | ICD-10-CM

## 2018-01-04 DIAGNOSIS — J8410 Pulmonary fibrosis, unspecified: Secondary | ICD-10-CM | POA: Diagnosis present

## 2018-01-04 DIAGNOSIS — J449 Chronic obstructive pulmonary disease, unspecified: Secondary | ICD-10-CM | POA: Diagnosis present

## 2018-01-04 MED ORDER — ALBUTEROL SULFATE (2.5 MG/3ML) 0.083% IN NEBU
2.5000 mg | INHALATION_SOLUTION | Freq: Once | RESPIRATORY_TRACT | Status: AC
  Filled 2018-01-04: qty 3

## 2018-01-11 ENCOUNTER — Encounter: Payer: Self-pay | Admitting: Pulmonary Disease

## 2018-01-11 ENCOUNTER — Ambulatory Visit: Payer: Medicare HMO | Admitting: Pulmonary Disease

## 2018-01-11 VITALS — BP 140/70 | HR 79 | Ht 63.0 in | Wt 210.0 lb

## 2018-01-11 DIAGNOSIS — R0609 Other forms of dyspnea: Secondary | ICD-10-CM

## 2018-01-11 DIAGNOSIS — R0600 Dyspnea, unspecified: Secondary | ICD-10-CM

## 2018-01-11 DIAGNOSIS — J42 Unspecified chronic bronchitis: Secondary | ICD-10-CM

## 2018-01-11 DIAGNOSIS — J8410 Pulmonary fibrosis, unspecified: Secondary | ICD-10-CM | POA: Diagnosis not present

## 2018-01-11 MED ORDER — FLUTICASONE FUROATE-VILANTEROL 100-25 MCG/INH IN AEPB: 1.0000 | INHALATION_SPRAY | Freq: Every day | RESPIRATORY_TRACT | 0 refills | Status: DC

## 2018-01-11 NOTE — Progress Notes (Signed)
PULMONARY OFFICE FOLLOW-UP NOTE  Requesting MD/Service: Self. Primary MD:  Date of initial consultation: 09/02/17 Reason for consultation: Pulmonary fibrosis, DOE  PT PROFILE: 72 y.o. female never smoker previously seen by Dr Raul Del with a diagnosis of pulmonary fibrosis seeks a second opinion  DATA: 07/08/17 PFTs: FVC 2.61 L, 98% predicted.  FEV1 2.17 L, 106% predicted.  FEV1 ratio 83%.  TLC 77% predicted.  DLCO 71% predicted.  DLCO/VA 125% predicted 08/03/17 HRCT chest: Very mild nonspecific interstitial changes with basilar predominance.  Multiple tiny pulmonary nodules scattered throughout the lungs bilaterally measuring 5 mm or less in size, nonspecific but statistically likely benign.  01/04/18 PFTs: FVC: 2.56 > 2.63 L (97 > 99 %pred), FEV1: 2.08 > 2.13 L (102 > 105 %pred), FEV1/FVC: 81%, TLC: 4.29 L (87 %pred), DLCO 87 %pred    INTERVAL: Last visit 11/25/2017.  No major pulmonary events  Subjective:  This is a scheduled follow-up.  She is here to review results of repeat pulmonary function test.  She continues to have a severe exertional dyspnea with little day-to-day variation.  However, she believes that she gets improvement from her albuterol inhaler.  She also reports chronic cough with "congestion" but is not able to cough anything up.  When she performed the PFTs, she received nebulized albuterol and, although her testing did not improve significantly, she felt that it helped her breathing significantly.  She remains on prednisone at 5 mg/day.  Her biggest problem presently is severe lower back pain.  She reports that she has severe bone spurs.   Vitals:   01/11/18 0817 01/11/18 0825  BP:  140/70  Pulse:  79  SpO2:  96%  Weight: 210 lb (95.3 kg)   Height: 5\' 3"  (1.6 m)   RA  EXAM:  Gen: NAD HEENT: NCAT, sclera white Neck: No JVD Lungs: Coarse expiratory breath sounds with scattered rhonchi Cardiovascular: RRR, no murmurs Abdomen: Soft, nontender, normal BS Ext:  without clubbing, cyanosis, edema Neuro: grossly intact Skin: Limited exam, no lesions noted    DATA:   BMP Latest Ref Rng & Units 08/19/2016 05/14/2015 07/02/2014  Glucose 65 - 99 mg/dL - 110(H) 130(H)  BUN mg/dL - - 22(H)  Creatinine 0.44 - 1.00 mg/dL 0.70 - 0.52  Sodium 135 - 145 mmol/L - 141 140  Potassium 3.5 - 5.1 mmol/L - 4.2 4.3  Chloride mmol/L - - 103  CO2 mmol/L - - 29  Calcium mg/dL - - 9.4    CBC Latest Ref Rng & Units 05/14/2015 08/24/2014 07/31/2011  WBC 3.6 - 11.0 K/uL - 6.3 -  Hemoglobin 12.0 - 15.0 g/dL 13.6 13.2 12.3  Hematocrit 36.0 - 46.0 % 40.0 40.1 -  Platelets 150 - 440 K/uL - 251 -    CXR: No new film  I have personally reviewed all chest radiographs reported above including CXRs and CT chest unless otherwise indicated  IMPRESSION:     ICD-10-CM   1. DOE (dyspnea on exertion) R06.09   2. Chronic bronchitis, unspecified chronic bronchitis type ([place]) J42   3. Very mild pulmonary fibrosis J84.10    The pulmonary fibrosis is very mild and does not appear to be progressive since May of this year. Her symptoms are most suggestive of airways disease (asthma, bronchitis) Her dyspnea appears out of proportion to any objective findings I remain very concerned about daily prednisone use I remain concerned about effective utilization of aerosol MDI   PLAN:  I have provided samples of Breo inhaler.  This  is to be used in place of Advair with the hopes that she gets better delivery to her airways and therefore improved symptom control  She is to continue albuterol as needed for increased cough, wheezing, chest tightness, shortness of breath  She is to change prednisone to 5 mg every other day for 2 weeks, then stop (stop date 01/26/2018)  She is to follow-up in 4 weeks with chest x-ray prior to that visit.  [name], MD PCCM service Mobile [phone] Pager [phone] 01/11/2018 8:53 AM

## 2018-01-11 NOTE — Progress Notes (Signed)
Patient seen in the office today and instructed on use of Breo 100.  Patient expressed understanding and demonstrated technique.  Blair Hailey

## 2018-01-11 NOTE — Patient Instructions (Signed)
Change Advair inhaler to Breo.  Use 1 inhalation daily.  Rinse mouth after use.  Use up sample provided, then cycle back to Advair inhaler.  We will discuss which one is preferred at next visit.  Change prednisone to 5 mg every other day.  Take on odd number days.  Discontinue altogether on 01/26/2018.  Follow-up in 4 weeks.  Call sooner if needed

## 2018-01-11 NOTE — Addendum Note (Signed)
Addended by: Clayborne Dana C on: 01/11/2018 09:02 AM   Modules accepted: Orders

## 2018-01-17 ENCOUNTER — Other Ambulatory Visit: Payer: Self-pay | Admitting: Orthopedic Surgery

## 2018-01-17 DIAGNOSIS — G8929 Other chronic pain: Secondary | ICD-10-CM

## 2018-01-17 DIAGNOSIS — M545 Low back pain, unspecified: Secondary | ICD-10-CM

## 2018-01-24 ENCOUNTER — Telehealth: Payer: Self-pay | Admitting: Pulmonary Disease

## 2018-01-24 NOTE — Telephone Encounter (Signed)
Patient calling stating she is doing a whole lot better by her being on Brio   Would like [place] to please call this in  She uses [facility]  Please refill

## 2018-01-26 NOTE — Telephone Encounter (Signed)
Patient calling asking when she may hear back from our office  Please call back

## 2018-01-28 ENCOUNTER — Encounter
Admission: RE | Admit: 2018-01-28 | Discharge: 2018-01-28 | Disposition: A | Discharge status: Home / Self Care | Payer: Medicare HMO | Source: Ambulatory Visit | Attending: Orthopedic Surgery | Admitting: Orthopedic Surgery

## 2018-01-28 ENCOUNTER — Other Ambulatory Visit: Payer: Self-pay | Admitting: Pulmonary Disease

## 2018-01-28 DIAGNOSIS — M545 Low back pain: Secondary | ICD-10-CM | POA: Diagnosis not present

## 2018-01-28 DIAGNOSIS — G8929 Other chronic pain: Secondary | ICD-10-CM | POA: Diagnosis present

## 2018-01-28 MED ORDER — FLUTICASONE FUROATE-VILANTEROL 100-25 MCG/INH IN AEPB: 1.0000 | INHALATION_SPRAY | Freq: Every day | RESPIRATORY_TRACT | 10 refills | Status: DC

## 2018-01-28 MED ORDER — TECHNETIUM TC 99M MEDRONATE IV KIT
24.4300 | PACK | Freq: Once | INTRAVENOUS | Status: AC | PRN
  Administered 2018-01-28: 24.43 via INTRAVENOUS

## 2018-01-28 NOTE — Telephone Encounter (Signed)
Breo reordered  [employer]

## 2018-02-10 ENCOUNTER — Ambulatory Visit
Admission: RE | Admit: 2018-02-10 | Discharge: 2018-02-10 | Disposition: A | Discharge status: Home / Self Care | Payer: Medicare HMO | Source: Ambulatory Visit | Attending: Pulmonary Disease | Admitting: Pulmonary Disease

## 2018-02-10 ENCOUNTER — Encounter: Payer: Self-pay | Admitting: Pulmonary Disease

## 2018-02-10 ENCOUNTER — Ambulatory Visit: Payer: Medicare HMO | Admitting: Pulmonary Disease

## 2018-02-10 VITALS — BP 140/68 | HR 83 | Ht 63.0 in | Wt 207.0 lb

## 2018-02-10 DIAGNOSIS — I700 Atherosclerosis of aorta: Secondary | ICD-10-CM | POA: Diagnosis not present

## 2018-02-10 DIAGNOSIS — J8410 Pulmonary fibrosis, unspecified: Secondary | ICD-10-CM | POA: Diagnosis not present

## 2018-02-10 DIAGNOSIS — J42 Unspecified chronic bronchitis: Secondary | ICD-10-CM | POA: Diagnosis present

## 2018-02-10 DIAGNOSIS — R0609 Other forms of dyspnea: Secondary | ICD-10-CM | POA: Insufficient documentation

## 2018-02-10 DIAGNOSIS — J449 Chronic obstructive pulmonary disease, unspecified: Secondary | ICD-10-CM

## 2018-02-10 DIAGNOSIS — R0600 Dyspnea, unspecified: Secondary | ICD-10-CM

## 2018-02-10 MED ORDER — FLUTICASONE FUROATE-VILANTEROL 100-25 MCG/INH IN AEPB: 1.0000 | INHALATION_SPRAY | Freq: Every day | RESPIRATORY_TRACT | 0 refills | Status: DC

## 2018-02-10 MED ORDER — FLUTICASONE FUROATE-VILANTEROL 100-25 MCG/INH IN AEPB: 1.0000 | INHALATION_SPRAY | Freq: Every day | RESPIRATORY_TRACT | 10 refills | Status: DC

## 2018-02-10 NOTE — Progress Notes (Signed)
PULMONARY OFFICE FOLLOW-UP NOTE  Requesting MD/Service: Self. Primary MD:  Date of initial consultation: 09/02/17 Reason for consultation: Pulmonary fibrosis, DOE  PT PROFILE: 72 y.o. female never smoker previously seen by Dr Raul Del with a diagnosis of pulmonary fibrosis seeks a second opinion  DATA: 07/08/17 PFTs: FVC 2.61 L, 98% predicted.  FEV1 2.17 L, 106% predicted.  FEV1 ratio 83%.  TLC 77% predicted.  DLCO 71% predicted.  DLCO/VA 125% predicted 08/03/17 HRCT chest: Very mild nonspecific interstitial changes with basilar predominance.  Multiple tiny pulmonary nodules scattered throughout the lungs bilaterally measuring 5 mm or less in size, nonspecific but statistically likely benign.  01/04/18 PFTs: FVC: 2.56 > 2.63 L (97 > 99 %pred), FEV1: 2.08 > 2.13 L (102 > 105 %pred), FEV1/FVC: 81%, TLC: 4.29 L (87 %pred), DLCO 87 %pred    INTERVAL: Last visit 01/11/18.  No major pulmonary events  Subjective:  This is a scheduled follow-up.  Last visit, I instructed her to taper off of prednisone which she has done with no significant deterioration in her respiratory symptoms.  Last visit, we changed Advair HFA to [facility] with the thinking that there might be improved delivery of medications to her airways.  With this change, she was substantially improved.  She used up the provided sample and called here to ask for prescription to be entered.  This was done but, apparently, she was not informed.  Therefore, at this time, she is back on the Advair HFA and continues to have exertional dyspnea.  She was instructed to get a chest x-ray before this visit.  This has not been done.  At the present time, she denies CP, fever, purulent sputum, hemoptysis, LE edema and calf tenderness.   Vitals:   02/10/18 0841 02/10/18 0849  BP:  140/68  Pulse:  83  SpO2:  94%  Weight: 207 lb (93.9 kg)   Height: 5\' 3"  (1.6 m)   RA  EXAM:  Gen: NAD HEENT: NCAT, sclera white Neck: No JVD Lungs: breath sounds full,  no wheezes or other adventitious sounds Cardiovascular: RRR, no murmurs Abdomen: Soft, nontender, normal BS Ext: without clubbing, cyanosis, edema Neuro: grossly intact Skin: Limited exam, no lesions noted   DATA:   BMP Latest Ref Rng & Units 08/19/2016 05/14/2015 07/02/2014  Glucose 65 - 99 mg/dL - 110(H) 130(H)  BUN mg/dL - - 22(H)  Creatinine 0.44 - 1.00 mg/dL 0.70 - 0.52  Sodium 135 - 145 mmol/L - 141 140  Potassium 3.5 - 5.1 mmol/L - 4.2 4.3  Chloride mmol/L - - 103  CO2 mmol/L - - 29  Calcium mg/dL - - 9.4    CBC Latest Ref Rng & Units 05/14/2015 08/24/2014 07/31/2011  WBC 3.6 - 11.0 K/uL - 6.3 -  Hemoglobin 12.0 - 15.0 g/dL 13.6 13.2 12.3  Hematocrit 36.0 - 46.0 % 40.0 40.1 -  Platelets 150 - 440 K/uL - 251 -    CXR: Pending  I have personally reviewed all chest radiographs reported above including CXRs and CT chest unless otherwise indicated  IMPRESSION:     ICD-10-CM   1. Chronic asthmatic bronchitis ([place]) J44.9   2. Very mild pulmonary fibrosis ([facility]) J84.10    The pulmonary fibrosis is very mild and does not appear to be progressive since May of this year. Her significant improvement with Breo inhaler confirms that there is a significant component of asthmatic bronchitis.   PLAN:  Discontinue Advair HFA Continue (resume) Breo inhaler 100/25 - one inhalation daily.in the  morning. Rinse mouth after use Continue albuterol inhaler as needed for wheezing, cough or increased shortness of breath Follow up in 3-4 months. Call sooner if needed  [name], MD PCCM service Mobile [phone] Pager [phone] 02/10/2018 9:14 AM

## 2018-02-10 NOTE — Patient Instructions (Signed)
Continue (resume) Breo inhaler - one inhalation daily.in the morning. Rinse mouth after use Continue albuterol inhaler as needed for wheezing, cough or increased shortness of breath Follow up in 3-4 months. Call sooner if needed

## 2018-04-29 ENCOUNTER — Other Ambulatory Visit: Payer: Self-pay | Admitting: Family Medicine

## 2018-05-02 ENCOUNTER — Other Ambulatory Visit: Payer: Self-pay | Admitting: Family Medicine

## 2018-05-02 DIAGNOSIS — E278 Other specified disorders of adrenal gland: Secondary | ICD-10-CM

## 2018-05-02 DIAGNOSIS — N1330 Unspecified hydronephrosis: Secondary | ICD-10-CM

## 2018-05-06 ENCOUNTER — Ambulatory Visit
Admission: RE | Admit: 2018-05-06 | Discharge: 2018-05-06 | Disposition: A | Discharge status: Home / Self Care | Payer: Medicare HMO | Source: Ambulatory Visit | Attending: Family Medicine | Admitting: Family Medicine

## 2018-05-06 DIAGNOSIS — N1330 Unspecified hydronephrosis: Secondary | ICD-10-CM | POA: Diagnosis present

## 2018-05-06 DIAGNOSIS — E278 Other specified disorders of adrenal gland: Secondary | ICD-10-CM

## 2018-05-24 ENCOUNTER — Encounter: Payer: Self-pay | Admitting: Urology

## 2018-05-24 ENCOUNTER — Other Ambulatory Visit: Payer: Self-pay

## 2018-05-24 ENCOUNTER — Ambulatory Visit: Payer: Medicare HMO | Admitting: Urology

## 2018-05-24 VITALS — BP 148/72 | HR 90 | Ht 63.0 in | Wt 203.0 lb

## 2018-05-24 DIAGNOSIS — N3946 Mixed incontinence: Secondary | ICD-10-CM

## 2018-05-24 DIAGNOSIS — D3502 Benign neoplasm of left adrenal gland: Secondary | ICD-10-CM

## 2018-05-24 DIAGNOSIS — N1339 Other hydronephrosis: Secondary | ICD-10-CM

## 2018-05-24 DIAGNOSIS — N390 Urinary tract infection, site not specified: Secondary | ICD-10-CM | POA: Diagnosis not present

## 2018-05-24 DIAGNOSIS — N281 Cyst of kidney, acquired: Secondary | ICD-10-CM | POA: Diagnosis not present

## 2018-05-24 LAB — URINALYSIS, COMPLETE
Bilirubin, UA: POSITIVE — AB
Glucose, UA: NEGATIVE
Leukocytes, UA: NEGATIVE
Nitrite, UA: NEGATIVE
PH UA: 5.5 (ref 5.0–7.5)
Protein, UA: NEGATIVE
RBC, UA: NEGATIVE
Specific Gravity, UA: 1.02 (ref 1.005–1.030)
Urobilinogen, Ur: 0.2 mg/dL (ref 0.2–1.0)

## 2018-05-24 LAB — MICROSCOPIC EXAMINATION
Bacteria, UA: NONE SEEN
RBC MICROSCOPIC, UA: NONE SEEN /HPF (ref 0–2)

## 2018-05-24 MED ORDER — MIRABEGRON ER 50 MG PO TB24: 50.0000 mg | ORAL_TABLET | Freq: Every day | ORAL | 0 refills | Status: DC

## 2018-05-24 NOTE — Progress Notes (Signed)
05/24/2018  9:25 AM   Alpha 10-10-1945 [id number]  Referring provider: Maryland Pink, MD [address] [facility] [place], [place] [address]  Chief Complaint  Patient presents with  . Hydronephrosis    New Patient    HPI: Tiffany Alexander is a 73 yo F who presents today for the evaluation and management of a left adrenal mass and hydronephrosis. She was referred to [place] by Maryland Pink, MD. She is accompanied by her daughter.   She underwent noncontrast CT abdomen pelvis scan from 05/06/2018 showed stable left adrenal adenoma and chronic bilateral hydronephrosis (minimal change since 2013).  These were incidentally identified on CT myelogram and followed up with CT urogram as ordered by her primary care physician.  The mammogram also made report of a 4.8 cm left upper pole anterior mass.  Her potassium level is stable at 4.2 from 04/25/2018. She denies palpitations and anxiety. She does have hypertension and high blood pressure that she takes medicine for and she does have risk factors.   She denies any flank pain.  Her renal function is normal.  She initially saw Dr. Jacqlyn Larsen and transferred care over to [facility]  regarding her bladder symptoms and recurrent UTIs but has not been seen in numerous years.  She reports of previous UTIs that was treated with Macrobid for suppressive antibiotics for several years but then developed pulmonary fibrosis.  This was stopped secondary to this.  She subsequently been on trimethoprim and only daily.  She reports prior to being on suppression, she was getting UTIs every 6 to 8 weeks which were severe in nature.  She is currently not using any topical estrogen cream or any other intervention for antibiotics.  Has not had a UTI in several years.  She has a personal history of severe refractory urinary symptoms as well as presumed pelvic organ prolapse based on the patient's history.  She reports of severe urinary incontinence and switches pads  throughout the day. She denies leakage with laughing, coughing, and sneezing. She does have urinary urgency and frequency. She reports of nocturia 2-3x.  These are stably severe and unchanged.  She did have a interstim placed at [facility] 10 years ago.  Symptoms that she tried Botox which was not effective.  She did take oral medications prior to interstim including oxybutynin.   PMH: Past Medical History:  Diagnosis Date  . Arthritis   . Depression   . Diabetes mellitus without complication ([place])   . Diverticulosis   . GERD (gastroesophageal reflux disease)   . Hypertension   . PONV (postoperative nausea and vomiting)   . PONV (postoperative nausea and vomiting)   . SUI (stress urinary incontinence, female)   . Wears dentures    full upper and lower    Surgical History: Past Surgical History:  Procedure Laterality Date  .  endocervical curettage    . ABDOMINAL HYSTERECTOMY    . BLADDER SUSPENSION    . CARPAL TUNNEL RELEASE Left   . CARPAL TUNNEL RELEASE Right 05/14/2015   Procedure: CARPAL TUNNEL RELEASE;  Surgeon: Hessie Knows, MD;  Location: [facility] ORS;  Service: Orthopedics;  Laterality: Right;  . CATARACT EXTRACTION W/PHACO Right 12/17/2015   Procedure: CATARACT EXTRACTION PHACO AND INTRAOCULAR LENS PLACEMENT ([place]);  Surgeon: Birder Robson, MD;  Location: [facility] ORS;  Service: Ophthalmology;  Laterality: Right;  [place] 52.0 AP% 15.4 CDE 7.99 Fluid pack lot # [id number]  . CATARACT EXTRACTION W/PHACO Left 01/21/2016   Procedure: CATARACT EXTRACTION  PHACO AND INTRAOCULAR LENS PLACEMENT (IOC);  Surgeon: Birder Robson, MD;  Location: [facility] ORS;  Service: Ophthalmology;  Laterality: Left;  [place] 50.4 AP% 19.6 CDE 9.92 FLUID PACK LOT # [id number]  . CESAREAN SECTION N/A   . COLONOSCOPY N/A 08/24/2014   Procedure: COLONOSCOPY;  Surgeon: Lollie Sails, MD;  Location[facility] ENDOSCOPY;  Service: Endoscopy;  Laterality: N/A;  . COLONOSCOPY WITH PROPOFOL N/A 04/05/2015   Procedure: COLONOSCOPY  WITH PROPOFOL;  Surgeon: Lollie Sails, MD;  Location: [facility] ENDOSCOPY;  Service: Endoscopy;  Laterality: N/A;  . COLPORRHAPHY    . EYE SURGERY    . FINGER ARTHRODESIS Right 07/12/2014   Procedure: Right middle finger DIP fussion;  Surgeon: Hessie Knows, MD;  Location: [facility] ORS;  Service: Orthopedics;  Laterality: Right;  . FRACTURE SURGERY    . fractured wrist Right   . HAMMER TOE SURGERY Left 04/01/2016   Procedure: HAMMER TOE CORRECTION  Left 2nd  & 3rd toe;  Surgeon: [name], DPM;  Location: [facility];  Service: Podiatry;  Laterality: Left;  Special:  Hammer lock implants Left 2nd & 3rd IVA LOcal Diabetic - oral meds  . OPEN REDUCTION INTERNAL FIXATION (ORIF) METACARPAL Left 05/14/2015   Procedure: OPEN REDUCTION INTERNAL FIXATION (ORIF) METACARPAL;  Surgeon: Hessie Knows, MD;  Location: [facility] ORS;  Service: Orthopedics;  Laterality: Left;  . SEPTOPLASTY    . TUBAL LIGATION      Home Medications:  Allergies as of 05/24/2018      Reactions   Codeine Itching   Erythromycin Other (See Comments)   Stomach cramps   Penicillins Rash   Has patient had a PCN reaction causing immediate rash, facial/tongue/throat swelling, SOB or lightheadedness with hypotension: Yes Has patient had a PCN reaction causing severe rash involving mucus membranes or skin necrosis: Unknown Has patient had a PCN reaction that required hospitalization No Has patient had a PCN reaction occurring within the last 10 years: No If all of the above answers are "NO", then may proceed with Cephalosporin use.      Medication List       Accurate as of May 24, 2018  9:25 AM. Always use your most recent med list.        amLODipine 10 MG tablet Commonly known as:  NORVASC Take 10 mg by mouth at bedtime.   ascorbic acid 500 MG tablet Commonly known as:  VITAMIN C Take 500 mg by mouth daily.   atenolol 100 MG tablet Commonly known as:  TENORMIN Take 100 mg by mouth daily.   calcium  citrate-vitamin D 315-200 MG-UNIT tablet Commonly known as:  CITRACAL+D Take 1 tablet by mouth 2 (two) times daily.   citalopram 10 MG tablet Commonly known as:  CELEXA Take 10 mg by mouth at bedtime.   diphenoxylate-atropine 2.5-0.025 MG tablet Commonly known as:  LOMOTIL Take 1 tablet by mouth 4 (four) times daily as needed for diarrhea or loose stools.   etodolac 400 MG tablet Commonly known as:  LODINE Take 400 mg by mouth 2 (two) times daily as needed for mild pain.   fexofenadine 180 MG tablet Commonly known as:  ALLEGRA Take 180 mg by mouth daily.   fluticasone 50 MCG/ACT nasal spray Commonly known as:  FLONASE Place 2 sprays into both nostrils 2 (two) times daily as needed for allergies or rhinitis.   fluticasone furoate-vilanterol 100-25 MCG/INH Aepb Commonly known as:  Breo Ellipta Inhale 1 puff into the lungs daily.   fluticasone furoate-vilanterol 100-25  MCG/INH Aepb Commonly known as:  Breo Ellipta Inhale 1 puff into the lungs daily.   furosemide 20 MG tablet Commonly known as:  LASIX Take 20 mg by mouth daily.   HYDROcodone-acetaminophen 5-325 MG tablet Commonly known as:  Norco Take 1-2 tablets by mouth every 6 (six) hours as needed for moderate pain.   losartan 100 MG tablet Commonly known as:  COZAAR Take 100 mg by mouth at bedtime. Reported on 05/14/2015   Magnesium 200 MG Tabs Take by mouth 3 (three) times daily as needed.   Melatonin 10 MG Caps Take 20 mg by mouth at bedtime as needed.   metFORMIN 500 MG tablet Commonly known as:  GLUCOPHAGE Take 500 mg by mouth 2 (two) times daily with a meal.   omeprazole 40 MG capsule Commonly known as:  PriLOSEC Take 1 capsule (40 mg total) by mouth Nightly.   rosuvastatin 5 MG tablet Commonly known as:  CRESTOR Take 5 mg by mouth at bedtime.   traMADol 50 MG tablet Commonly known as:  ULTRAM Take by mouth.   traZODone 100 MG tablet Commonly known as:  DESYREL Take 100 mg by mouth at bedtime.    trimethoprim 100 MG tablet Commonly known as:  TRIMPEX Take 100 mg by mouth daily.   VITAMIN B 12 PO Take 1 tablet by mouth daily.   Vitamin D3 25 MCG (1000 UT) Caps Take by mouth daily.       Allergies:  Allergies  Allergen Reactions  . Codeine Itching  . Erythromycin Other (See Comments)    Stomach cramps  . Penicillins Rash    Has patient had a PCN reaction causing immediate rash, facial/tongue/throat swelling, SOB or lightheadedness with hypotension: Yes Has patient had a PCN reaction causing severe rash involving mucus membranes or skin necrosis: Unknown Has patient had a PCN reaction that required hospitalization No Has patient had a PCN reaction occurring within the last 10 years: No If all of the above answers are "NO", then may proceed with Cephalosporin use.     Family History: Family History  Problem Relation Age of Onset  . Breast cancer Maternal Aunt   . Breast cancer Paternal Grandmother 64    Social History:  reports that she has never smoked. She has never used smokeless tobacco. She reports that she does not drink alcohol or use drugs.  ROS: UROLOGY Frequent Urination?: Yes Hard to postpone urination?: Yes Burning/pain with urination?: Yes Get up at night to urinate?: Yes Leakage of urine?: Yes Urine stream starts and stops?: No Trouble starting stream?: No Do you have to strain to urinate?: No Blood in urine?: No Urinary tract infection?: No Sexually transmitted disease?: Yes Injury to kidneys or bladder?: No Painful intercourse?: No Weak stream?: Yes Currently pregnant?: No Vaginal bleeding?: No Last menstrual period?: n  Gastrointestinal Nausea?: No Vomiting?: No Indigestion/heartburn?: No Diarrhea?: No Constipation?: No  Constitutional Fever: No Night sweats?: No Weight loss?: No Fatigue?: No  Skin Skin rash/lesions?: No Itching?: Yes  Eyes Blurred vision?: No Double vision?: No  Ears/Nose/Throat Sore throat?: No  Sinus problems?: No  Hematologic/Lymphatic Swollen glands?: No Easy bruising?: Yes  Cardiovascular Leg swelling?: No Chest pain?: No  Respiratory Cough?: Yes Shortness of breath?: Yes  Endocrine Excessive thirst?: Yes  Musculoskeletal Back pain?: Yes Joint pain?: Yes  Neurological Headaches?: No Dizziness?: No  Psychologic Depression?: No Anxiety?: No  Physical Exam: BP (!) 148/72   Pulse 90   Ht 5\' 3"  (1.6 m)   Wt  203 lb (92.1 kg)   BMI 35.96 kg/m   Constitutional:  Alert and oriented, No acute distress.  Accompanied by daughter today.  Patient is a reasonable historian but often is confused about timeline. HEENT:  AT, moist mucus membranes.  Trachea midline, no masses. Cardiovascular: No clubbing, cyanosis, or edema. Respiratory: Normal respiratory effort, no increased work of breathing. GU: No CVA tenderness, interstim placed  Skin: No rashes, bruises or suspicious lesions. Neurologic: Grossly intact, no focal deficits, moving all 4 extremities. Psychiatric: Normal mood and affect.  Laboratory Data: Cr 0.7 on 2/20  Urinalysis  UA today negative.   Pertinent Imaging: CLINICAL DATA:  Follow-up left adrenal lesion.  Hydronephrosis.  EXAM: CT ABDOMEN AND PELVIS WITHOUT CONTRAST  TECHNIQUE: Multidetector CT imaging of the abdomen and pelvis was performed following the standard protocol without IV contrast.  COMPARISON:  CT chest 08/03/2017 and ultrasound 10/23/2011  FINDINGS: Lower chest: Stable 5 mm pleural-based nodule in the right lower lobe on sequence 3, image 29. Subtle nodularity along the left hemidiaphragm on sequence 3, image 37 is stable. No large pleural effusions. Stable tubular structure in the right middle lobe could represent chronic mucoid impaction. There is probably adjacent atelectasis associated this mucoid impaction. Coronary artery calcifications. Stable punctate nodular structure in the right middle lobe on sequence  3, image 12.  Hepatobiliary: Normal appearance of the liver and gallbladder. No biliary dilatation.  Pancreas: Unremarkable. No pancreatic ductal dilatation or surrounding inflammatory changes.  Spleen: Normal in size without focal abnormality.  Adrenals/Urinary Tract: Normal right adrenal gland. Stable low-density nodule involving the left adrenal gland that measures up to 1.9 cm. Hounsfield units are less than 0. Findings are most compatible with a benign left adrenal adenoma. Exophytic low-density cyst in the left kidney upper pole measures roughly 4.9 cm. Low-density structures throughout the left renal sinus may represent renal sinus cysts but difficult to exclude chronic mild-to-moderate hydronephrosis. Findings are similar to the ultrasound study from 2013. Limited evaluation on this noncontrast examination. There is no significant dilatation of the left ureter. Low-density structures in the mid and lower right kidney may also represent renal sinus cysts and distribution is similar to the previous ultrasound. No significant dilatation of the right ureter. Urinary bladder is decompressed.  Stomach/Bowel: Normal appearance of stomach and small bowel. Oral contrast in the small and large bowel. Normal appendix. Negative for focal bowel inflammation or obstruction.  Vascular/Lymphatic: Atherosclerotic calcifications in the aorta and iliac arteries without aortic aneurysm. No significant lymph node enlargement in the abdomen or pelvis.  Reproductive: Status post hysterectomy. No adnexal masses.  Other: Negative for ascites. Negative for free air. Tiny umbilical hernia containing fat.  Musculoskeletal: Neuro stimulator device extending through the right side of the sacrum and the generator is located in the right lower back. Multilevel degenerative facet disease in the lumbar spine. Grade 1 anterolisthesis at L4-L5 appears secondary to facet arthropathy.   IMPRESSION: 1. No acute abnormality in the abdomen or pelvis. 2. Stable left adrenal adenoma. 3. Low-density structures in the renal sinus regions bilaterally. Findings could represent bilateral renal sinus cysts but difficult to exclude chronic bilateral hydronephrosis on this non contrast examination. Minimal change since 2013. 4. Stable small nodular densities at the lung bases. Persistent area of mucoid impaction in the right middle lobe.   Electronically Signed   By: Markus Daft M.D.   On: 05/06/2018 13:26  I have personally reviewed the images and agree with radiologist interpretation.   Assessment & Plan:  1. Left adrenal adenoma   CT scan from 05/06/2018 showed stable left adrenal adenoma  Educated pt regarding adrenal adenoma including possible secretion of excessive hormones  Denies hot flashes, anxiety, palpitations and flushing; Potassium level stable; She has risk factors for high blood pressure and hypertension  She is not interested in referral for metabolic evaluation with endocrinology  2. 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 Recurrent UTI Pt reports of recurrent infections however no evidence of infections in the recent past  Discussed AUA guidelines regarding rUTIs Recommended application of topical estrogen cream MWF and encouraged cessation of suppressive antibiotics given no evidence of infections  UA today negative  Pt extremily hesitant to pursue this recommendation.   4. Mixed urinary Incontinence  Severe refractory incontinence despite second line therapy including botox and InterStim in the past and never tried [employer] [facility]  of Myrbetriq 25 mg daily, # 28 samples given; I have advised the patient of the side effects of Myrbetriq, such as: elevation in BP, urinary retention and/or HA  F/u in 6 weeks with MacDiarmid assess whether the InterStim still has a functional battery and can be manipulated/optimized  5. Urinary urgency/frequency See 4  6. Nocturia  See 4 Nocturia 2-3x   7.  Left renal cyst There is a hypodense structure measuring 4.9 cm on the left kidney mention on CT myelogram which was unfortunately not further characterized with a noncontrast CT scan Based on previous renal ultrasound 2013, there is a cyst on the left kidney that measured 5.1 cm at the time was consistent with simple renal cyst in the same location No further evaluation of renal cyst, Bosniak 1  Return in about 6 weeks (around 07/05/2018) for f/u with MacDermid .  [facility] [address] [place], [place] [address] [phone]  I, Lucas Mallow, am acting as a scribe for Dr. Hollice Espy,  I have reviewed the above documentation for accuracy and completeness, and I agree with the above.   Hollice Espy, MD    I spent 60 min with this patient of which greater than 50% was spent in counseling and coordination of care with the patient.

## 2018-06-08 ENCOUNTER — Telehealth: Payer: Self-pay | Admitting: Pulmonary Disease

## 2018-06-08 NOTE — Telephone Encounter (Signed)
Pt is aware of below results and voiced her understanding. Nothing further is needed at this time.

## 2018-06-08 NOTE — Telephone Encounter (Signed)
lmtcb x1 for pt. 

## 2018-06-08 NOTE — Telephone Encounter (Signed)
The CXR from December is normal and a CT scan of the abdomen from February shows no significant abnormalities (no pulmonary fibrosis) in the lower portions of the lungs  Thanks  Waunita Schooner

## 2018-06-08 NOTE — Telephone Encounter (Signed)
Pt was scheduled on 06/14/2018 but Dr. [name] will not be in office and we rescheduled for July because pt is doing fine and does not feel she need to come in. Pt wants to know if she can be called with the results of her previous test? If not, we will need to schedule a sooner televisit because she would like those results sooner than July.

## 2018-06-08 NOTE — Telephone Encounter (Signed)
Dr. [name] please advise on CXR results. Thanks Pt's OV has been rescheduled for 09/07/18.

## 2018-06-14 ENCOUNTER — Ambulatory Visit: Payer: Medicare HMO | Admitting: Pulmonary Disease

## 2018-06-16 ENCOUNTER — Other Ambulatory Visit: Payer: Self-pay | Admitting: Pulmonary Disease

## 2018-06-27 ENCOUNTER — Other Ambulatory Visit: Payer: Self-pay

## 2018-06-27 DIAGNOSIS — N3946 Mixed incontinence: Secondary | ICD-10-CM

## 2018-06-27 MED ORDER — MIRABEGRON ER 50 MG PO TB24: 50.0000 mg | ORAL_TABLET | Freq: Every day | ORAL | 3 refills | Status: DC

## 2018-07-11 ENCOUNTER — Ambulatory Visit: Payer: Medicare HMO | Admitting: Urology

## 2018-07-25 ENCOUNTER — Other Ambulatory Visit: Payer: Self-pay | Admitting: Family Medicine

## 2018-07-25 DIAGNOSIS — Z1231 Encounter for screening mammogram for malignant neoplasm of breast: Secondary | ICD-10-CM

## 2018-07-27 DIAGNOSIS — B351 Tinea unguium: Secondary | ICD-10-CM | POA: Diagnosis not present

## 2018-07-27 DIAGNOSIS — M79674 Pain in right toe(s): Secondary | ICD-10-CM | POA: Diagnosis not present

## 2018-07-27 DIAGNOSIS — M79675 Pain in left toe(s): Secondary | ICD-10-CM | POA: Diagnosis not present

## 2018-08-15 ENCOUNTER — Ambulatory Visit: Payer: Medicare HMO | Admitting: Urology

## 2018-08-15 ENCOUNTER — Encounter: Payer: Self-pay | Admitting: Urology

## 2018-09-01 ENCOUNTER — Other Ambulatory Visit: Payer: Self-pay

## 2018-09-01 ENCOUNTER — Ambulatory Visit
Admission: RE | Admit: 2018-09-01 | Discharge: 2018-09-01 | Disposition: A | Discharge status: Home / Self Care | Payer: Medicare HMO | Source: Ambulatory Visit | Attending: Family Medicine | Admitting: Family Medicine

## 2018-09-01 DIAGNOSIS — Z1231 Encounter for screening mammogram for malignant neoplasm of breast: Secondary | ICD-10-CM

## 2018-09-05 ENCOUNTER — Telehealth: Payer: Self-pay | Admitting: Pulmonary Disease

## 2018-09-05 NOTE — Telephone Encounter (Signed)
Called patient for COVID-19 pre-screening for in office visit. ° °Have you recently traveled any where out of the local area in the last 2 weeks? No ° °Have you been in close contact with a person diagnosed with COVID-19 or someone awaiting results within the last 2 weeks? No ° °Do you currently have any of the following symptoms? If so, when did they start? °Cough     Diarrhea   Joint Pain °Fever      Muscle Pain   Red eyes °Shortness of breath   Abdominal pain  Vomiting °Loss of smell    Rash    Sore Throat °Headache    Weakness   Bruising or bleeding ° ° °Okay to proceed with visit 09/06/2018 °  ° ° °

## 2018-09-07 ENCOUNTER — Ambulatory Visit (INDEPENDENT_AMBULATORY_CARE_PROVIDER_SITE_OTHER): Payer: Medicare HMO | Admitting: Pulmonary Disease

## 2018-09-07 ENCOUNTER — Ambulatory Visit: Payer: Medicare HMO | Admitting: Pulmonary Disease

## 2018-09-07 DIAGNOSIS — J8410 Pulmonary fibrosis, unspecified: Secondary | ICD-10-CM

## 2018-09-07 DIAGNOSIS — J449 Chronic obstructive pulmonary disease, unspecified: Secondary | ICD-10-CM

## 2018-09-07 NOTE — Progress Notes (Signed)
PULMONARY OFFICE FOLLOW-UP NOTE  Requesting MD/Service: Self. Primary MD:  Date of initial consultation: 09/02/17 Reason for consultation: DOE  PT PROFILE: 73 y.o. female never smoker previously seen by Dr Raul Del for dyspnea. She reported that she has been given a diagnosis of pulmonary fibrosis and seeks a second opinion.   DATA: 07/08/17 PFTs: FVC 2.61 L, 98% predicted.  FEV1 2.17 L, 106% predicted.  FEV1 ratio 83%.  TLC 77% predicted.  DLCO 71% predicted.  DLCO/VA 125% predicted 08/03/17 HRCT chest: Very mild nonspecific interstitial changes with basilar predominance.  Multiple tiny pulmonary nodules scattered throughout the lungs bilaterally measuring 5 mm or less in size, nonspecific but statistically likely benign.  01/04/18 PFTs: FVC: 2.56 > 2.63 L (97 > 99 %pred), FEV1: 2.08 > 2.13 L (102 > 105 %pred), FEV1/FVC: 81%, TLC: 4.29 L (87 %pred), DLCO 87 %pred    INTERVAL: Last visit 02/10/18.  No major pulmonary events  Virtual Visit via Telephone Note I connected with Tiffany Alexander on 09/08/18 at  8:30 AM EDT by telephone and verified that I am speaking with the correct person using two identifiers. I discussed the limitations, risks, security and privacy concerns of performing an evaluation and management service by telephone and the availability of in person appointments. I also discussed with the patient that there may be a patient responsible charge related to this service. The patient expressed understanding and agreed to proceed.   Subjective:  Last visit in December 2019 she has been essentially stable.  She states that she has "good and bad days".  She remains on Breo inhaler daily and believes it is of some benefit.  She uses albuterol 0-2 times per day.  She denies CP, fever, purulent sputum, hemoptysis, LE edema and calf tenderness.  Overall she is satisfied with her current status and does not feel that further evaluation or treatment is warranted at this time.   There  were no vitals filed for this visit.  EXAM:  Due to the remote nature of this, no physical examination could be performed   DATA:   BMP Latest Ref Rng & Units 08/19/2016 05/14/2015 07/02/2014  Glucose 65 - 99 mg/dL - 110(H) 130(H)  BUN mg/dL - - 22(H)  Creatinine 0.44 - 1.00 mg/dL 0.70 - 0.52  Sodium 135 - 145 mmol/L - 141 140  Potassium 3.5 - 5.1 mmol/L - 4.2 4.3  Chloride mmol/L - - 103  CO2 mmol/L - - 29  Calcium mg/dL - - 9.4    CBC Latest Ref Rng & Units 05/14/2015 08/24/2014 07/31/2011  WBC 3.6 - 11.0 K/uL - 6.3 -  Hemoglobin 12.0 - 15.0 g/dL 13.6 13.2 12.3  Hematocrit 36.0 - 46.0 % 40.0 40.1 -  Platelets 150 - 440 K/uL - 251 -    CXR 02/10/18: No acute findings  I have personally reviewed all chest radiographs reported above including CXRs and CT chest unless otherwise indicated  IMPRESSION:     ICD-10-CM   1. Chronic asthmatic bronchitis ([place])  J44.9   2. 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  Rinse mouth after use. Continue albuterol inhaler as needed for increased shortness of breath, wheezing, cough Follow-up in 6 months with Dr. Patsey Berthold.  Call sooner if needed.  [name], MD PCCM service Mobile [phone] Pager [phone] 09/08/2018 10:43 AM

## 2018-09-08 ENCOUNTER — Encounter: Payer: Self-pay | Admitting: Pulmonary Disease

## 2018-09-08 NOTE — Patient Instructions (Addendum)
Continue Breo inhaler 100-25, 1 inhalation daily.  Rinse mouth after use. Continue albuterol inhaler as needed for increased shortness of breath, wheezing, cough Follow-up in 6 months with Dr. Patsey Berthold.  Call sooner if needed.

## 2018-09-19 ENCOUNTER — Other Ambulatory Visit: Payer: Self-pay | Admitting: Pulmonary Disease

## 2018-09-19 DIAGNOSIS — M48061 Spinal stenosis, lumbar region without neurogenic claudication: Secondary | ICD-10-CM | POA: Diagnosis not present

## 2018-09-19 DIAGNOSIS — M4316 Spondylolisthesis, lumbar region: Secondary | ICD-10-CM | POA: Diagnosis not present

## 2018-09-19 DIAGNOSIS — M549 Dorsalgia, unspecified: Secondary | ICD-10-CM | POA: Diagnosis not present

## 2018-09-19 MED ORDER — ALBUTEROL SULFATE HFA 108 (90 BASE) MCG/ACT IN AERS: INHALATION_SPRAY | RESPIRATORY_TRACT | 2 refills | Status: DC

## 2018-10-03 ENCOUNTER — Ambulatory Visit: Payer: Medicare HMO | Admitting: Pulmonary Disease

## 2018-10-03 DIAGNOSIS — Z6835 Body mass index (BMI) 35.0-35.9, adult: Secondary | ICD-10-CM | POA: Diagnosis not present

## 2018-10-03 DIAGNOSIS — M549 Dorsalgia, unspecified: Secondary | ICD-10-CM | POA: Diagnosis not present

## 2018-10-03 DIAGNOSIS — E261 Secondary hyperaldosteronism: Secondary | ICD-10-CM | POA: Diagnosis not present

## 2018-10-03 DIAGNOSIS — I509 Heart failure, unspecified: Secondary | ICD-10-CM | POA: Diagnosis not present

## 2018-10-03 DIAGNOSIS — J449 Chronic obstructive pulmonary disease, unspecified: Secondary | ICD-10-CM | POA: Diagnosis not present

## 2018-10-03 DIAGNOSIS — E119 Type 2 diabetes mellitus without complications: Secondary | ICD-10-CM | POA: Diagnosis not present

## 2018-10-03 DIAGNOSIS — I25118 Atherosclerotic heart disease of native coronary artery with other forms of angina pectoris: Secondary | ICD-10-CM | POA: Diagnosis not present

## 2018-10-03 DIAGNOSIS — D692 Other nonthrombocytopenic purpura: Secondary | ICD-10-CM | POA: Diagnosis not present

## 2018-10-03 DIAGNOSIS — F1193 Opioid use, unspecified with withdrawal: Secondary | ICD-10-CM | POA: Diagnosis not present

## 2018-10-07 ENCOUNTER — Telehealth: Payer: Self-pay | Admitting: Pulmonary Disease

## 2018-10-07 NOTE — Telephone Encounter (Signed)

## 2018-10-10 ENCOUNTER — Encounter: Payer: Self-pay | Admitting: Pulmonary Disease

## 2018-10-10 ENCOUNTER — Ambulatory Visit: Payer: Medicare HMO | Admitting: Pulmonary Disease

## 2018-10-10 ENCOUNTER — Other Ambulatory Visit: Payer: Self-pay

## 2018-10-10 VITALS — BP 150/82 | HR 91 | Temp 98.8°F | Ht 63.0 in | Wt 207.8 lb

## 2018-10-10 DIAGNOSIS — R05 Cough: Secondary | ICD-10-CM

## 2018-10-10 DIAGNOSIS — R053 Chronic cough: Secondary | ICD-10-CM

## 2018-10-10 DIAGNOSIS — J8410 Pulmonary fibrosis, unspecified: Secondary | ICD-10-CM

## 2018-10-10 DIAGNOSIS — J449 Chronic obstructive pulmonary disease, unspecified: Secondary | ICD-10-CM

## 2018-10-10 MED ORDER — BREO ELLIPTA 200-25 MCG/INH IN AEPB: 1.0000 | INHALATION_SPRAY | Freq: Every day | RESPIRATORY_TRACT | 0 refills | Status: AC

## 2018-10-10 MED ORDER — PREDNISONE 20 MG PO TABS: 40.0000 mg | ORAL_TABLET | Freq: Every day | ORAL | 0 refills | Status: AC

## 2018-10-10 MED ORDER — DOXYCYCLINE HYCLATE 100 MG PO TABS: 100.0000 mg | ORAL_TABLET | Freq: Two times a day (BID) | ORAL | 0 refills | Status: DC

## 2018-10-10 MED ORDER — BREO ELLIPTA 200-25 MCG/INH IN AEPB: 1.0000 | INHALATION_SPRAY | Freq: Every day | RESPIRATORY_TRACT | 5 refills | Status: DC

## 2018-10-10 NOTE — Patient Instructions (Addendum)
Prednisone 40 mg (2 tabs 20 mg) daily for 5 days.  Prescription entered  doxycycline 100 mg twice a day for 5 days.  Prescription entered Change Breo to 200-25 strength, 1 inhalation daily.  Rinse mouth after use.  Sample provided.  Prescription changed Continue Flonase nasal inhaler daily Continue omeprazole (Prilosec) daily Follow up Sept 01, 2020 for quick recheck

## 2018-10-10 NOTE — Progress Notes (Signed)
PULMONARY OFFICE FOLLOW-UP NOTE  Requesting MD/Service: Self. Primary MD:  Date of initial consultation: 09/02/17 Reason for consultation: DOE  PT PROFILE: 73 y.o. female never smoker previously seen by Dr Raul Del for dyspnea. She reported that she has been given a diagnosis of pulmonary fibrosis and seeks a second opinion.   DATA: 07/08/17 PFTs: FVC 2.61 L, 98% predicted.  FEV1 2.17 L, 106% predicted.  FEV1 ratio 83%.  TLC 77% predicted.  DLCO 71% predicted.  DLCO/VA 125% predicted 08/03/17 HRCT chest: Very mild nonspecific interstitial changes with basilar predominance.  Multiple tiny pulmonary nodules scattered throughout the lungs bilaterally measuring 5 mm or less in size, nonspecific but statistically likely benign.  01/04/18 PFTs: FVC: 2.56 > 2.63 L (97 > 99 %pred), FEV1: 2.08 > 2.13 L (102 > 105 %pred), FEV1/FVC: 81%, TLC: 4.29 L (87 %pred), DLCO 87 %pred    INTERVAL: Last visit 09/07/18 which was a virtual (telephone) encounter due to the coronavirus pandemic.      Subjective:  She requested this follow-up early due to severe persistent cough.  She is accompanied by her daughter who provides much of the following history.  Patient has severe chronic cough which is "rattling" and occasionally productive of scant mucus.  She has had no hemoptysis.  Cough is exacerbated by supine position.  She has minimal shortness of breath (when not coughing).  She denies fever, purulent sputum, chest pain, hemoptysis.  She has minimal exposures, staying indoors most of the time due to the current pandemic.  Her daughter notes that initially "Breo worked well in the beginning".  However, patient remains on Breo without control of her symptoms.   Vitals:   10/10/18 0834  BP: (!) 150/82  Pulse: 91  Temp: 98.8 F (37.1 C)  TempSrc: Oral  SpO2: 94%  Weight: 207 lb 12.8 oz (94.3 kg)  Height: 5\' 3"  (1.6 m)  RA  EXAM:  Gen: NAD at rest, occasional raspy cough HEENT: NCAT, sclerae white, mild  rhinitis bilaterally, oropharynx normal Neck: No JVD Lungs: breath sounds slightly coarse without wheezes or other adventitious sounds Cardiovascular: RRR, no murmurs Abdomen: Soft, nontender, normal BS Ext: without clubbing, cyanosis, edema Neuro: grossly intact Skin: Limited exam, no lesions noted    DATA:   BMP Latest Ref Rng & Units 08/19/2016 05/14/2015 07/02/2014  Glucose 65 - 99 mg/dL - 110(H) 130(H)  BUN mg/dL - - 22(H)  Creatinine 0.44 - 1.00 mg/dL 0.70 - 0.52  Sodium 135 - 145 mmol/L - 141 140  Potassium 3.5 - 5.1 mmol/L - 4.2 4.3  Chloride mmol/L - - 103  CO2 mmol/L - - 29  Calcium mg/dL - - 9.4    CBC Latest Ref Rng & Units 05/14/2015 08/24/2014 07/31/2011  WBC 3.6 - 11.0 K/uL - 6.3 -  Hemoglobin 12.0 - 15.0 g/dL 13.6 13.2 12.3  Hematocrit 36.0 - 46.0 % 40.0 40.1 -  Platelets 150 - 440 K/uL - 251 -    CXR: No new film  I have personally reviewed all chest radiographs reported above including CXRs and CT chest unless otherwise indicated  IMPRESSION:     ICD-10-CM   1. Chronic cough  R05   2. Chronic asthmatic bronchitis ([place])  J44.9   3. Very mild pulmonary fibrosis (HCC)  J84.10    I doubt that the cough is due to the minimal pulmonary fibrosis noted on prior CT scan of the chest.  Her symptoms are suggestive of an airways issue which I have labeled as  chronic asthmatic bronchitis.  I have no explanation for why her symptoms are not adequately controlled on Breo.  She is already on a nasal steroid and PPI  PLAN:  Prednisone 40 mg (2 tabs 20 mg) daily for 5 days.  Prescription entered  doxycycline 100 mg twice a day for 5 days.  Prescription entered Change Breo to 200-25 strength, 1 inhalation daily.  Rinse mouth after use.  Sample provided.  Prescription changed Continue Flonase nasal inhaler daily Continue omeprazole (Prilosec) daily Follow up Sept 01, 2020 for quick recheck   [name], MD PCCM service Mobile [phone] Pager [phone] 10/11/2018  4:27 PM

## 2018-10-18 ENCOUNTER — Other Ambulatory Visit: Payer: Self-pay | Admitting: Pulmonary Disease

## 2018-10-18 MED ORDER — OMEPRAZOLE 40 MG PO CPDR: 40.0000 mg | DELAYED_RELEASE_CAPSULE | Freq: Every evening | ORAL | 3 refills | Status: DC

## 2018-10-18 NOTE — Telephone Encounter (Signed)
Refill sent to pharmacy.   

## 2018-11-03 DIAGNOSIS — E119 Type 2 diabetes mellitus without complications: Secondary | ICD-10-CM | POA: Diagnosis not present

## 2018-11-03 DIAGNOSIS — I10 Essential (primary) hypertension: Secondary | ICD-10-CM | POA: Diagnosis not present

## 2018-11-08 ENCOUNTER — Ambulatory Visit
Admission: RE | Admit: 2018-11-08 | Discharge: 2018-11-08 | Disposition: A | Discharge status: Home / Self Care | Payer: Medicare HMO | Source: Ambulatory Visit | Attending: Pulmonary Disease | Admitting: Pulmonary Disease

## 2018-11-08 ENCOUNTER — Encounter: Payer: Self-pay | Admitting: Pulmonary Disease

## 2018-11-08 ENCOUNTER — Ambulatory Visit: Payer: Medicare HMO | Admitting: Pulmonary Disease

## 2018-11-08 ENCOUNTER — Other Ambulatory Visit: Payer: Self-pay

## 2018-11-08 ENCOUNTER — Other Ambulatory Visit
Admission: RE | Admit: 2018-11-08 | Discharge: 2018-11-08 | Disposition: A | Discharge status: Home / Self Care | Payer: Medicare HMO | Source: Ambulatory Visit | Attending: Pulmonary Disease | Admitting: Pulmonary Disease

## 2018-11-08 VITALS — BP 140/82 | HR 78 | Temp 97.9°F | Ht 63.0 in | Wt 209.6 lb

## 2018-11-08 DIAGNOSIS — R05 Cough: Secondary | ICD-10-CM

## 2018-11-08 DIAGNOSIS — J449 Chronic obstructive pulmonary disease, unspecified: Secondary | ICD-10-CM | POA: Diagnosis not present

## 2018-11-08 DIAGNOSIS — R053 Chronic cough: Secondary | ICD-10-CM

## 2018-11-08 DIAGNOSIS — R0602 Shortness of breath: Secondary | ICD-10-CM | POA: Diagnosis not present

## 2018-11-08 DIAGNOSIS — J8410 Pulmonary fibrosis, unspecified: Secondary | ICD-10-CM | POA: Diagnosis not present

## 2018-11-08 DIAGNOSIS — R21 Rash and other nonspecific skin eruption: Secondary | ICD-10-CM | POA: Diagnosis not present

## 2018-11-08 DIAGNOSIS — J4489 Other specified chronic obstructive pulmonary disease: Secondary | ICD-10-CM

## 2018-11-08 LAB — CBC WITH DIFFERENTIAL/PLATELET
Abs Immature Granulocytes: 0.02 10*3/uL (ref 0.00–0.07)
Basophils Absolute: 0.1 10*3/uL (ref 0.0–0.1)
Basophils Relative: 1 %
Eosinophils Absolute: 0.3 10*3/uL (ref 0.0–0.5)
Eosinophils Relative: 7 %
HCT: 38.1 % (ref 36.0–46.0)
Hemoglobin: 12.7 g/dL (ref 12.0–15.0)
Immature Granulocytes: 0 %
Lymphocytes Relative: 29 %
Lymphs Abs: 1.5 10*3/uL (ref 0.7–4.0)
MCH: 30 pg (ref 26.0–34.0)
MCHC: 33.3 g/dL (ref 30.0–36.0)
MCV: 90.1 fL (ref 80.0–100.0)
Monocytes Absolute: 0.5 10*3/uL (ref 0.1–1.0)
Monocytes Relative: 10 %
Neutro Abs: 2.7 10*3/uL (ref 1.7–7.7)
Neutrophils Relative %: 53 %
Platelets: 227 10*3/uL (ref 150–400)
RBC: 4.23 MIL/uL (ref 3.87–5.11)
RDW: 13.3 % (ref 11.5–15.5)
WBC: 5 10*3/uL (ref 4.0–10.5)
nRBC: 0 % (ref 0.0–0.2)

## 2018-11-08 MED ORDER — TRELEGY ELLIPTA 100-62.5-25 MCG/INH IN AEPB: 1.0000 | INHALATION_SPRAY | Freq: Every day | RESPIRATORY_TRACT | 0 refills | Status: AC

## 2018-11-08 MED ORDER — CETIRIZINE HCL 10 MG PO TABS: 10.0000 mg | ORAL_TABLET | Freq: Every day | ORAL | 5 refills | Status: DC

## 2018-11-08 MED ORDER — TRELEGY ELLIPTA 100-62.5-25 MCG/INH IN AEPB: 1.0000 | INHALATION_SPRAY | Freq: Every day | RESPIRATORY_TRACT | 5 refills | Status: DC

## 2018-11-08 MED ORDER — PREDNISONE 20 MG PO TABS: 40.0000 mg | ORAL_TABLET | Freq: Every day | ORAL | 0 refills | Status: AC

## 2018-11-08 NOTE — Addendum Note (Signed)
Addended by: Santiago Bur on: 11/08/2018 09:50 AM   Modules accepted: Orders

## 2018-11-08 NOTE — Progress Notes (Signed)
PULMONARY OFFICE FOLLOW-UP NOTE  Requesting MD/Service: Self. Primary MD:  Date of initial consultation: 09/02/17 Reason for consultation: DOE  PT PROFILE: 73 y.o. female never smoker previously seen by Dr Raul Del for dyspnea. She reported that she has been given a diagnosis of pulmonary fibrosis and seeks a second opinion.   DATA: 07/08/17 PFTs: FVC 2.61 L, 98% predicted.  FEV1 2.17 L, 106% predicted.  FEV1 ratio 83%.  TLC 77% predicted.  DLCO 71% predicted.  DLCO/VA 125% predicted 08/03/17 HRCT chest: Very mild nonspecific interstitial changes with basilar predominance.  Multiple tiny pulmonary nodules scattered throughout the lungs bilaterally measuring 5 mm or less in size, nonspecific but statistically likely benign.  01/04/18 PFTs: FVC: 2.56 > 2.63 L (97 > 99 %pred), FEV1: 2.08 > 2.13 L (102 > 105 %pred), FEV1/FVC: 81%, TLC: 4.29 L (87 %pred), DLCO 87 %pred    INTERVAL: Last visit 10/10/18. Treated with course of prednisone and doxycycline for dx of acute on chronic asthmatic bronchitis.    Subjective:  This is a scheduled follow up. She initially improved with prednisone and doxycycline but her symptoms relapsed and a few days after completion.  She again has daily cough triggered by "allergies", weather changes.  She also had an episode of vigorous coughing while at church recently.  She notes exertional dyspnea with shortness of breath when ambulating inclines.  Her cough is productive of clear mucus, sometimes discolored.  She has no fever, hemoptysis, chest pain, lower extremity edema, calf tenderness.  She does report a eczematous rash on her LUE and trunk.   Vitals:   11/08/18 0835  BP: 140/82  Pulse: 78  Temp: 97.9 F (36.6 C)  TempSrc: Temporal  SpO2: 95%  Weight: 209 lb 9.6 oz (95.1 kg)  Height: 5\' 3"  (1.6 m)  RA  EXAM:  Gen: NAD, occasional coughing during this encounter HEENT: NCAT, sclerae white Neck: No JVD noted Lungs: breath sounds full and slightly coarse  without wheezes Cardiovascular: RRR, no murmurs Abdomen: Soft, nontender, normal BS Ext: without clubbing, cyanosis, edema.  Eczematous rash on LUE and chest Neuro: grossly intact Skin: Limited exam, no lesions noted   DATA:   BMP Latest Ref Rng & Units 08/19/2016 05/14/2015 07/02/2014  Glucose 65 - 99 mg/dL - 110(H) 130(H)  BUN mg/dL - - 22(H)  Creatinine 0.44 - 1.00 mg/dL 0.70 - 0.52  Sodium 135 - 145 mmol/L - 141 140  Potassium 3.5 - 5.1 mmol/L - 4.2 4.3  Chloride mmol/L - - 103  CO2 mmol/L - - 29  Calcium mg/dL - - 9.4    CBC Latest Ref Rng & Units 05/14/2015 08/24/2014 07/31/2011  WBC 3.6 - 11.0 K/uL - 6.3 -  Hemoglobin 12.0 - 15.0 g/dL 13.6 13.2 12.3  Hematocrit 36.0 - 46.0 % 40.0 40.1 -  Platelets 150 - 440 K/uL - 251 -    CXR: Pending  I have personally reviewed all chest radiographs reported above including CXRs and CT chest unless otherwise indicated  IMPRESSION:     ICD-10-CM   1. Chronic cough  R05 CBC with Differential/Platelet    Perennial allergen profile IgE    DG Chest 2 View  2. Chronic asthmatic bronchitis ([place])  J44.9   3. Very mild pulmonary fibrosis (HCC)  J84.10   4. Eczematous rash on LUE and chest  R21    Cough continues to be suggestive of airway inflammation.  Patient questions whether she should be seen by an allergist.  PLAN:  Chest x-ray  ordered for today.  We will contact her with any new findings. Blood test today: CBC with differential, RAST test  Repeat course of prednisone - 40 mg (2 x 20 mg) daily for 5 days Stop Breo inhaler Trelegy inhaler, 1 inhalation daily.  Rinse mouth after use.  Sample provided.  Prescription entered.  Continue Flonase, omeprazole, Zyrtec (cetirizine) as previously prescribed  Recommended diphenhydramine or hydrocortisone cream to areas of rash on LUE  Follow-up in 2-3 weeks with Dr. Patsey Berthold at which time we can consider further evaluation for specific allergies   [name], MD PCCM service Mobile  [phone] Pager [phone] 11/08/2018 9:12 AM

## 2018-11-08 NOTE — Patient Instructions (Addendum)
Chest x-ray ordered for today.  We will contact you with any new findings. Blood test today: CBC with differential, RAST test  Repeat course of prednisone - 40 mg (2 x 20 mg) daily for 5 days Stop Breo inhaler Trelegy inhaler, 1 inhalation daily.  Rinse mouth after use.  Sample provided.  Prescription entered.  Continue Flonase, omeprazole, Zyrtec (cetirizine) as previously  Follow-up in 2-3 weeks with Dr. Patsey Berthold

## 2018-11-10 DIAGNOSIS — R0989 Other specified symptoms and signs involving the circulatory and respiratory systems: Secondary | ICD-10-CM | POA: Diagnosis not present

## 2018-11-10 DIAGNOSIS — E119 Type 2 diabetes mellitus without complications: Secondary | ICD-10-CM | POA: Diagnosis not present

## 2018-11-10 DIAGNOSIS — I25118 Atherosclerotic heart disease of native coronary artery with other forms of angina pectoris: Secondary | ICD-10-CM | POA: Diagnosis not present

## 2018-11-10 DIAGNOSIS — R21 Rash and other nonspecific skin eruption: Secondary | ICD-10-CM | POA: Diagnosis not present

## 2018-11-10 DIAGNOSIS — I10 Essential (primary) hypertension: Secondary | ICD-10-CM | POA: Diagnosis not present

## 2018-11-10 DIAGNOSIS — Z0000 Encounter for general adult medical examination without abnormal findings: Secondary | ICD-10-CM | POA: Diagnosis not present

## 2018-11-10 DIAGNOSIS — L309 Dermatitis, unspecified: Secondary | ICD-10-CM | POA: Diagnosis not present

## 2018-11-10 LAB — MISC LABCORP TEST (SEND OUT): Labcorp test code: 62497

## 2018-11-18 DIAGNOSIS — N3942 Incontinence without sensory awareness: Secondary | ICD-10-CM | POA: Diagnosis not present

## 2018-11-18 DIAGNOSIS — R350 Frequency of micturition: Secondary | ICD-10-CM | POA: Diagnosis not present

## 2018-11-18 DIAGNOSIS — R351 Nocturia: Secondary | ICD-10-CM | POA: Diagnosis not present

## 2018-11-18 DIAGNOSIS — N3946 Mixed incontinence: Secondary | ICD-10-CM | POA: Diagnosis not present

## 2018-11-30 ENCOUNTER — Other Ambulatory Visit: Payer: Self-pay

## 2018-11-30 ENCOUNTER — Ambulatory Visit (INDEPENDENT_AMBULATORY_CARE_PROVIDER_SITE_OTHER): Payer: Medicare HMO | Admitting: Pulmonary Disease

## 2018-11-30 ENCOUNTER — Encounter: Payer: Self-pay | Admitting: Pulmonary Disease

## 2018-11-30 VITALS — BP 128/80 | HR 85 | Temp 97.8°F | Ht 63.5 in | Wt 209.4 lb

## 2018-11-30 DIAGNOSIS — R053 Chronic cough: Secondary | ICD-10-CM

## 2018-11-30 DIAGNOSIS — R05 Cough: Secondary | ICD-10-CM

## 2018-11-30 DIAGNOSIS — Z6836 Body mass index (BMI) 36.0-36.9, adult: Secondary | ICD-10-CM

## 2018-11-30 DIAGNOSIS — E6609 Other obesity due to excess calories: Secondary | ICD-10-CM | POA: Diagnosis not present

## 2018-11-30 DIAGNOSIS — J8410 Pulmonary fibrosis, unspecified: Secondary | ICD-10-CM | POA: Diagnosis not present

## 2018-11-30 DIAGNOSIS — J4540 Moderate persistent asthma, uncomplicated: Secondary | ICD-10-CM

## 2018-11-30 MED ORDER — BREO ELLIPTA 200-25 MCG/INH IN AEPB: 1.0000 | INHALATION_SPRAY | Freq: Every day | RESPIRATORY_TRACT | 11 refills | Status: DC

## 2018-11-30 MED ORDER — BREO ELLIPTA 200-25 MCG/INH IN AEPB: 1.0000 | INHALATION_SPRAY | Freq: Every day | RESPIRATORY_TRACT | 0 refills | Status: DC

## 2018-11-30 MED ORDER — PREDNISONE 10 MG (21) PO TBPK: ORAL_TABLET | ORAL | 0 refills | Status: DC

## 2018-11-30 NOTE — Progress Notes (Signed)
breo

## 2018-11-30 NOTE — Patient Instructions (Addendum)
1.  We will give you prednisone taper pack  2.  We will switch Trelegy to [facility] Ellipta 200/25, 1 inhalation daily  3.  We will see him in follow-up in 4 to 6 weeks time.  Call sooner should any new difficulties arise.  4.  Take N-acetylcysteine (NAC) 600 mg (1 capsule) twice a day with food.  5.  We are withholding your flu shot today because you are very wheezy.

## 2018-11-30 NOTE — Progress Notes (Signed)
Subjective:    Patient ID: Tiffany Alexander, female    DOB: [birth year], 73 y.o.   MRN: [id number]  HPI Patient is a 73 year old lifelong never smoker previously followed here by Dr. [name] for chronic asthmatic bronchitis and mild pulmonary fibrosis.  I am assuming her care after the departure of Dr. [name].  The patient notices that she has bouts of coughing and wheezing which are usually steroid responsive.  She had been doing relatively well on Breo Ellipta 100/25 but was switched to Trelegy Ellipta during her last visit with Dr. [name] on 1 September.  She was also given a prednisone taper, she noticed improvement with the prednisone taper however symptoms recurred after prednisone was completed.  The patient has noticed that she has not had any relief from the Trelegy, if anything her secretions are thicker and more difficult to clear.  The patient had a RAST test and a CBC that was not suggestive of specific allergies.  Having said that, I do support her going to see an allergist as more detailed and specific testing can be done.  With regards to her pulmonary fibrosis, in the past she had been on Macrodantin, given that her fibrosis is nonspecific it is likely that this was the cause.  Macrodantin causes 2 types of interstitial lung disease.  One is self-limiting and the one associated with chronic congestion is usually irreversible.  However, on PFTs she does not have significant impairment in this regard.  Data thus far: 07/08/17 PFTs: FVC 2.61 L, 98% predicted.  FEV1 2.17 L, 106% predicted.  FEV1 ratio 83%.  TLC 77% predicted.  DLCO 71% predicted.  DLCO/VA 125% predicted 08/03/17 HRCT chest: Very mild nonspecific interstitial changes with basilar predominance.  Multiple tiny pulmonary nodules scattered throughout the lungs bilaterally measuring 5 mm or less in size, nonspecific but statistically likely benign.  01/04/18 PFTs: FVC: 2.56 > 2.63 L (97 > 99 %pred), FEV1: 2.08 > 2.13 L  (102 > 105 %pred), FEV1/FVC: 81%, TLC: 4.29 L (87 %pred), DLCO 87 %pred 11/08/2018 [place] allergen profile was negative, total IgE not done.  CBC with differential showed no eosinophilia.   Review of Systems  Constitutional: Positive for fatigue.  HENT: Positive for congestion and postnasal drip.   Eyes: Negative.   Respiratory: Positive for cough, shortness of breath and wheezing.   Cardiovascular: Positive for palpitations.  Gastrointestinal: Negative.        No reflux symptoms  Endocrine: Negative.   Genitourinary: Negative.   Musculoskeletal: Negative.   Skin: Positive for rash (Previously noted rash markedly improved).  Allergic/Immunologic:       Uncertain if she has specific allergies.  Neurological: Negative.   Hematological: Negative.   Psychiatric/Behavioral: Negative.   All other systems reviewed and are negative.      Objective:   Physical Exam Vitals signs and nursing note reviewed.  Constitutional:      Appearance: Normal appearance. She is obese.  HENT:     Head: Normocephalic and atraumatic.     Right Ear: External ear normal.     Left Ear: External ear normal.     Nose:     Comments: Nose/mouth/throat not examined due to masking requirements for COVID 19. Eyes:     General: No scleral icterus.    Conjunctiva/sclera: Conjunctivae normal.     Pupils: Pupils are equal, round, and reactive to light.  Neck:     Musculoskeletal: Neck supple.     Thyroid: No thyromegaly.  Trachea: Trachea and phonation normal.  Cardiovascular:     Rate and Rhythm: Normal rate and regular rhythm.     Pulses: Normal pulses.     Heart sounds: Normal heart sounds. No murmur.  Pulmonary:     Effort: Pulmonary effort is normal.     Breath sounds: Wheezing (Diffuse, musical) and rhonchi (Scattered) present. No decreased breath sounds or rales.  Abdominal:     General: Abdomen is protuberant. There is no distension.  Skin:    General: Skin is warm and dry.  Neurological:      General: No focal deficit present.     Mental Status: She is alert and oriented to person, place, and time.  Psychiatric:        Mood and Affect: Mood normal.        Behavior: Behavior normal.           Assessment & Plan:   1.  Chronic asthmatic bronchitis/moderate persistent asthma: The patient really shows steroid response.  She has not done well with Trelegy Ellipta, we will switch to [facility] but increase the steroid dose to 200 mcg per dose.  This will be 1 inhalation daily, rinse well after use.  She can use albuterol as needed in between times up to 4 times a day.  She will receive a steroid taper pack today.  We will see her in follow-up in 4 to 6 weeks time she is to contact us sooner should any new difficulties arise.  2.  Chronic cough: Likely due to poorly controlled asthma.  Breo Ellipta as above.  I have also recommended that she start N-acetylcysteine (NAC) 600 mg 1 capsule twice a day with food.  This is an anti-inflammatory mucolytic that will help also with her issues of mild pulmonary fibrosis.  3.  Very mild pulmonary fibrosis: Likely this was due to past exposure to nitrofurantoin.  It does not appear to be progressive.  N-acetylcysteine should help with this as well.  4.  Need for flu vaccine: Unfortunately due to her significant bronchospasm today, need for steroids and poorly compensated status we will have to withhold flu vaccine until her follow-up visit.  Alternatively she can get this at her primary care physician's office once she is off steroids.  5.  Obesity BMI of 36.5, currently appears to be without serious comorbidity: Weight loss is recommended.

## 2018-12-17 ENCOUNTER — Other Ambulatory Visit: Payer: Self-pay | Admitting: Pulmonary Disease

## 2018-12-28 ENCOUNTER — Ambulatory Visit: Payer: Medicare HMO | Admitting: Pulmonary Disease

## 2019-01-05 DIAGNOSIS — J4530 Mild persistent asthma, uncomplicated: Secondary | ICD-10-CM | POA: Diagnosis not present

## 2019-01-05 DIAGNOSIS — K219 Gastro-esophageal reflux disease without esophagitis: Secondary | ICD-10-CM | POA: Diagnosis not present

## 2019-01-05 DIAGNOSIS — J300 Vasomotor rhinitis: Secondary | ICD-10-CM | POA: Diagnosis not present

## 2019-01-05 DIAGNOSIS — J309 Allergic rhinitis, unspecified: Secondary | ICD-10-CM | POA: Diagnosis not present

## 2019-01-11 DIAGNOSIS — N3946 Mixed incontinence: Secondary | ICD-10-CM | POA: Diagnosis not present

## 2019-01-12 ENCOUNTER — Encounter: Payer: Self-pay | Admitting: Pulmonary Disease

## 2019-01-12 ENCOUNTER — Other Ambulatory Visit: Payer: Self-pay

## 2019-01-12 ENCOUNTER — Ambulatory Visit: Payer: Medicare HMO | Admitting: Pulmonary Disease

## 2019-01-12 VITALS — BP 134/70 | HR 72 | Temp 97.9°F | Ht 63.5 in | Wt 204.8 lb

## 2019-01-12 DIAGNOSIS — J8410 Pulmonary fibrosis, unspecified: Secondary | ICD-10-CM

## 2019-01-12 DIAGNOSIS — R05 Cough: Secondary | ICD-10-CM

## 2019-01-12 DIAGNOSIS — E6609 Other obesity due to excess calories: Secondary | ICD-10-CM | POA: Diagnosis not present

## 2019-01-12 DIAGNOSIS — Z6836 Body mass index (BMI) 36.0-36.9, adult: Secondary | ICD-10-CM | POA: Diagnosis not present

## 2019-01-12 DIAGNOSIS — J4540 Moderate persistent asthma, uncomplicated: Secondary | ICD-10-CM | POA: Diagnosis not present

## 2019-01-12 DIAGNOSIS — R053 Chronic cough: Secondary | ICD-10-CM

## 2019-01-12 MED ORDER — ARFORMOTEROL TARTRATE 15 MCG/2ML IN NEBU: 15.0000 ug | INHALATION_SOLUTION | Freq: Two times a day (BID) | RESPIRATORY_TRACT | 6 refills | Status: DC

## 2019-01-12 MED ORDER — BUDESONIDE 0.25 MG/2ML IN SUSP: 0.2500 mg | Freq: Two times a day (BID) | RESPIRATORY_TRACT | 5 refills | Status: DC

## 2019-01-12 MED ORDER — IPRATROPIUM-ALBUTEROL 0.5-2.5 (3) MG/3ML IN SOLN: 3.0000 mL | Freq: Four times a day (QID) | RESPIRATORY_TRACT | 2 refills | Status: DC | PRN

## 2019-01-12 NOTE — Patient Instructions (Signed)
1.  We will switch her Breo Ellipta to Brovana and Pulmicort (budesonide).  Use Brovana and the nebulizer twice a day followed by budesonide in the nebulizer twice a day.  Do not mix them together as they do tend to precipitate some.  2.  You will be provided with ipratropium-albuterol combination (DuoNeb) which you can use in the nebulizer 3-4 times a day as needed for cough, wheezing and congestion.  3.  We will schedule breathing tests.  4.  We will see you in follow-up in 4 to 6 weeks.  Call sooner should any new difficulties arise.

## 2019-01-12 NOTE — Progress Notes (Signed)
Subjective:    Patient ID: Tiffany Alexander, female    DOB: June 25, 1945, 73 y.o.   MRN: [id number]  HPI Patient is a 73 year old lifelong never smoker, I first evaluated on 30 November 2018.  Previously she followed with Tiffany Alexander.  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  She has had difficulties with a prolonged asthmatic exacerbation.  She was switched to Trelegy Ellipta by Tiffany Alexander prior to his departure this actually made her symptoms worse due to making her secretions very thick.  At her initial evaluation with me she was switched to [facility] 200/25 and though she notes some improvement with that she is having difficulties still with thick secretions.  She also has difficulties with the price of the medication.  She did see an allergist and does not appear she has significant allergies.  She is currently using N-acetylcysteine and feels that this helps somewhat with her secretions.  She has not had any orthopnea or paroxysmal nocturnal dyspnea.  No chest pain, sputum is white and thick.  No hemoptysis.  She voices no other complaint.  Data thus far: 07/08/17 PFTs:FVC 2.61 L, 98% predicted. FEV1 2.17 L, 106% predicted. FEV1 ratio 83%. TLC 77% predicted. DLCO 71% predicted. DLCO/VA 125% predicted 08/03/17 HRCT chest:Very mild nonspecific interstitial changes with basilar predominance. Multiple tiny pulmonary nodules scattered throughout the lungs bilaterally measuring 5 mm or less in size, nonspecific but statistically likely benign.  01/04/18 PFTs:FVC: 2.56 >2.63 L (97 >99 %pred), FEV1: 2.08 >2.13 L (102 >105 %pred), FEV1/FVC: 81%, TLC: 4.29 L (87 %pred), DLCO 87 %pred 11/08/2018 Eastern allergen profile was negative, total IgE not done.  CBC with differential showed no eosinophilia.   Review of Systems A 10 point review of systems was performed and it is as noted above otherwise negative.     Objective:   Physical Exam Vitals signs and nursing note reviewed.  Constitutional:      Appearance: Normal appearance. She is obese.  HENT:     Head: Normocephalic and atraumatic.     Right Ear: External ear normal.     Left Ear: External ear normal.     Nose:     Comments: Nose/mouth/throat not examined due to masking requirements for COVID 19. Eyes:     General: No scleral icterus.    Conjunctiva/sclera: Conjunctivae normal.     Pupils: Pupils are equal, round, and reactive to light.  Neck:     Musculoskeletal: Neck supple.     Thyroid: No thyromegaly.     Trachea: Trachea and phonation normal.  Cardiovascular:     Rate and Rhythm: Normal rate and regular rhythm.     Pulses: Normal pulses.     Heart sounds: Normal heart sounds. No murmur.  Pulmonary:     Effort: Pulmonary effort is normal.     Breath sounds: Wheezing (Diffuse, musical) and rhonchi (Scattered) present. No decreased breath sounds or rales.  Abdominal:     General: Abdomen is protuberant. There is no distension.  Skin:    General: Skin is warm and dry.  Neurological:     General: No focal deficit present.     Mental Status: She is alert and oriented to person, place, and time.  Psychiatric:        Mood and Affect: Mood normal.        Behavior: Behavior normal.    BP 134/70 (BP Location: Left Arm, Cuff Size: Normal)  Pulse 72   Temp 97.9 F (36.6 C) (Temporal)   Ht 5' 3.5" (1.613 m)   Wt 204 lb 12.8 oz (92.9 kg)   SpO2 95%   BMI 35.71 kg/m         Assessment & Plan:   1.  Chronic asthmatic bronchitis/moderate persistent asthma: The patient really shows steroid response.  She has not done well with Trelegy Ellipta, she was switched to [facility] Ellipta 200/25 with persistent symptoms.    I believe at this point will do better with nebulized medications as this will also help with ciliary clearance.  We will switch her to Brovana 18 mcg via nebulizer twice a day and Pulmicort 0.25 mg via nebulizer  twice a day via nebulizer she will also get DuoNeb which she can use as needed for rescue up to 8 times a day.  She has not had PFTs in well over a year so these will be ordered.  We will see her in follow-up in 4 to 6 weeks time she is to contact us prior to that time should any new difficulties arise.  2.  Chronic cough: Likely due to poorly controlled asthma.  Nebulization treatments should help with this as above.  Continue N-acetylcysteine (NAC) 600 mg 1 capsule twice a day with food.  She has noticed that this helps.  3.  Very mild pulmonary fibrosis: Likely this was due to past exposure to nitrofurantoin.  It does not appear to be progressive.  N-acetylcysteine should help with this as well.  4.  Need for flu vaccine: Due to her ongoing bronchospasm will defer until her follow-up visit.  5.  Obesity BMI of 36.5, currently appears to be without serious comorbidity: Weight loss is recommended.   Tiffany Don, MD [facility]    This note was dictated using voice recognition software/Dragon.  Despite best efforts to proofread, errors can occur which can change the meaning.  Any change was purely unintentional.

## 2019-01-23 ENCOUNTER — Encounter: Payer: Self-pay | Admitting: Pulmonary Disease

## 2019-01-23 ENCOUNTER — Telehealth: Payer: Self-pay | Admitting: Pulmonary Disease

## 2019-01-23 NOTE — Telephone Encounter (Signed)
Pt has not received nebulizer machine.  Order was placed to [facility] on 01/12/2019.  Rhonda, please advise. Thanks

## 2019-01-24 NOTE — Telephone Encounter (Signed)
Spoke with patient and pt did not want her medication from [facility],she stated that Rx for nebulizer medication was already at pharmacy.  Pt wanted to know when nebulizer was going to be delivered. Called and spoke with [facility] who advised that Nebulizer was to be delivered [facility] 01/25/2019. Advised patient of this and she will go and p/u her medication from the pharmacy today. Pt is aware and nothing else needed at this time. Rhonda J Cobb

## 2019-01-25 DIAGNOSIS — J8410 Pulmonary fibrosis, unspecified: Secondary | ICD-10-CM | POA: Diagnosis not present

## 2019-01-26 ENCOUNTER — Telehealth: Payer: Self-pay | Admitting: Pulmonary Disease

## 2019-01-26 NOTE — Telephone Encounter (Signed)
Spoke to pt, who is requesting [facility]'s contact number to reach out to them regarding her nebulized medications.  Pt has been provided with [facility]'s contact number. Nothing further is needed.

## 2019-01-26 NOTE — Telephone Encounter (Signed)
ATC pt- line rang busy. Will call back.

## 2019-01-30 ENCOUNTER — Telehealth: Payer: Self-pay | Admitting: Pulmonary Disease

## 2019-01-30 NOTE — Telephone Encounter (Addendum)
Spoke to pt, who stated that Garlon Hatchet is not covered by insurance.  I have spoken to [place], who stated that insurance prefers perforomist.   LG please advise. Thanks

## 2019-01-31 DIAGNOSIS — N3946 Mixed incontinence: Secondary | ICD-10-CM | POA: Diagnosis not present

## 2019-01-31 DIAGNOSIS — N3942 Incontinence without sensory awareness: Secondary | ICD-10-CM | POA: Diagnosis not present

## 2019-02-01 MED ORDER — PERFOROMIST 20 MCG/2ML IN NEBU: 20.0000 ug | INHALATION_SOLUTION | Freq: Two times a day (BID) | RESPIRATORY_TRACT | 11 refills | Status: DC

## 2019-02-01 NOTE — Addendum Note (Signed)
Addended by: Maryanna Shape A on: 02/01/2019 10:30 AM   Modules accepted: Orders

## 2019-02-01 NOTE — Telephone Encounter (Addendum)
Rx for Perforomist has been printed and given to [place] to fax to [place].  Pt's spouse, Stephen(dpr) is aware and voiced his understanding.  Nothing further is needed at this time.

## 2019-02-01 NOTE — Telephone Encounter (Signed)
Perforomist is okay to use.

## 2019-02-06 ENCOUNTER — Telehealth: Payer: Self-pay | Admitting: Pulmonary Disease

## 2019-02-06 NOTE — Telephone Encounter (Signed)
Called and spoke to pt's daughter, Joanie(DPR). [place] stated that per [employer], brovana is not covered by insurance. I have made Joanie aware that I spoke with pt regarding this on 01/30/2019 and that brovana was switched to perforomist. [place] stated that per pt perforomist is not covered either and will need PA.  I have spoken to Romie Minus with [employer], who stated that PA is in fact needed.  [place] at [phone] and started PA for perforomist. Determination will be received via fax within 24-48hr.   PA # [id number]

## 2019-02-06 NOTE — Telephone Encounter (Signed)
Pts daughter called stating that her medications that Dr. Patsey Berthold called in her Pulmicort needs a PA done before she can get the medication. Or something else needs to be called in. Patient is getting frustrated because she is needing the medication ASAP. Please call patients daughter back at [phone].

## 2019-02-08 MED ORDER — PERFOROMIST 20 MCG/2ML IN NEBU: 20.0000 ug | INHALATION_SOLUTION | Freq: Two times a day (BID) | RESPIRATORY_TRACT | 11 refills | Status: DC

## 2019-02-08 NOTE — Telephone Encounter (Signed)
Received approval from [facility] for [place] to Romie Minus with [employer] and made her aware of approval, however medication needs to filed under part B. N[place] stated that [facility] is not able to file under part B, and that medication will need to be sent to another pharmacy. Made pt's daughter, Kisamore aware of this information. Joanie requested that I contact [facility] to see if they carry this medication. Spoke to [facility] and was advised that they do not carry perforomist.  Made Joanie aware of this information and advised her that I would attempt to send this medication to [facility].

## 2019-02-13 ENCOUNTER — Telehealth: Payer: Self-pay | Admitting: Pulmonary Disease

## 2019-02-13 NOTE — Telephone Encounter (Signed)
Spoke to pt's daughter, [place] ([place]), who stated that Rx for perforomist will be available for pickup today. Will close encounter, as nothing further is needed.

## 2019-02-13 NOTE — Telephone Encounter (Signed)
Please see 02/06/2019 phone note.  Pt was able to get Rx for perforomist from local pharmacy under medicare part B.  Verdis Frederickson with [employer] is aware and voiced her understanding. Nothing further is needed.

## 2019-02-17 ENCOUNTER — Ambulatory Visit: Payer: Medicare HMO | Admitting: Pulmonary Disease

## 2019-02-23 ENCOUNTER — Other Ambulatory Visit: Payer: Self-pay | Admitting: Pulmonary Disease

## 2019-02-24 DIAGNOSIS — J8410 Pulmonary fibrosis, unspecified: Secondary | ICD-10-CM | POA: Diagnosis not present

## 2019-03-08 ENCOUNTER — Telehealth: Payer: Self-pay | Admitting: Pulmonary Disease

## 2019-03-08 NOTE — Telephone Encounter (Signed)
Pt is aware of date/time of covid test.   

## 2019-03-09 DIAGNOSIS — Z7984 Long term (current) use of oral hypoglycemic drugs: Secondary | ICD-10-CM | POA: Diagnosis not present

## 2019-03-09 DIAGNOSIS — E119 Type 2 diabetes mellitus without complications: Secondary | ICD-10-CM | POA: Diagnosis not present

## 2019-03-13 ENCOUNTER — Other Ambulatory Visit: Payer: Medicare HMO

## 2019-03-14 ENCOUNTER — Ambulatory Visit: Payer: Medicare HMO

## 2019-03-14 DIAGNOSIS — G8929 Other chronic pain: Secondary | ICD-10-CM | POA: Diagnosis not present

## 2019-03-14 DIAGNOSIS — M25512 Pain in left shoulder: Secondary | ICD-10-CM | POA: Diagnosis not present

## 2019-03-16 DIAGNOSIS — N3942 Incontinence without sensory awareness: Secondary | ICD-10-CM | POA: Diagnosis not present

## 2019-03-16 DIAGNOSIS — N3946 Mixed incontinence: Secondary | ICD-10-CM | POA: Diagnosis not present

## 2019-03-19 ENCOUNTER — Other Ambulatory Visit: Payer: Self-pay | Admitting: Pulmonary Disease

## 2019-03-20 ENCOUNTER — Telehealth: Payer: Self-pay | Admitting: Pulmonary Disease

## 2019-03-20 NOTE — Telephone Encounter (Signed)
Noted.   Will route to LG as an FYI.

## 2019-03-20 NOTE — Telephone Encounter (Signed)
Noted  

## 2019-03-22 ENCOUNTER — Ambulatory Visit: Payer: Medicare HMO | Admitting: Pulmonary Disease

## 2019-03-27 DIAGNOSIS — J8410 Pulmonary fibrosis, unspecified: Secondary | ICD-10-CM | POA: Diagnosis not present

## 2019-04-05 DIAGNOSIS — N3946 Mixed incontinence: Secondary | ICD-10-CM | POA: Diagnosis not present

## 2019-04-05 DIAGNOSIS — N3942 Incontinence without sensory awareness: Secondary | ICD-10-CM | POA: Diagnosis not present

## 2019-04-05 DIAGNOSIS — J8410 Pulmonary fibrosis, unspecified: Secondary | ICD-10-CM | POA: Diagnosis not present

## 2019-04-27 DIAGNOSIS — J8410 Pulmonary fibrosis, unspecified: Secondary | ICD-10-CM | POA: Diagnosis not present

## 2019-05-03 DIAGNOSIS — N390 Urinary tract infection, site not specified: Secondary | ICD-10-CM | POA: Diagnosis not present

## 2019-05-03 DIAGNOSIS — E119 Type 2 diabetes mellitus without complications: Secondary | ICD-10-CM | POA: Diagnosis not present

## 2019-05-10 DIAGNOSIS — E782 Mixed hyperlipidemia: Secondary | ICD-10-CM | POA: Diagnosis not present

## 2019-05-10 DIAGNOSIS — I10 Essential (primary) hypertension: Secondary | ICD-10-CM | POA: Diagnosis not present

## 2019-05-10 DIAGNOSIS — M25512 Pain in left shoulder: Secondary | ICD-10-CM | POA: Diagnosis not present

## 2019-05-10 DIAGNOSIS — E119 Type 2 diabetes mellitus without complications: Secondary | ICD-10-CM | POA: Diagnosis not present

## 2019-05-10 DIAGNOSIS — J8410 Pulmonary fibrosis, unspecified: Secondary | ICD-10-CM | POA: Diagnosis not present

## 2019-05-10 DIAGNOSIS — I25118 Atherosclerotic heart disease of native coronary artery with other forms of angina pectoris: Secondary | ICD-10-CM | POA: Diagnosis not present

## 2019-05-10 DIAGNOSIS — J449 Chronic obstructive pulmonary disease, unspecified: Secondary | ICD-10-CM | POA: Diagnosis not present

## 2019-05-10 DIAGNOSIS — G8929 Other chronic pain: Secondary | ICD-10-CM | POA: Diagnosis not present

## 2019-05-16 DIAGNOSIS — E261 Secondary hyperaldosteronism: Secondary | ICD-10-CM | POA: Diagnosis not present

## 2019-05-16 DIAGNOSIS — J449 Chronic obstructive pulmonary disease, unspecified: Secondary | ICD-10-CM | POA: Diagnosis not present

## 2019-05-16 DIAGNOSIS — D692 Other nonthrombocytopenic purpura: Secondary | ICD-10-CM | POA: Diagnosis not present

## 2019-05-16 DIAGNOSIS — I25118 Atherosclerotic heart disease of native coronary artery with other forms of angina pectoris: Secondary | ICD-10-CM | POA: Diagnosis not present

## 2019-05-16 DIAGNOSIS — I509 Heart failure, unspecified: Secondary | ICD-10-CM | POA: Diagnosis not present

## 2019-05-16 DIAGNOSIS — Z7984 Long term (current) use of oral hypoglycemic drugs: Secondary | ICD-10-CM | POA: Diagnosis not present

## 2019-05-16 DIAGNOSIS — E119 Type 2 diabetes mellitus without complications: Secondary | ICD-10-CM | POA: Diagnosis not present

## 2019-05-25 DIAGNOSIS — J8410 Pulmonary fibrosis, unspecified: Secondary | ICD-10-CM | POA: Diagnosis not present

## 2019-06-25 DIAGNOSIS — J8410 Pulmonary fibrosis, unspecified: Secondary | ICD-10-CM | POA: Diagnosis not present

## 2019-07-24 ENCOUNTER — Other Ambulatory Visit: Payer: Self-pay | Admitting: Family Medicine

## 2019-07-24 DIAGNOSIS — Z1231 Encounter for screening mammogram for malignant neoplasm of breast: Secondary | ICD-10-CM

## 2019-07-25 DIAGNOSIS — J8410 Pulmonary fibrosis, unspecified: Secondary | ICD-10-CM | POA: Diagnosis not present

## 2019-07-31 ENCOUNTER — Telehealth: Payer: Self-pay | Admitting: Pulmonary Disease

## 2019-07-31 NOTE — Telephone Encounter (Signed)
PFT scheduled for 08/24/2019 at 8:00 am at [facility]. COVID Test on 08/23/2019 at [place] between hours of 8:00 am to 1:00 pm. OV Appointment scheduled with Dr. Patsey Berthold for 09/20/2019 at 9:00.  Pt is aware of appointments and appointment information mailed to patient. Tiffany Alexander

## 2019-08-21 ENCOUNTER — Ambulatory Visit: Payer: Medicare HMO

## 2019-08-23 ENCOUNTER — Other Ambulatory Visit: Payer: Self-pay

## 2019-08-23 ENCOUNTER — Other Ambulatory Visit
Admission: RE | Admit: 2019-08-23 | Discharge: 2019-08-23 | Disposition: A | Discharge status: Home / Self Care | Payer: Medicare HMO | Source: Ambulatory Visit | Attending: Pulmonary Disease | Admitting: Pulmonary Disease

## 2019-08-23 DIAGNOSIS — Z20822 Contact with and (suspected) exposure to covid-19: Secondary | ICD-10-CM | POA: Insufficient documentation

## 2019-08-23 DIAGNOSIS — Z01812 Encounter for preprocedural laboratory examination: Secondary | ICD-10-CM | POA: Diagnosis not present

## 2019-08-23 LAB — SARS CORONAVIRUS 2 (TAT 6-24 HRS): SARS Coronavirus 2: NEGATIVE

## 2019-08-24 ENCOUNTER — Ambulatory Visit: Payer: Medicare HMO | Attending: Pulmonary Disease

## 2019-08-24 ENCOUNTER — Other Ambulatory Visit: Payer: Self-pay

## 2019-08-24 DIAGNOSIS — J8410 Pulmonary fibrosis, unspecified: Secondary | ICD-10-CM | POA: Diagnosis not present

## 2019-08-24 MED ORDER — ALBUTEROL SULFATE (2.5 MG/3ML) 0.083% IN NEBU
2.5000 mg | INHALATION_SOLUTION | Freq: Once | RESPIRATORY_TRACT | Status: AC
  Administered 2019-08-24: 2.5 mg via RESPIRATORY_TRACT
  Filled 2019-08-24: qty 3

## 2019-08-25 DIAGNOSIS — J8410 Pulmonary fibrosis, unspecified: Secondary | ICD-10-CM | POA: Diagnosis not present

## 2019-09-04 ENCOUNTER — Ambulatory Visit
Admission: RE | Admit: 2019-09-04 | Discharge: 2019-09-04 | Disposition: A | Discharge status: Home / Self Care | Payer: Medicare HMO | Source: Ambulatory Visit | Attending: Family Medicine | Admitting: Family Medicine

## 2019-09-04 DIAGNOSIS — Z1231 Encounter for screening mammogram for malignant neoplasm of breast: Secondary | ICD-10-CM | POA: Diagnosis not present

## 2019-09-08 DIAGNOSIS — N3946 Mixed incontinence: Secondary | ICD-10-CM | POA: Diagnosis not present

## 2019-09-16 ENCOUNTER — Other Ambulatory Visit: Payer: Self-pay | Admitting: Pulmonary Disease

## 2019-09-20 ENCOUNTER — Encounter: Payer: Self-pay | Admitting: Pulmonary Disease

## 2019-09-20 ENCOUNTER — Other Ambulatory Visit: Payer: Self-pay

## 2019-09-20 ENCOUNTER — Ambulatory Visit (INDEPENDENT_AMBULATORY_CARE_PROVIDER_SITE_OTHER): Payer: Medicare HMO | Admitting: Pulmonary Disease

## 2019-09-20 VITALS — BP 132/78 | HR 88 | Temp 98.3°F | Ht 63.5 in | Wt 205.0 lb

## 2019-09-20 DIAGNOSIS — J4540 Moderate persistent asthma, uncomplicated: Secondary | ICD-10-CM | POA: Diagnosis not present

## 2019-09-20 DIAGNOSIS — R0602 Shortness of breath: Secondary | ICD-10-CM

## 2019-09-20 DIAGNOSIS — J8410 Pulmonary fibrosis, unspecified: Secondary | ICD-10-CM

## 2019-09-20 DIAGNOSIS — R053 Chronic cough: Secondary | ICD-10-CM

## 2019-09-20 DIAGNOSIS — R05 Cough: Secondary | ICD-10-CM | POA: Diagnosis not present

## 2019-09-20 NOTE — Progress Notes (Signed)
Subjective:    Patient ID: Tiffany Alexander, female    DOB: 1945/11/18, 74 y.o.   MRN: [id number]  HPI Is a 74 year old lifelong never smoker first evaluated on 30 November 2018 but previously followed by Dr. [name].  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  Last visit with me was on 12 January 2019 which started to [facility] and Pulmicort via nebulizer due to persistent issues with inability to afford medications and also secretions that were difficult to expectorate.  Since this change was made she has noted that she has had marked improvement in her shortness of breath she is now doing very well she has no cough and has had no issues with wheezing.  She has not had any hemoptysis.  No orthopnea or paroxysmal nocturnal dyspnea.  No lower extremity edema or calf tenderness.  Overall feels well and looks well.  We discussed PFTs performed 08/24/2019  Data thus far: 07/08/17 PFTs:FVC 2.61 L, 98% predicted. FEV1 2.17 L, 106% predicted. FEV1 ratio 83%. TLC 77% predicted. DLCO 71% predicted. DLCO/VA 125% predicted 08/03/17 HRCT chest:Very mild nonspecific interstitial changes with basilar predominance. Multiple tiny pulmonary nodules scattered throughout the lungs bilaterally measuring 5 mm or less in size, nonspecific but statistically likely benign.  01/04/18 PFTs:FVC: 2.56 >2.63 L (97 >99 %pred), FEV1: 2.08 >2.13 L (102 >105 %pred), FEV1/FVC: 81%, TLC: 4.29 L (87 %pred), DLCO 87 %pred 11/08/2018:Eastern allergen profile was negative, total IgE not done. CBC with differential showed no eosinophilia. 08/24/2019 PFTs: FEV1 1.88 L or 93% predicted, FEV1/FVC 86%.  No bronchodilator response.  Volumes normal with the exception of low ERV consistent with obesity.  Patient capacity normal.  Overall stable study when compared to previous.  Review of Systems A 10 point review of systems was performed and it  is as noted above otherwise negative.  Allergies  Allergen Reactions  . Codeine Itching  . Erythromycin Other (See Comments)    Stomach cramps  . Penicillins Rash    Has patient had a PCN reaction causing immediate rash, facial/tongue/throat swelling, SOB or lightheadedness with hypotension: Yes Has patient had a PCN reaction causing severe rash involving mucus membranes or skin necrosis: Unknown Has patient had a PCN reaction that required hospitalization No Has patient had a PCN reaction occurring within the last 10 years: No If all of the above answers are "NO", then may proceed with Cephalosporin use.    Current Meds  Medication Sig  . albuterol (VENTOLIN HFA) 108 (90 Base) MCG/ACT inhaler INHALE 2 PUFFS BY MOUTH INTO THE LUNGS EVERY 6 HOURS AS NEEDED FOR WHEEZING  . amLODipine (NORVASC) 10 MG tablet Take 10 mg by mouth at bedtime.  Marland Kitchen ascorbic acid (VITAMIN C) 500 MG tablet Take 500 mg by mouth daily.  Marland Kitchen atenolol (TENORMIN) 100 MG tablet Take 100 mg by mouth daily.  . budesonide (PULMICORT) 0.25 MG/2ML nebulizer solution Take 2 mLs (0.25 mg total) by nebulization 2 (two) times daily.  . calcium citrate-vitamin D (CITRACAL+D) 315-200 MG-UNIT tablet Take 1 tablet by mouth 2 (two) times daily.  . Cholecalciferol (VITAMIN D3) 1000 units CAPS Take by mouth daily.  . citalopram (CELEXA) 10 MG tablet Take 10 mg by mouth at bedtime.   . Cyanocobalamin (VITAMIN B 12 PO) Take 1 tablet by mouth daily.   . diphenoxylate-atropine (LOMOTIL) 2.5-0.025 MG tablet Take 1 tablet by mouth 4 (four) times daily as needed for diarrhea or loose stools.  Marland Kitchen  etodolac (LODINE) 400 MG tablet Take 400 mg by mouth 2 (two) times daily as needed for mild pain.   . fexofenadine (ALLEGRA) 180 MG tablet Take 180 mg by mouth daily.  . fluticasone (FLONASE) 50 MCG/ACT nasal spray Place 2 sprays into both nostrils daily.  . formoterol (PERFOROMIST) 20 MCG/2ML nebulizer solution Take 2 mLs (20 mcg total) by nebulization 2  (two) times daily. Dx:J84.10  . furosemide (LASIX) 20 MG tablet Take 20 mg by mouth daily.  Marland Kitchen HYDROcodone-acetaminophen (NORCO) 5-325 MG tablet Take 1-2 tablets by mouth every 6 (six) hours as needed for moderate pain.  Marland Kitchen ipratropium-albuterol (DUONEB) 0.5-2.5 (3) MG/3ML SOLN Take 3 mLs by nebulization every 6 (six) hours as needed. DX: j84.10  . losartan (COZAAR) 100 MG tablet Take 100 mg by mouth at bedtime. Reported on 05/14/2015  . Magnesium 200 MG TABS Take by mouth 3 (three) times daily as needed.  . Melatonin 10 MG CAPS Take 20 mg by mouth at bedtime as needed.  . metFORMIN (GLUCOPHAGE) 500 MG tablet Take 500 mg by mouth 2 (two) times daily with a meal.  . mirabegron ER (MYRBETRIQ) 50 MG TB24 tablet Take 1 tablet (50 mg total) by mouth daily.  Marland Kitchen omeprazole (PRILOSEC) 40 MG capsule TAKE ONE CAPSULE BY MOUTH AT BEDTIME  . traZODone (DESYREL) 100 MG tablet Take 100 mg by mouth at bedtime.  Marland Kitchen trimethoprim (TRIMPEX) 100 MG tablet Take 100 mg by mouth daily.   Immunization History  Administered Date(s) Administered  . Influenza, High Dose Seasonal PF 12/25/2014, 12/21/2016, 11/25/2017, 12/17/2018  . PFIZER SARS-COV-2 Vaccination 03/20/2019, 04/17/2019  . Pneumococcal Conjugate-13 12/25/2014  . Pneumococcal Polysaccharide-23 08/17/2017       Objective:   Physical Exam BP 132/78 (BP Location: Left Arm, Cuff Size: Normal)   Pulse 88   Temp 98.3 F (36.8 C) (Temporal)   Ht 5' 3.5" (1.613 m)   Wt 205 lb (93 kg)   SpO2 94%   BMI 35.74 kg/m  GENERAL: Awake, alert, fully ambulatory, no respiratory distress. HEAD: Normocephalic, atraumatic.  EYES: Pupils equal, round, reactive to light.  No scleral icterus.  MOUTH: Nose/mouth/throat not examined due to masking requirements for COVID 19. NECK: Supple. No thyromegaly. Trachea midline. No JVD.  No adenopathy. PULMONARY: Excellent air entry bilaterally.  Lungs clear to auscultation bilaterally. CARDIOVASCULAR: S1 and S2. Regular rate and  rhythm.  No rubs, murmurs or gallops heard. GASTROINTESTINAL: Obese abdomen, soft, nondistended. MUSCULOSKELETAL: No joint deformity, no clubbing, no edema.  NEUROLOGIC: No overt focal deficit.  Involuntary athetoid type movements.  No tremor.  Speech is fluent. SKIN: Intact,warm,dry.  On limited exam no rashes. PSYCH: Mood and behavior normal.     Assessment & Plan:     ICD-10-CM   1. Moderate persistent asthma without complication  V49.44    Very well controlled on Perforomist and budesonide via nebulizer Continue same  2. SOBOE (shortness of breath on exertion)  R06.02    Markedly improved on nebulizer therapy  3. Chronic cough  R05    Resolved with nebulizer therapy  4. Very mild pulmonary fibrosis (HCC)  J84.10    Due to prior nitrofurantoin therapy No evidence of progression PFT stable   Discussion:  Patient appears well compensated with regards to her moderate persistent asthma and mild pulmonary fibrosis.  Previous symptoms of shortness of breath and chronic cough markedly improved to resolved on nebulizer therapy.  Continue the same for now.  We will see the patient in follow-up in  6 months time she is to contact us prior to that time should any new problems arise.  [name], MD  PCCM   *This note was dictated using voice recognition software/Dragon.  Despite best efforts to proofread, errors can occur which can change the meaning.  Any change was purely unintentional.

## 2019-09-20 NOTE — Patient Instructions (Signed)
What we discussed today:  Your breathing tests were good.  You have not had deterioration in lung function  Continue your nebulizers as you are doing  We will see you in follow-up in 6 months time call sooner should any new problems arise

## 2019-09-24 DIAGNOSIS — J8410 Pulmonary fibrosis, unspecified: Secondary | ICD-10-CM | POA: Diagnosis not present

## 2019-09-25 DIAGNOSIS — E119 Type 2 diabetes mellitus without complications: Secondary | ICD-10-CM | POA: Diagnosis not present

## 2019-09-25 DIAGNOSIS — S91002A Unspecified open wound, left ankle, initial encounter: Secondary | ICD-10-CM | POA: Diagnosis not present

## 2019-10-05 NOTE — Telephone Encounter (Signed)
Rx for prilosec has been sent to preferred pharmacy.

## 2019-10-05 NOTE — Telephone Encounter (Signed)
Filled initially by Dr. [name].  Unfortunately I cannot mention all the medications in the notes.  We have to go by the medication list that the patient is on.  Can refill x2 more times she will need to have this reassessed.  We can do this on follow-up.

## 2019-10-05 NOTE — Telephone Encounter (Signed)
Dr. Patsey Berthold, please advise on refill.  I do not see this medication mentioned with in last OV note.

## 2019-10-09 DIAGNOSIS — S91002D Unspecified open wound, left ankle, subsequent encounter: Secondary | ICD-10-CM | POA: Diagnosis not present

## 2019-10-09 DIAGNOSIS — R112 Nausea with vomiting, unspecified: Secondary | ICD-10-CM | POA: Diagnosis not present

## 2019-10-09 DIAGNOSIS — I10 Essential (primary) hypertension: Secondary | ICD-10-CM | POA: Diagnosis not present

## 2019-10-09 DIAGNOSIS — E119 Type 2 diabetes mellitus without complications: Secondary | ICD-10-CM | POA: Diagnosis not present

## 2019-10-09 DIAGNOSIS — R21 Rash and other nonspecific skin eruption: Secondary | ICD-10-CM | POA: Diagnosis not present

## 2019-10-25 DIAGNOSIS — J8410 Pulmonary fibrosis, unspecified: Secondary | ICD-10-CM | POA: Diagnosis not present

## 2019-10-26 DIAGNOSIS — N3642 Intrinsic sphincter deficiency (ISD): Secondary | ICD-10-CM | POA: Diagnosis not present

## 2019-11-07 DIAGNOSIS — N3942 Incontinence without sensory awareness: Secondary | ICD-10-CM | POA: Diagnosis not present

## 2019-11-07 DIAGNOSIS — N3946 Mixed incontinence: Secondary | ICD-10-CM | POA: Diagnosis not present

## 2019-11-08 DIAGNOSIS — I10 Essential (primary) hypertension: Secondary | ICD-10-CM | POA: Diagnosis not present

## 2019-11-08 DIAGNOSIS — E119 Type 2 diabetes mellitus without complications: Secondary | ICD-10-CM | POA: Diagnosis not present

## 2019-11-08 DIAGNOSIS — E782 Mixed hyperlipidemia: Secondary | ICD-10-CM | POA: Diagnosis not present

## 2019-11-15 DIAGNOSIS — Z0000 Encounter for general adult medical examination without abnormal findings: Secondary | ICD-10-CM | POA: Diagnosis not present

## 2019-11-15 DIAGNOSIS — I10 Essential (primary) hypertension: Secondary | ICD-10-CM | POA: Diagnosis not present

## 2019-11-15 DIAGNOSIS — E1165 Type 2 diabetes mellitus with hyperglycemia: Secondary | ICD-10-CM | POA: Diagnosis not present

## 2019-11-15 DIAGNOSIS — I872 Venous insufficiency (chronic) (peripheral): Secondary | ICD-10-CM | POA: Diagnosis not present

## 2019-11-15 DIAGNOSIS — E785 Hyperlipidemia, unspecified: Secondary | ICD-10-CM | POA: Diagnosis not present

## 2019-11-23 DIAGNOSIS — H93291 Other abnormal auditory perceptions, right ear: Secondary | ICD-10-CM | POA: Diagnosis not present

## 2019-11-23 DIAGNOSIS — H6121 Impacted cerumen, right ear: Secondary | ICD-10-CM | POA: Diagnosis not present

## 2019-11-25 DIAGNOSIS — J8410 Pulmonary fibrosis, unspecified: Secondary | ICD-10-CM | POA: Diagnosis not present

## 2019-12-25 DIAGNOSIS — J8410 Pulmonary fibrosis, unspecified: Secondary | ICD-10-CM | POA: Diagnosis not present

## 2020-01-02 ENCOUNTER — Telehealth: Payer: Self-pay | Admitting: Pulmonary Disease

## 2020-01-02 DIAGNOSIS — J4540 Moderate persistent asthma, uncomplicated: Secondary | ICD-10-CM

## 2020-01-02 MED ORDER — IPRATROPIUM-ALBUTEROL 0.5-2.5 (3) MG/3ML IN SOLN: 3.0000 mL | Freq: Four times a day (QID) | RESPIRATORY_TRACT | 2 refills | Status: DC | PRN

## 2020-01-02 NOTE — Telephone Encounter (Addendum)
Spoke to patient, who is requesting refill on duoneb. Rx has been sent to preferred pharmacy. Order for nebulizer supplies has been sent to [facility] as requested by patient.  Nothing further needed.

## 2020-01-04 DIAGNOSIS — J8410 Pulmonary fibrosis, unspecified: Secondary | ICD-10-CM | POA: Diagnosis not present

## 2020-01-20 DIAGNOSIS — L299 Pruritus, unspecified: Secondary | ICD-10-CM | POA: Diagnosis not present

## 2020-01-25 DIAGNOSIS — J8410 Pulmonary fibrosis, unspecified: Secondary | ICD-10-CM | POA: Diagnosis not present

## 2020-02-07 DIAGNOSIS — Z79899 Other long term (current) drug therapy: Secondary | ICD-10-CM | POA: Diagnosis not present

## 2020-02-07 DIAGNOSIS — R197 Diarrhea, unspecified: Secondary | ICD-10-CM | POA: Diagnosis not present

## 2020-02-07 DIAGNOSIS — E119 Type 2 diabetes mellitus without complications: Secondary | ICD-10-CM | POA: Diagnosis not present

## 2020-02-07 DIAGNOSIS — Z8371 Family history of colonic polyps: Secondary | ICD-10-CM | POA: Diagnosis not present

## 2020-02-08 DIAGNOSIS — Z79899 Other long term (current) drug therapy: Secondary | ICD-10-CM | POA: Diagnosis not present

## 2020-02-08 DIAGNOSIS — R197 Diarrhea, unspecified: Secondary | ICD-10-CM | POA: Diagnosis not present

## 2020-03-11 ENCOUNTER — Other Ambulatory Visit
Admission: RE | Admit: 2020-03-11 | Discharge: 2020-03-11 | Disposition: A | Discharge status: Home / Self Care | Payer: Medicare HMO | Source: Ambulatory Visit | Attending: Internal Medicine | Admitting: Internal Medicine

## 2020-03-11 DIAGNOSIS — Z20822 Contact with and (suspected) exposure to covid-19: Secondary | ICD-10-CM | POA: Diagnosis not present

## 2020-03-11 DIAGNOSIS — Z01818 Encounter for other preprocedural examination: Secondary | ICD-10-CM | POA: Insufficient documentation

## 2020-03-11 LAB — SARS CORONAVIRUS 2 (TAT 6-24 HRS): SARS Coronavirus 2: NEGATIVE

## 2020-03-12 ENCOUNTER — Encounter: Payer: Self-pay | Admitting: Internal Medicine

## 2020-03-13 ENCOUNTER — Encounter
Admission: RE | Disposition: A | Discharge status: Home / Self Care | Payer: Self-pay | Source: Home / Self Care | Attending: Internal Medicine

## 2020-03-13 ENCOUNTER — Other Ambulatory Visit: Payer: Self-pay

## 2020-03-13 ENCOUNTER — Ambulatory Visit
Admission: RE | Admit: 2020-03-13 | Discharge: 2020-03-13 | Disposition: A | Discharge status: Home / Self Care | Payer: Medicare HMO | Attending: Internal Medicine | Admitting: Internal Medicine

## 2020-03-13 ENCOUNTER — Encounter: Payer: Self-pay | Admitting: Internal Medicine

## 2020-03-13 ENCOUNTER — Ambulatory Visit: Payer: Medicare HMO | Admitting: Anesthesiology

## 2020-03-13 DIAGNOSIS — J8410 Pulmonary fibrosis, unspecified: Secondary | ICD-10-CM | POA: Insufficient documentation

## 2020-03-13 DIAGNOSIS — K591 Functional diarrhea: Secondary | ICD-10-CM | POA: Insufficient documentation

## 2020-03-13 DIAGNOSIS — Z8371 Family history of colonic polyps: Secondary | ICD-10-CM | POA: Diagnosis not present

## 2020-03-13 DIAGNOSIS — Z885 Allergy status to narcotic agent status: Secondary | ICD-10-CM | POA: Insufficient documentation

## 2020-03-13 DIAGNOSIS — K641 Second degree hemorrhoids: Secondary | ICD-10-CM | POA: Diagnosis not present

## 2020-03-13 DIAGNOSIS — E119 Type 2 diabetes mellitus without complications: Secondary | ICD-10-CM | POA: Insufficient documentation

## 2020-03-13 DIAGNOSIS — Z7951 Long term (current) use of inhaled steroids: Secondary | ICD-10-CM | POA: Insufficient documentation

## 2020-03-13 DIAGNOSIS — Z881 Allergy status to other antibiotic agents status: Secondary | ICD-10-CM | POA: Insufficient documentation

## 2020-03-13 DIAGNOSIS — K5289 Other specified noninfective gastroenteritis and colitis: Secondary | ICD-10-CM | POA: Diagnosis not present

## 2020-03-13 DIAGNOSIS — Z87440 Personal history of urinary (tract) infections: Secondary | ICD-10-CM | POA: Diagnosis not present

## 2020-03-13 DIAGNOSIS — K5730 Diverticulosis of large intestine without perforation or abscess without bleeding: Secondary | ICD-10-CM | POA: Diagnosis not present

## 2020-03-13 DIAGNOSIS — Z7984 Long term (current) use of oral hypoglycemic drugs: Secondary | ICD-10-CM | POA: Diagnosis not present

## 2020-03-13 DIAGNOSIS — Z79899 Other long term (current) drug therapy: Secondary | ICD-10-CM | POA: Diagnosis not present

## 2020-03-13 DIAGNOSIS — Z880 Allergy status to penicillin: Secondary | ICD-10-CM | POA: Insufficient documentation

## 2020-03-13 DIAGNOSIS — Z1211 Encounter for screening for malignant neoplasm of colon: Secondary | ICD-10-CM | POA: Diagnosis not present

## 2020-03-13 DIAGNOSIS — R197 Diarrhea, unspecified: Secondary | ICD-10-CM | POA: Diagnosis not present

## 2020-03-13 DIAGNOSIS — K219 Gastro-esophageal reflux disease without esophagitis: Secondary | ICD-10-CM | POA: Diagnosis not present

## 2020-03-13 DIAGNOSIS — K5790 Diverticulosis of intestine, part unspecified, without perforation or abscess without bleeding: Secondary | ICD-10-CM | POA: Diagnosis not present

## 2020-03-13 HISTORY — DX: Carpal tunnel syndrome, left upper limb: G56.02

## 2020-03-13 HISTORY — PX: COLONOSCOPY: SHX5424

## 2020-03-13 HISTORY — DX: Pulmonary fibrosis, unspecified: J84.10

## 2020-03-13 HISTORY — DX: Urinary tract infection, site not specified: N39.0

## 2020-03-13 SURGERY — COLONOSCOPY
Anesthesia: General

## 2020-03-13 MED ORDER — PROPOFOL 500 MG/50ML IV EMUL
INTRAVENOUS | Status: DC | PRN
  Administered 2020-03-13: 175 ug/kg/min via INTRAVENOUS

## 2020-03-13 MED ORDER — PROPOFOL 10 MG/ML IV BOLUS
INTRAVENOUS | Status: DC | PRN
  Administered 2020-03-13: 40 mg via INTRAVENOUS
  Administered 2020-03-13: 30 mg via INTRAVENOUS

## 2020-03-13 MED ORDER — PHENYLEPHRINE HCL (PRESSORS) 10 MG/ML IV SOLN
INTRAVENOUS | Status: DC | PRN
  Administered 2020-03-13: 100 ug via INTRAVENOUS

## 2020-03-13 MED ORDER — SODIUM CHLORIDE 0.9 % IV SOLN: INTRAVENOUS | Status: DC

## 2020-03-13 MED ORDER — PROPOFOL 500 MG/50ML IV EMUL
INTRAVENOUS | Status: AC
  Filled 2020-03-13: qty 50

## 2020-03-13 NOTE — Anesthesia Procedure Notes (Signed)
Date/Time: 03/13/2020 9:25 AM Performed by: Junious Silk, CRNA Pre-anesthesia Checklist: Patient identified, Emergency Drugs available, Suction available, Patient being monitored and Timeout performed Oxygen Delivery Method: Nasal cannula

## 2020-03-13 NOTE — H&P (Signed)
Outpatient short stay form Pre-procedure 03/13/2020 9:05 AM Tiffany Alexander K. Alice Reichert, M.D.  Primary Physician: [place] Pink, M.D.  Reason for visit:  Functional diarrhea  History of present illness: 75 y/o female with family history of colon polyps presents for chronic persistent diarrhea. Infection ruled out with stool studies.     Current Facility-Administered Medications:  .  0.9 %  sodium chloride infusion, , Intravenous, Continuous, Dunbar, Benay Pike, MD, Last Rate: 20 mL/hr at 03/13/20 0838, New Bag at 03/13/20 [id number]  Facility-Administered Medications Ordered in Other Encounters:  .  albuterol (PROVENTIL) (2.5 MG/3ML) 0.083% nebulizer solution 2.5 mg, 2.5 mg, Nebulization, Once, Wilhelmina Mcardle, MD  Medications Prior to Admission  Medication Sig Dispense Refill Last Dose  . amLODipine (NORVASC) 10 MG tablet Take 10 mg by mouth at bedtime.   03/12/2020 at Unknown time  . ascorbic acid (VITAMIN C) 500 MG tablet Take 500 mg by mouth daily.   Past Week at Unknown time  . atenolol (TENORMIN) 100 MG tablet Take 100 mg by mouth daily.   03/12/2020 at Unknown time  . calcium citrate-vitamin D (CITRACAL+D) 315-200 MG-UNIT tablet Take 1 tablet by mouth 2 (two) times daily.   Past Week at Unknown time  . citalopram (CELEXA) 10 MG tablet Take 10 mg by mouth at bedtime.    03/12/2020 at Unknown time  . clotrimazole-betamethasone (LOTRISONE) cream Apply 1 application topically 2 (two) times daily.   Past Month at Unknown time  . Cyanocobalamin (VITAMIN B 12 PO) Take 1 tablet by mouth daily.    Past Week at Unknown time  . diphenhydrAMINE (BENADRYL) 50 MG tablet Take 75 mg by mouth daily as needed for itching.   03/12/2020 at Unknown time  . fluticasone (FLONASE) 50 MCG/ACT nasal spray Place 2 sprays into both nostrils daily.   03/13/2020 at Unknown time  . formoterol (PERFOROMIST) 20 MCG/2ML nebulizer solution Take 2 mLs (20 mcg total) by nebulization 2 (two) times daily. Dx:J84.10 250 mL 11 03/13/2020 at Unknown  time  . furosemide (LASIX) 20 MG tablet Take 20 mg by mouth daily.   03/12/2020 at Unknown time  . glipiZIDE (GLUCOTROL XL) 5 MG 24 hr tablet Take 5 mg by mouth daily with breakfast.   03/13/2020 at Unknown time  . losartan (COZAAR) 100 MG tablet Take 100 mg by mouth at bedtime. Reported on 05/14/2015   03/12/2020 at Unknown time  . Magnesium 200 MG TABS Take by mouth 3 (three) times daily as needed.   Past Week at Unknown time  . Melatonin 10 MG CAPS Take 20 mg by mouth at bedtime as needed.   Past Week at Unknown time  . mirabegron ER (MYRBETRIQ) 50 MG TB24 tablet Take 1 tablet (50 mg total) by mouth daily. 30 tablet 3 03/12/2020 at Unknown time  . Multiple Vitamins-Minerals (WOMENS MULTIVITAMIN PO) Take 1 tablet by mouth daily at 8 pm.   Past Week at Unknown time  . Probiotic Product (PROBIOTIC BLEND PO) Take by mouth daily.   Past Week at Unknown time  . traZODone (DESYREL) 100 MG tablet Take 100 mg by mouth at bedtime.   03/12/2020 at Unknown time  . trimethoprim (TRIMPEX) 100 MG tablet Take 100 mg by mouth daily.   03/12/2020 at Unknown time  . albuterol (VENTOLIN HFA) 108 (90 Base) MCG/ACT inhaler INHALE 2 PUFFS BY MOUTH INTO THE LUNGS EVERY 6 HOURS AS NEEDED FOR WHEEZING (Patient not taking: Reported on 03/13/2020) 18 g 0 Not Taking at Unknown time  .  budesonide (PULMICORT) 0.25 MG/2ML nebulizer solution Take 2 mLs (0.25 mg total) by nebulization 2 (two) times daily. 120 mL 5   . Cholecalciferol (VITAMIN D3) 1000 units CAPS Take by mouth daily. (Patient not taking: Reported on 03/12/2020)   Not Taking at Unknown time  . diphenoxylate-atropine (LOMOTIL) 2.5-0.025 MG tablet Take 1 tablet by mouth 4 (four) times daily as needed for diarrhea or loose stools. (Patient not taking: Reported on 03/13/2020)   Not Taking at Unknown time  . etodolac (LODINE) 400 MG tablet Take 400 mg by mouth 2 (two) times daily as needed for mild pain.      . fexofenadine (ALLEGRA) 180 MG tablet Take 180 mg by mouth daily. (Patient not  taking: Reported on 03/12/2020)   Not Taking at Unknown time  . HYDROcodone-acetaminophen (NORCO) 5-325 MG tablet Take 1-2 tablets by mouth every 6 (six) hours as needed for moderate pain. (Patient not taking: Reported on 03/12/2020) 30 tablet 0 Not Taking at Unknown time  . ipratropium-albuterol (DUONEB) 0.5-2.5 (3) MG/3ML SOLN Take 3 mLs by nebulization every 6 (six) hours as needed. DX: j84.10 360 mL 2   . metFORMIN (GLUCOPHAGE) 500 MG tablet Take 500 mg by mouth 2 (two) times daily with a meal. (Patient not taking: Reported on 03/12/2020)   Not Taking at Unknown time  . omeprazole (PRILOSEC) 40 MG capsule TAKE ONE CAPSULE BY MOUTH AT BEDTIME (Patient not taking: Reported on 03/12/2020) 30 capsule 2 Not Taking at Unknown time  . pantoprazole (PROTONIX) 40 MG tablet Take 40 mg by mouth daily. (Patient not taking: Reported on 03/13/2020)   Not Taking at Unknown time  . rosuvastatin (CRESTOR) 5 MG tablet Take 5 mg by mouth at bedtime.        Allergies  Allergen Reactions  . Codeine Itching  . Erythromycin Other (See Comments)    Stomach cramps  . Penicillins Rash    Has patient had a PCN reaction causing immediate rash, facial/tongue/throat swelling, SOB or lightheadedness with hypotension: Yes Has patient had a PCN reaction causing severe rash involving mucus membranes or skin necrosis: Unknown Has patient had a PCN reaction that required hospitalization No Has patient had a PCN reaction occurring within the last 10 years: No If all of the above answers are "NO", then may proceed with Cephalosporin use.      Past Medical History:  Diagnosis Date  . Arthritis    osteoarthritis  . Carpal tunnel syndrome of left wrist   . Depression   . Diabetes mellitus without complication (HCC)   . Diverticulosis   . GERD (gastroesophageal reflux disease)   . Hypertension   . PONV (postoperative nausea and vomiting)   . PONV (postoperative nausea and vomiting)   . Pulmonary fibrosis (HCC)   . Recurrent  UTI   . SUI (stress urinary incontinence, female)   . Wears dentures    full upper and lower    Review of systems:  Otherwise negative.    Physical Exam  Gen: Alert, oriented. Appears stated age.  HEENT: [place]/AT. PERRLA. Lungs: CTA, no wheezes. CV: RR nl S1, S2. Abd: soft, benign, no masses. BS+ Ext: No edema. Pulses 2+    Planned procedures: Proceed with colonoscopy. The patient understands the nature of the planned procedure, indications, risks, alternatives and potential complications including but not limited to bleeding, infection, perforation, damage to internal organs and possible oversedation/side effects from anesthesia. The patient agrees and gives consent to proceed.  Please refer to procedure notes for  findings, recommendations and patient disposition/instructions.     [place]all[place]i Coupland K. Alice Reichert, M.D. Gastroenterology 03/13/2020  9:05 AM

## 2020-03-13 NOTE — Interval H&P Note (Signed)
History and Physical Interval Note:  03/13/2020 9:06 AM  Tiffany Alexander  has presented today for surgery, with the diagnosis of diarrhea, family history of polyps in colon.  The various methods of treatment have been discussed with the patient and family. After consideration of risks, benefits and other options for treatment, the patient has consented to  Procedure(s): COLONOSCOPY (N/A) as a surgical intervention.  The patient's history has been reviewed, patient examined, no change in status, stable for surgery.  I have reviewed the patient's chart and labs.  Questions were answered to the patient's satisfaction.     [place], [place]

## 2020-03-13 NOTE — Anesthesia Postprocedure Evaluation (Signed)
Anesthesia Post Note  Patient: Nikyla Navedo Osbourne  Procedure(s) Performed: COLONOSCOPY (N/A )  Patient location during evaluation: Endoscopy Anesthesia Type: General Level of consciousness: awake and alert Pain management: pain level controlled Vital Signs Assessment: post-procedure vital signs reviewed and stable Respiratory status: spontaneous breathing, nonlabored ventilation, respiratory function stable and patient connected to nasal cannula oxygen Cardiovascular status: blood pressure returned to baseline and stable Postop Assessment: no apparent nausea or vomiting Anesthetic complications: no   No complications documented.   Last Vitals:  Vitals:   03/13/20 0950 03/13/20 1000  BP: 131/62 107/90  Pulse: 78 72  Resp: 20 (!) 21  Temp:    SpO2: 98% 97%    Last Pain:  Vitals:   03/13/20 0930  TempSrc: Temporal  PainSc:                  Cleda Mccreedy Harlan [place]

## 2020-03-13 NOTE — Transfer of Care (Signed)
Immediate Anesthesia Transfer of Care Note  Patient: Tiffany Alexander  Procedure(s) Performed: COLONOSCOPY (N/A )  Patient Location: PACU  Anesthesia Type:General  Level of Consciousness: awake, alert  and oriented  Airway & Oxygen Therapy: Patient Spontanous Breathing and Patient connected to nasal cannula oxygen  Post-op Assessment: Report given to RN and Post -op Vital signs reviewed and stable  Post vital signs: Reviewed and stable  Last Vitals:  Vitals Value Taken Time  BP 130/111 03/13/20 0938  Temp    Pulse 79 03/13/20 0939  Resp 19 03/13/20 0939  SpO2 97 % 03/13/20 0939  Vitals shown include unvalidated device data.  Last Pain:  Vitals:   03/13/20 0930  TempSrc: Temporal  PainSc:          Complications: No complications documented.

## 2020-03-13 NOTE — Anesthesia Preprocedure Evaluation (Signed)
Anesthesia Evaluation  Patient identified by MRN, date of birth, ID band Patient awake    Reviewed: Allergy & Precautions, H&P , NPO status , Patient's Chart, lab work & pertinent test results  History of Anesthesia Complications (+) PONV and history of anesthetic complications  Airway Mallampati: III  TM Distance: <3 FB Neck ROM: limited    Dental  (+) Upper Dentures, Lower Dentures   Pulmonary neg shortness of breath, asthma ,    Pulmonary exam normal        Cardiovascular Exercise Tolerance: Good hypertension, (-) angina+ CAD  Normal cardiovascular exam     Neuro/Psych PSYCHIATRIC DISORDERS  Neuromuscular disease    GI/Hepatic Neg liver ROS, GERD  Medicated and Controlled,  Endo/Other  diabetes, Type 2  Renal/GU negative Renal ROS  negative genitourinary   Musculoskeletal  (+) Arthritis ,   Abdominal   Peds  Hematology negative hematology ROS (+)   Anesthesia Other Findings Past Medical History: No date: Arthritis     Comment:  osteoarthritis No date: Carpal tunnel syndrome of left wrist No date: Depression No date: Diabetes mellitus without complication (HCC) No date: Diverticulosis No date: GERD (gastroesophageal reflux disease) No date: Hypertension No date: PONV (postoperative nausea and vomiting) No date: PONV (postoperative nausea and vomiting) No date: Pulmonary fibrosis (HCC) No date: Recurrent UTI No date: SUI (stress urinary incontinence, female) No date: Wears dentures     Comment:  full upper and lower  Past Surgical History: No date:  endocervical curettage No date: ABDOMINAL HYSTERECTOMY No date: BLADDER SUSPENSION No date: CARPAL TUNNEL RELEASE; Left 05/14/2015: CARPAL TUNNEL RELEASE; Right     Comment:  Procedure: CARPAL TUNNEL RELEASE;  Surgeon: Hessie Knows, MD;  Location: [facility] ORS;  Service: Orthopedics;                Laterality: Right; 12/17/2015: CATARACT  EXTRACTION W/PHACO; Right     Comment:  Procedure: CATARACT EXTRACTION PHACO AND INTRAOCULAR               LENS PLACEMENT (IOC);  Surgeon: Birder Robson, MD;                Location: [facility] ORS;  Service: Ophthalmology;  Laterality:              Right;  [place] 52.0 AP% 15.4 CDE 7.99 Fluid pack lot #               [id number] 01/21/2016: CATARACT EXTRACTION W/PHACO; Left     Comment:  Procedure: CATARACT EXTRACTION PHACO AND INTRAOCULAR               LENS PLACEMENT (IOC);  Surgeon: Birder Robson, MD;                Location: [facility] ORS;  Service: Ophthalmology;  Laterality:              Left;  [place] 50.4 AP% 19.6 CDE 9.92 FLUID PACK LOT #               [id number] No date: CESAREAN SECTION; N/A 08/24/2014: COLONOSCOPY; N/A     Comment:  Procedure: COLONOSCOPY;  Surgeon: Lollie Sails, MD;              Location: [facility] ENDOSCOPY;  Service: Endoscopy;                Laterality: N/A; 04/05/2015: COLONOSCOPY WITH PROPOFOL; N/A  Comment:  Procedure: COLONOSCOPY WITH PROPOFOL;  Surgeon: Christena Deem, MD;  Location: [facility] ENDOSCOPY;  Service:               Endoscopy;  Laterality: N/A; No date: COLPORRHAPHY No date: EYE SURGERY 07/12/2014: FINGER ARTHRODESIS; Right     Comment:  Procedure: Right middle finger DIP fussion;  Surgeon:               Kennedy Bucker, MD;  Location: [facility] ORS;  Service:               Orthopedics;  Laterality: Right; No date: FRACTURE SURGERY No date: fractured wrist; Right 04/01/2016: HAMMER TOE SURGERY; Left     Comment:  Procedure: HAMMER TOE CORRECTION  Left 2nd  & 3rd toe;                Surgeon: Gwyneth Revels, DPM;  Location: [facility] SURGERY               [facility];  Service: Podiatry;  Laterality: Left;  Special:                Hammer lock implants Left 2nd & 3rd IVA LOcal Diabetic              - oral meds 05/14/2015: OPEN REDUCTION INTERNAL FIXATION (ORIF) METACARPAL; Left     Comment:  Procedure: OPEN REDUCTION INTERNAL FIXATION (ORIF)                METACARPAL;  Surgeon: Kennedy Bucker, MD;  Location: [facility]               ORS;  Service: Orthopedics;  Laterality: Left; No date: SEPTOPLASTY No date: TUBAL LIGATION  BMI    Body Mass Index: 34.54 kg/m      Reproductive/Obstetrics negative OB ROS                             Anesthesia Physical Anesthesia Plan  ASA: III  Anesthesia Plan: General   Post-op Pain Management:    Induction: Intravenous  PONV Risk Score and Plan: Propofol infusion and TIVA  Airway Management Planned: Natural Airway and Nasal Cannula  Additional Equipment:   Intra-op Plan:   Post-operative Plan:   Informed Consent: I have reviewed the patients History and Physical, chart, labs and discussed the procedure including the risks, benefits and alternatives for the proposed anesthesia with the patient or authorized representative who has indicated his/her understanding and acceptance.     Dental Advisory Given  Plan Discussed with: Anesthesiologist, CRNA and Surgeon  Anesthesia Plan Comments: (Patient consented for risks of anesthesia including but not limited to:  - adverse reactions to medications - risk of airway placement if required - damage to eyes, teeth, lips or other oral mucosa - nerve damage due to positioning  - sore throat or hoarseness - Damage to heart, brain, nerves, lungs, other parts of body or loss of life  Patient voiced understanding.)        Anesthesia Quick Evaluation

## 2020-03-13 NOTE — Op Note (Signed)
[facility] Gastroenterology Patient Name: Tiffany Alexander Procedure Date: 03/13/2020 8:21 AM MRN: [id number] Account #: [id number] Date of Birth: 1945/10/26 Admit Type: Outpatient Age: 75 Room: [facility] ENDO ROOM 2 Gender: Female Note Status: Finalized Procedure:             Colonoscopy Indications:           Functional diarrhea Providers:             Benay Pike. Alice Reichert MD, MD Referring MD:          Irven Easterly. Kary Kos, MD (Referring MD) Medicines:             Propofol per Anesthesia Complications:         No immediate complications. Procedure:             Pre-Anesthesia Assessment:                        - The risks and benefits of the procedure and the                         sedation options and risks were discussed with the                         patient. All questions were answered and informed                         consent was obtained.                        - Patient identification and proposed procedure were                         verified prior to the procedure by the nurse. The                         procedure was verified in the procedure room.                        - ASA Grade Assessment: III - A patient with severe                         systemic disease.                        - After reviewing the risks and benefits, the patient                         was deemed in satisfactory condition to undergo the                         procedure.                        After obtaining informed consent, the colonoscope was                         passed under direct vision. Throughout the procedure,                         the patient's blood pressure, pulse, and oxygen  saturations were monitored continuously. The                         Colonoscope was introduced through the anus and                         advanced to the the cecum, identified by appendiceal                         orifice and ileocecal valve. The colonoscopy was                          performed with ease. The quality of the bowel                         preparation was adequate. Findings:      The perianal exam findings include internal hemorrhoids that prolapse       with straining, but spontaneously regress to the resting position (Grade       II).      Non-bleeding internal hemorrhoids were found during retroflexion. The       hemorrhoids were Grade II (internal hemorrhoids that prolapse but reduce       spontaneously).      A moderate amount of stool was found in the entire colon, making       visualization difficult. Lavage of the area was performed using a       moderate amount of sterile water, resulting in clearance with good       visualization.      Normal mucosa was found in the entire colon. Biopsies for histology were       taken with a cold forceps from the random colon for evaluation of       microscopic colitis.      Many medium-mouthed diverticula were found in the entire colon. There       was no evidence of diverticular bleeding.      The exam was otherwise without abnormality. Impression:            - Internal hemorrhoids that prolapse with straining,                         but spontaneously regress to the resting position                         (Grade II) found on perianal exam.                        - Non-bleeding internal hemorrhoids.                        - Stool in the entire examined colon.                        - Normal mucosa in the entire examined colon. Biopsied.                        - The examination was otherwise normal. Recommendation:        - Patient has a contact number available for  emergencies. The signs and symptoms of potential                         delayed complications were discussed with the patient.                         Return to normal activities tomorrow. Written                         discharge instructions were provided to the patient.                        -  Diabetic (ADA) diet.                        - Await pathology results.                        - Repeat colonoscopy in 5 years for screening purposes.                        - Return to GI office in 6 weeks.                        - Follow up with Jacob Moores, PA-C in [ ]  months.                        - The findings and recommendations were discussed with                         the patient. Procedure Code(s):     --- Professional ---                        [phone], Colonoscopy, flexible; with biopsy, single or                         multiple Diagnosis Code(s):     --- Professional ---                        K59.1, Functional diarrhea                        K64.1, Second degree hemorrhoids CPT copyright 2019 [employer]. All rights reserved. The codes documented in this report are preliminary and upon coder review may  be revised to meet current compliance requirements. 2020 MD, MD 03/13/2020 9:37:45 AM This report has been signed electronically. Number of Addenda: 0 Note Initiated On: 03/13/2020 8:21 AM Scope Withdrawal Time: 0 hours 5 minutes 53 seconds  Total Procedure Duration: 0 hours 12 minutes 1 second  Estimated Blood Loss:  Estimated blood loss: none. Estimated blood loss: none.      [facility]

## 2020-03-14 DIAGNOSIS — I10 Essential (primary) hypertension: Secondary | ICD-10-CM | POA: Diagnosis not present

## 2020-03-14 DIAGNOSIS — E1165 Type 2 diabetes mellitus with hyperglycemia: Secondary | ICD-10-CM | POA: Diagnosis not present

## 2020-03-14 LAB — SURGICAL PATHOLOGY

## 2020-03-19 DIAGNOSIS — R311 Benign essential microscopic hematuria: Secondary | ICD-10-CM | POA: Diagnosis not present

## 2020-03-19 DIAGNOSIS — R600 Localized edema: Secondary | ICD-10-CM | POA: Diagnosis not present

## 2020-03-19 DIAGNOSIS — J449 Chronic obstructive pulmonary disease, unspecified: Secondary | ICD-10-CM | POA: Diagnosis not present

## 2020-03-19 DIAGNOSIS — L989 Disorder of the skin and subcutaneous tissue, unspecified: Secondary | ICD-10-CM | POA: Diagnosis not present

## 2020-03-19 DIAGNOSIS — E1165 Type 2 diabetes mellitus with hyperglycemia: Secondary | ICD-10-CM | POA: Diagnosis not present

## 2020-03-19 DIAGNOSIS — I10 Essential (primary) hypertension: Secondary | ICD-10-CM | POA: Diagnosis not present

## 2020-03-19 DIAGNOSIS — I25118 Atherosclerotic heart disease of native coronary artery with other forms of angina pectoris: Secondary | ICD-10-CM | POA: Diagnosis not present

## 2020-03-19 DIAGNOSIS — K529 Noninfective gastroenteritis and colitis, unspecified: Secondary | ICD-10-CM | POA: Diagnosis not present

## 2020-04-01 ENCOUNTER — Ambulatory Visit: Payer: Medicare HMO | Admitting: Pulmonary Disease

## 2020-04-03 DIAGNOSIS — R197 Diarrhea, unspecified: Secondary | ICD-10-CM | POA: Diagnosis not present

## 2020-04-05 ENCOUNTER — Other Ambulatory Visit: Payer: Self-pay | Admitting: Pulmonary Disease

## 2020-04-10 DIAGNOSIS — M722 Plantar fascial fibromatosis: Secondary | ICD-10-CM | POA: Diagnosis not present

## 2020-04-10 DIAGNOSIS — E119 Type 2 diabetes mellitus without complications: Secondary | ICD-10-CM | POA: Diagnosis not present

## 2020-04-10 DIAGNOSIS — M7732 Calcaneal spur, left foot: Secondary | ICD-10-CM | POA: Diagnosis not present

## 2020-04-10 DIAGNOSIS — M79672 Pain in left foot: Secondary | ICD-10-CM | POA: Diagnosis not present

## 2020-04-30 DIAGNOSIS — R319 Hematuria, unspecified: Secondary | ICD-10-CM | POA: Diagnosis not present

## 2020-05-08 DIAGNOSIS — L851 Acquired keratosis [keratoderma] palmaris et plantaris: Secondary | ICD-10-CM | POA: Diagnosis not present

## 2020-05-08 DIAGNOSIS — E119 Type 2 diabetes mellitus without complications: Secondary | ICD-10-CM | POA: Diagnosis not present

## 2020-05-08 DIAGNOSIS — M898X9 Other specified disorders of bone, unspecified site: Secondary | ICD-10-CM | POA: Diagnosis not present

## 2020-05-08 DIAGNOSIS — M722 Plantar fascial fibromatosis: Secondary | ICD-10-CM | POA: Diagnosis not present

## 2020-05-08 DIAGNOSIS — M7732 Calcaneal spur, left foot: Secondary | ICD-10-CM | POA: Diagnosis not present

## 2020-05-30 DIAGNOSIS — K591 Functional diarrhea: Secondary | ICD-10-CM | POA: Diagnosis not present

## 2020-05-30 DIAGNOSIS — Z8371 Family history of colonic polyps: Secondary | ICD-10-CM | POA: Diagnosis not present

## 2020-05-30 DIAGNOSIS — K5730 Diverticulosis of large intestine without perforation or abscess without bleeding: Secondary | ICD-10-CM | POA: Diagnosis not present

## 2020-06-05 ENCOUNTER — Encounter: Payer: Self-pay | Admitting: Pulmonary Disease

## 2020-06-05 ENCOUNTER — Ambulatory Visit (INDEPENDENT_AMBULATORY_CARE_PROVIDER_SITE_OTHER): Payer: Medicare HMO | Admitting: Pulmonary Disease

## 2020-06-05 ENCOUNTER — Other Ambulatory Visit: Payer: Self-pay

## 2020-06-05 VITALS — BP 120/70 | HR 69 | Temp 97.1°F | Ht 63.5 in | Wt 206.0 lb

## 2020-06-05 DIAGNOSIS — R053 Chronic cough: Secondary | ICD-10-CM

## 2020-06-05 DIAGNOSIS — Z8616 Personal history of COVID-19: Secondary | ICD-10-CM

## 2020-06-05 DIAGNOSIS — J8410 Pulmonary fibrosis, unspecified: Secondary | ICD-10-CM

## 2020-06-05 DIAGNOSIS — R0602 Shortness of breath: Secondary | ICD-10-CM | POA: Diagnosis not present

## 2020-06-05 DIAGNOSIS — J4540 Moderate persistent asthma, uncomplicated: Secondary | ICD-10-CM

## 2020-06-05 NOTE — Patient Instructions (Signed)
Continue your Perforomist and Pulmicort.  We will see you in follow-up in 6 months time call sooner should any new problems arise.

## 2020-06-05 NOTE — Progress Notes (Signed)
Subjective:    Patient ID: Tiffany Alexander, female    DOB: 1945-04-05, 75 y.o.   MRN: [id number] Chief Complaint  Patient presents with   Follow-up    Asthma- pt states she's  feeling good, and often forgets she has it.     HPI This is a 75 year old lifelong never smoker previously followed by Dr. Gwendolyn Lima.  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  This is a scheduled visit.  She was last seen on 20 September 2019. For her asthmatic bronchitis/asthma she is on Brovana and Pulmicort via nebulizer doing well with this.  She states that for the most part she "forgets that she has asthma".  Overall she is doing well.  She did have COVID in January 2022, did well with this without need for hospitalization.  DATA: 07/08/17 PFTs: FVC 2.61 L, 98% predicted.  FEV1 2.17 L, 106% predicted.  FEV1 ratio 83%.  TLC 77% predicted.  DLCO 71% predicted.  DLCO/VA 125% predicted 08/03/17 HRCT chest: Very mild nonspecific interstitial changes with basilar predominance.  Multiple tiny pulmonary nodules scattered throughout the lungs bilaterally measuring 5 mm or less in size, nonspecific but statistically likely benign.  01/04/18 PFTs: FVC: 2.56 > 2.63 L (97 > 99 %pred), FEV1: 2.08 > 2.13 L (102 > 105 %pred), FEV1/FVC: 81%, TLC: 4.29 L (87 %pred), DLCO 87 %pred 11/08/2018: [place] allergen profile was negative, total IgE not done.  CBC with differential showed no eosinophilia. 08/24/2019 PFTs: FEV1 1.88 L or 93% predicted, FEV1/FVC 86%.  No bronchodilator response. Volumes normal with the exception of low ERV consistent with obesity.  Patient capacity normal. Overall stable study when compared to previous.  Review of Systems A 10 point review of systems was performed and it is as noted above otherwise negative.  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   Lower urinary tract infectious disease 04/05/2013   Prolapse of female pelvic organs 11/04/2012   Urge incontinence 06/27/2012   Chronic cystitis 01/05/2012   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/05/2012   Incomplete emptying of bladder 01/05/2012   Allergies  Allergen Reactions   Codeine Itching   Erythromycin Other (See Comments)    Stomach cramps   Penicillins Rash    Has patient had a PCN reaction causing immediate rash, facial/tongue/throat swelling, SOB or lightheadedness with hypotension: Yes Has patient had a PCN reaction causing severe rash involving mucus membranes or skin necrosis: Unknown Has patient had a PCN reaction that required hospitalization No Has patient had a PCN reaction occurring within the last 10 years: No If all of the above answers are "NO", then may proceed with Cephalosporin use.    Current Meds  Medication Sig   amLODipine (NORVASC) 10 MG tablet Take 10 mg by mouth at bedtime.   ascorbic acid (VITAMIN C) 500 MG tablet Take 500 mg by mouth daily.   atenolol (TENORMIN) 100 MG tablet Take 100 mg by mouth daily.   budesonide (PULMICORT) 0.25 MG/2ML nebulizer solution  Take 2 mLs (0.25 mg total) by nebulization 2 (two) times daily.   calcium citrate-vitamin D (CITRACAL+D) 315-200 MG-UNIT tablet Take 1 tablet by mouth 2 (two) times daily.   Cholecalciferol (VITAMIN D3) 1000 units CAPS Take by mouth daily.   citalopram (CELEXA) 10 MG tablet Take 10 mg by mouth at bedtime.    clotrimazole-betamethasone (LOTRISONE) cream Apply 1 application topically  2 (two) times daily.   Cyanocobalamin (VITAMIN B 12 PO) Take 1 tablet by mouth daily.    diphenhydrAMINE (BENADRYL) 50 MG tablet Take 75 mg by mouth daily as needed for itching.   diphenoxylate-atropine (LOMOTIL) 2.5-0.025 MG tablet Take 1 tablet by mouth 4 (four) times daily as needed for diarrhea or loose stools.   etodolac (LODINE) 400 MG tablet Take 400 mg by mouth 2 (two) times daily as needed for mild pain.    fexofenadine (ALLEGRA) 180 MG tablet Take 180 mg by mouth daily.   fluticasone (FLONASE) 50 MCG/ACT nasal spray Place 2 sprays into both nostrils daily.   furosemide (LASIX) 20 MG tablet Take 20 mg by mouth daily.   glipiZIDE (GLUCOTROL XL) 5 MG 24 hr tablet Take 5 mg by mouth daily with breakfast.   HYDROcodone-acetaminophen (NORCO) 5-325 MG tablet Take 1-2 tablets by mouth every 6 (six) hours as needed for moderate pain.   ipratropium-albuterol (DUONEB) 0.5-2.5 (3) MG/3ML SOLN Take 3 mLs by nebulization every 6 (six) hours as needed. DX: j84.10   losartan (COZAAR) 100 MG tablet Take 100 mg by mouth at bedtime. Reported on 05/14/2015   Magnesium 200 MG TABS Take by mouth 3 (three) times daily as needed.   Melatonin 10 MG CAPS Take 20 mg by mouth at bedtime as needed.   metFORMIN (GLUCOPHAGE) 500 MG tablet Take 500 mg by mouth 2 (two) times daily with a meal.   mirabegron ER (MYRBETRIQ) 50 MG TB24 tablet Take 1 tablet (50 mg total) by mouth daily.   Multiple Vitamins-Minerals (WOMENS MULTIVITAMIN PO) Take 1 tablet by mouth daily at 8 pm.   omeprazole (PRILOSEC) 40 MG capsule TAKE ONE CAPSULE BY MOUTH AT BEDTIME   PERFOROMIST 20 MCG/2ML nebulizer solution USE 1 VIAL VIA NEBULIZER AND INHALE BY MOUTH 2 TIMES A DAY   Probiotic Product (PROBIOTIC BLEND PO) Take by mouth daily.   rosuvastatin (CRESTOR) 5 MG tablet Take 5 mg by mouth at bedtime.   traZODone (DESYREL) 100 MG tablet Take 100 mg by mouth at bedtime.   trimethoprim (TRIMPEX) 100 MG tablet Take 100 mg by mouth daily.    Immunization History  Administered Date(s) Administered   Influenza, High Dose Seasonal PF 12/25/2014, 12/21/2016, 11/25/2017, 12/17/2018   PFIZER(Purple Top)SARS-COV-2 Vaccination 03/27/2019, 04/17/2019   Pneumococcal Conjugate-13 12/25/2014   Pneumococcal Polysaccharide-23 08/17/2017      Objective:   Physical Exam BP 120/70 (BP Location: Left Arm, Patient Position: Sitting, Cuff Size: Normal)   Pulse 69   Temp (!) 97.1 F (36.2 C) (Temporal)   Ht 5' 3.5" (1.613 m)   Wt 206 lb (93.4 kg)   SpO2 96%   BMI 35.92 kg/m  GENERAL: Woman, awake, alert, fully ambulatory, no respiratory distress.  No conversational dyspnea HEAD: Normocephalic, atraumatic.  EYES: Pupils equal, round, reactive to light.  No scleral icterus.  MOUTH: Nose/mouth/throat not examined due to masking requirements for COVID 19. NECK: Supple. No thyromegaly. Trachea midline. No JVD.  No adenopathy. PULMONARY: Excellent air entry bilaterally.  Lungs clear to auscultation bilaterally. CARDIOVASCULAR: S1 and S2. Regular rate and rhythm.  No rubs, murmurs or gallops heard. GASTROINTESTINAL: Obese abdomen, soft, nondistended. MUSCULOSKELETAL: No joint deformity, no clubbing, no edema.  NEUROLOGIC: No overt focal deficit.  Involuntary athetoid type movements, chronic.  No tremor.  Speech is fluent. SKIN: Intact,warm,dry.  On limited exam no rashes. PSYCH: Mood and behavior normal.     Assessment & Plan:     ICD-10-CM   1. Moderate persistent asthma without complication  H36.43    Well compensated with Perforomist and Pulmicort Continue same    2. SOBOE (shortness of breath on exertion)  R06.02    Improved with use of bronchodilators     3. Chronic cough  R05.3    Resolved with use of bronchodilators Query element of cough variant asthma    4. Very mild pulmonary fibrosis (HCC)  J84.10    Believed to be due to prior nitrofurantoin therapy    5. Personal history of COVID-19  Z86.16    In January 2022 No  hospitalization required Appears to have no sequela thus far     Patient appears to be doing well continue same we will see her in follow-up in 15-month time call sooner should any new problems arise.  [name], MD Advanced [facility]ch[facility]    *This note was dictated using voice recognition software/Dragon.  Despite best efforts to proofread, errors can occur which can change the meaning.  Any change was purely unintentional.

## 2020-06-10 ENCOUNTER — Encounter: Payer: Self-pay | Admitting: Dermatology

## 2020-06-10 ENCOUNTER — Other Ambulatory Visit: Payer: Self-pay

## 2020-06-10 ENCOUNTER — Ambulatory Visit: Payer: Medicare HMO | Admitting: Dermatology

## 2020-06-10 DIAGNOSIS — I872 Venous insufficiency (chronic) (peripheral): Secondary | ICD-10-CM

## 2020-06-10 DIAGNOSIS — G629 Polyneuropathy, unspecified: Secondary | ICD-10-CM

## 2020-06-10 DIAGNOSIS — L817 Pigmented purpuric dermatosis: Secondary | ICD-10-CM | POA: Diagnosis not present

## 2020-06-10 DIAGNOSIS — L299 Pruritus, unspecified: Secondary | ICD-10-CM

## 2020-06-10 MED ORDER — TRIAMCINOLONE ACETONIDE 0.1 % EX CREA: TOPICAL_CREAM | CUTANEOUS | 2 refills | Status: DC

## 2020-06-10 NOTE — Progress Notes (Signed)
   New Patient Visit  Subjective  Tiffany Alexander is a 75 y.o. female who presents for the following: Rash (New pt c/o rash on her feet and ankles x 1 year itching, pt was prescribed Hydroxyzine tablets by her PCP for itching ).  The following portions of the chart were reviewed this encounter and updated as appropriate:   Tobacco  Allergies  Meds  Problems  Med Hx  Surg Hx  Fam Hx     Review of Systems:  No other skin or systemic complaints except as noted in HPI or Assessment and Plan.  Objective  Well appearing patient in no apparent distress; mood and affect are within normal limits.  A focused examination was performed including lower legs, feet and ankles . Relevant physical exam findings are noted in the Assessment and Plan.  Objective  feet and ankles: Erythematous, scaly patches involving the ankle and distal lower leg with associated lower leg edema.    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  Stasis in the legs causes chronic leg swelling, which may result in itchy or painful rashes, skin discoloration, skin texture changes, and sometimes ulceration.  Recommend daily compression hose/stockings- easiest to put on first thing in morning, remove at bedtime.  Elevate legs as much as possible. Avoid salt/sodium rich foods.   Recommend wearing compression stocking daily, put stocking on in the morning  [place] brochure given  Start Triamcinolone 0.1% cream apply to feet and ankles bid x 2 weeks then weekends only prn  Topical steroids (such as triamcinolone, fluocinolone, fluocinonide, mometasone, clobetasol, halobetasol, betamethasone, hydrocortisone) can cause thinning and lightening of the skin if they are used for too long in the same area. Your physician has selected the right strength  medicine for your problem and area affected on the body. Please use your medication only as directed by your physician to prevent side effects.    May Consider Eucrisa or Opzelura at the next office visit   Ordered Medications: triamcinolone (KENALOG) 0.1 %  Return in about 5 weeks (around 07/15/2020) for Stasis dermatits .  IMarye Round, CMA, am acting as scribe for Sarina Ser, MD .  Documentation: I have reviewed the above documentation for accuracy and completeness, and I agree with the above.  Sarina Ser, MD

## 2020-06-10 NOTE — Patient Instructions (Addendum)
Topical steroids (such as triamcinolone, fluocinolone, fluocinonide, mometasone, clobetasol, halobetasol, betamethasone, hydrocortisone) can cause thinning and lightening of the skin if they are used for too long in the same area. Your physician has selected the right strength medicine for your problem and area affected on the body. Please use your medication only as directed by your physician to prevent side effects.   . 

## 2020-06-25 DIAGNOSIS — N390 Urinary tract infection, site not specified: Secondary | ICD-10-CM | POA: Diagnosis not present

## 2020-06-25 DIAGNOSIS — Z87440 Personal history of urinary (tract) infections: Secondary | ICD-10-CM | POA: Diagnosis not present

## 2020-07-11 DIAGNOSIS — E1165 Type 2 diabetes mellitus with hyperglycemia: Secondary | ICD-10-CM | POA: Diagnosis not present

## 2020-07-17 ENCOUNTER — Ambulatory Visit: Payer: Medicare HMO | Admitting: Dermatology

## 2020-07-17 DIAGNOSIS — E119 Type 2 diabetes mellitus without complications: Secondary | ICD-10-CM | POA: Diagnosis not present

## 2020-07-17 DIAGNOSIS — I10 Essential (primary) hypertension: Secondary | ICD-10-CM | POA: Diagnosis not present

## 2020-07-17 DIAGNOSIS — M545 Low back pain, unspecified: Secondary | ICD-10-CM | POA: Diagnosis not present

## 2020-07-17 DIAGNOSIS — M533 Sacrococcygeal disorders, not elsewhere classified: Secondary | ICD-10-CM | POA: Diagnosis not present

## 2020-07-17 DIAGNOSIS — N390 Urinary tract infection, site not specified: Secondary | ICD-10-CM | POA: Diagnosis not present

## 2020-07-18 DIAGNOSIS — Z23 Encounter for immunization: Secondary | ICD-10-CM | POA: Diagnosis not present

## 2020-07-18 DIAGNOSIS — Z7185 Encounter for immunization safety counseling: Secondary | ICD-10-CM | POA: Diagnosis not present

## 2020-07-26 ENCOUNTER — Other Ambulatory Visit: Payer: Self-pay | Admitting: Family Medicine

## 2020-07-26 DIAGNOSIS — Z1231 Encounter for screening mammogram for malignant neoplasm of breast: Secondary | ICD-10-CM

## 2020-07-30 DIAGNOSIS — J8410 Pulmonary fibrosis, unspecified: Secondary | ICD-10-CM | POA: Diagnosis not present

## 2020-07-31 DIAGNOSIS — M48062 Spinal stenosis, lumbar region with neurogenic claudication: Secondary | ICD-10-CM | POA: Diagnosis not present

## 2020-07-31 DIAGNOSIS — M5416 Radiculopathy, lumbar region: Secondary | ICD-10-CM | POA: Diagnosis not present

## 2020-07-31 DIAGNOSIS — M545 Low back pain, unspecified: Secondary | ICD-10-CM | POA: Diagnosis not present

## 2020-07-31 DIAGNOSIS — M5136 Other intervertebral disc degeneration, lumbar region: Secondary | ICD-10-CM | POA: Diagnosis not present

## 2020-08-13 DIAGNOSIS — M48062 Spinal stenosis, lumbar region with neurogenic claudication: Secondary | ICD-10-CM | POA: Diagnosis not present

## 2020-08-13 DIAGNOSIS — M5416 Radiculopathy, lumbar region: Secondary | ICD-10-CM | POA: Diagnosis not present

## 2020-08-13 DIAGNOSIS — M5136 Other intervertebral disc degeneration, lumbar region: Secondary | ICD-10-CM | POA: Diagnosis not present

## 2020-08-15 DIAGNOSIS — E119 Type 2 diabetes mellitus without complications: Secondary | ICD-10-CM | POA: Diagnosis not present

## 2020-09-03 DIAGNOSIS — M48062 Spinal stenosis, lumbar region with neurogenic claudication: Secondary | ICD-10-CM | POA: Diagnosis not present

## 2020-09-03 DIAGNOSIS — R399 Unspecified symptoms and signs involving the genitourinary system: Secondary | ICD-10-CM | POA: Diagnosis not present

## 2020-09-03 DIAGNOSIS — N390 Urinary tract infection, site not specified: Secondary | ICD-10-CM | POA: Diagnosis not present

## 2020-09-03 DIAGNOSIS — M5136 Other intervertebral disc degeneration, lumbar region: Secondary | ICD-10-CM | POA: Diagnosis not present

## 2020-09-03 DIAGNOSIS — M5416 Radiculopathy, lumbar region: Secondary | ICD-10-CM | POA: Diagnosis not present

## 2020-09-05 ENCOUNTER — Ambulatory Visit
Admission: RE | Admit: 2020-09-05 | Discharge: 2020-09-05 | Disposition: A | Discharge status: Home / Self Care | Payer: Medicare HMO | Source: Ambulatory Visit | Attending: Family Medicine | Admitting: Family Medicine

## 2020-09-05 ENCOUNTER — Other Ambulatory Visit: Payer: Self-pay

## 2020-09-05 DIAGNOSIS — Z1231 Encounter for screening mammogram for malignant neoplasm of breast: Secondary | ICD-10-CM | POA: Diagnosis not present

## 2020-09-05 IMAGING — MG DIGITAL SCREENING BILAT W/ TOMO W/ CAD
8 series · 8 of 24 positions shown · non-contrast
Comparison: Previous exam(s).

ACR Breast Density Category a: The breast tissue is almost entirely
fatty.

CLINICAL DATA: Screening.

EXAM:
DIGITAL SCREENING BILATERAL MAMMOGRAM WITH TOMO AND CAD

[L MLO synth-2D]
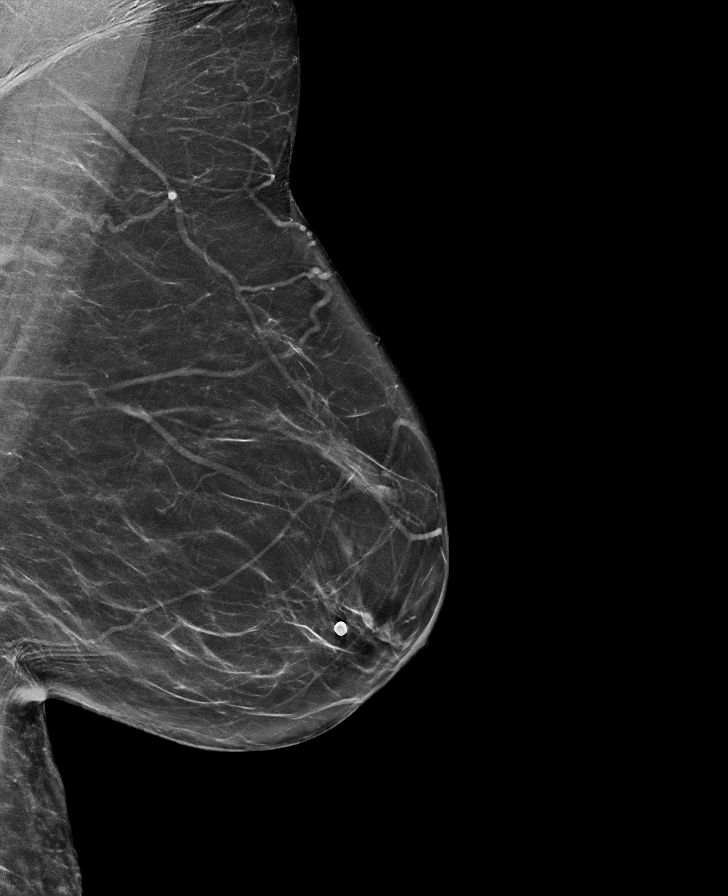

[L CC synth-2D]
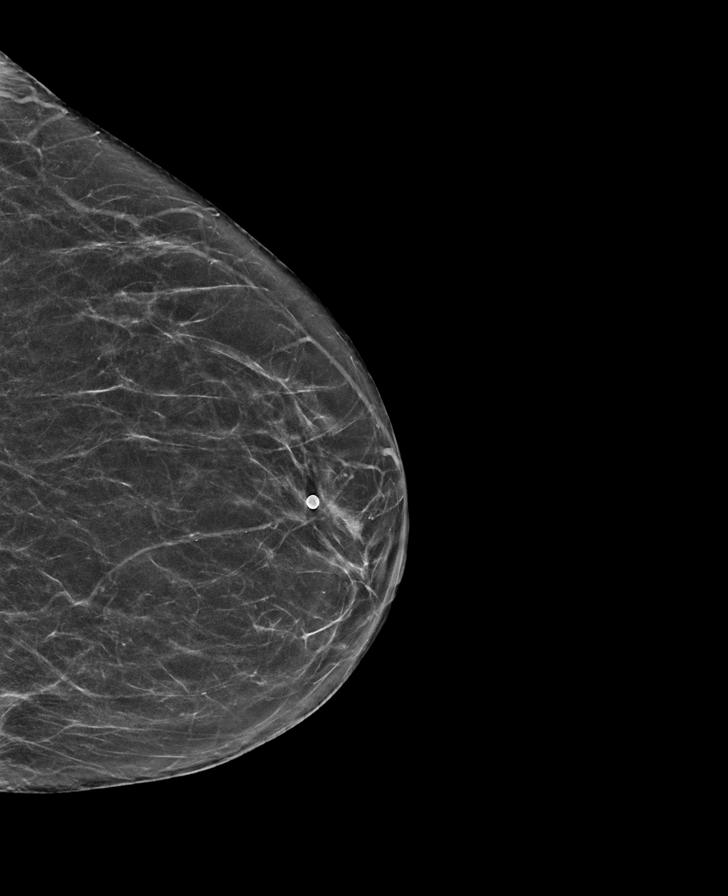

[R CC synth-2D]
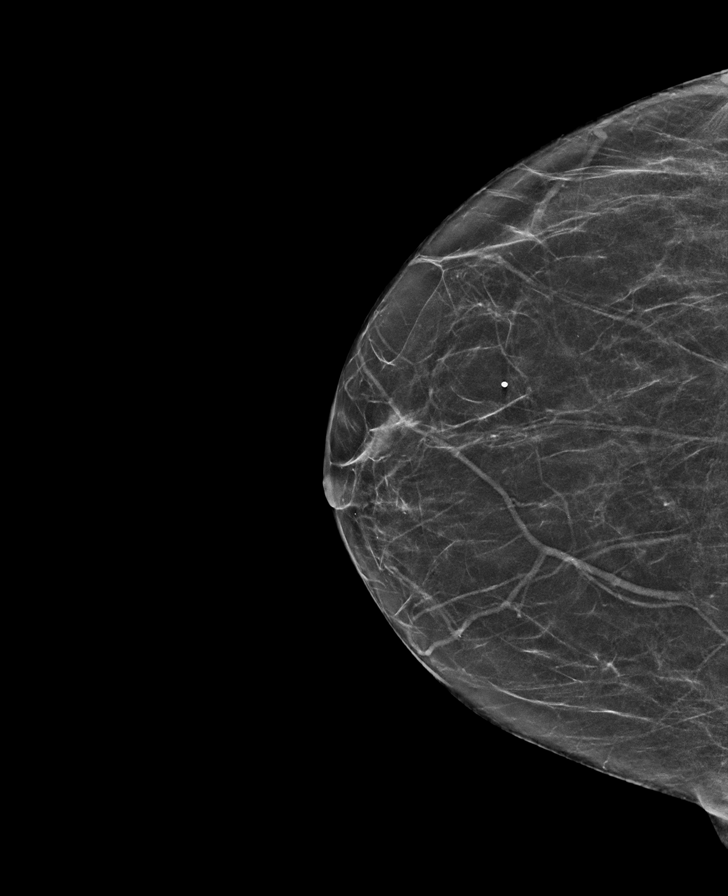

[R MLO synth-2D]
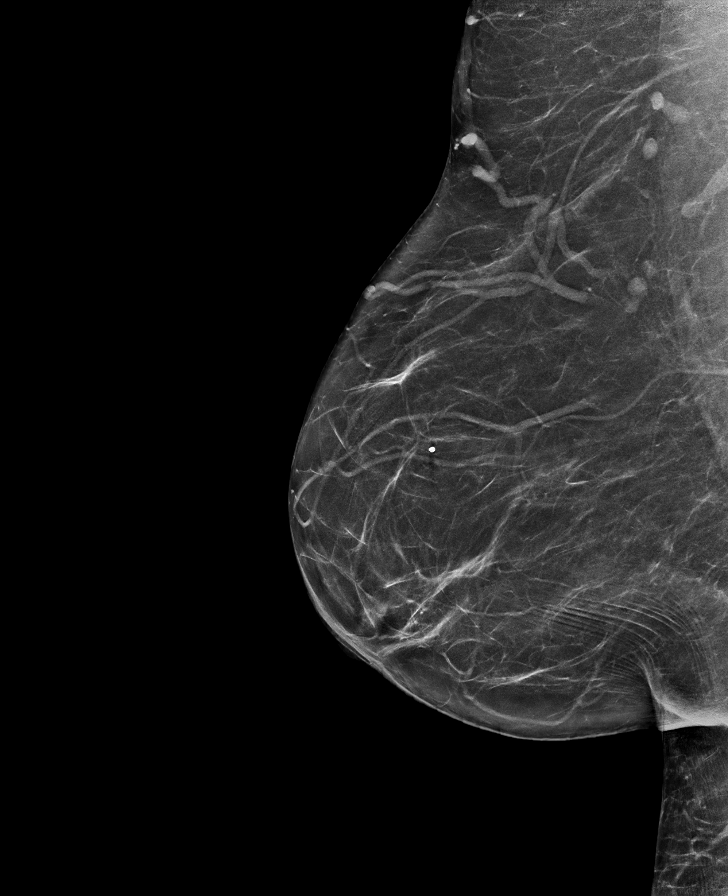

[R MLO tomo · tomo slice 36/71.0]
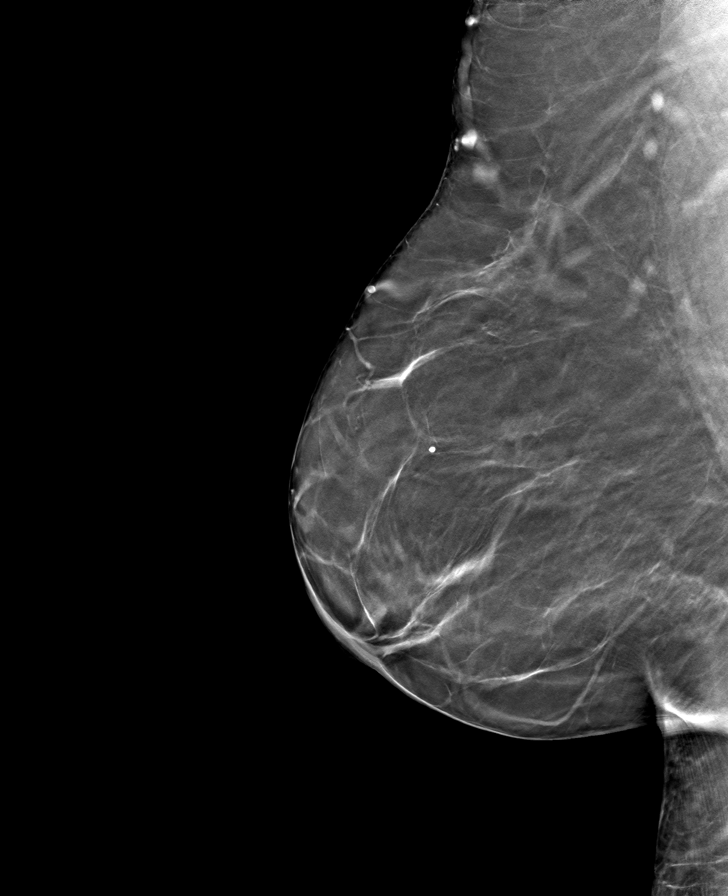

[L CC tomo · tomo slice 31/60.0]
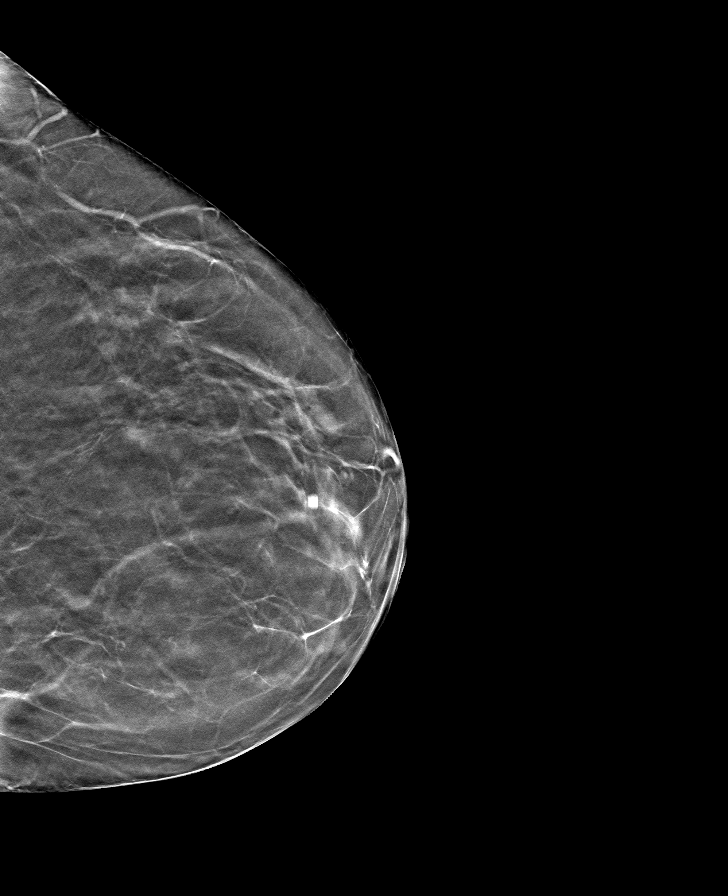

[L MLO tomo · tomo slice 37/72.0]
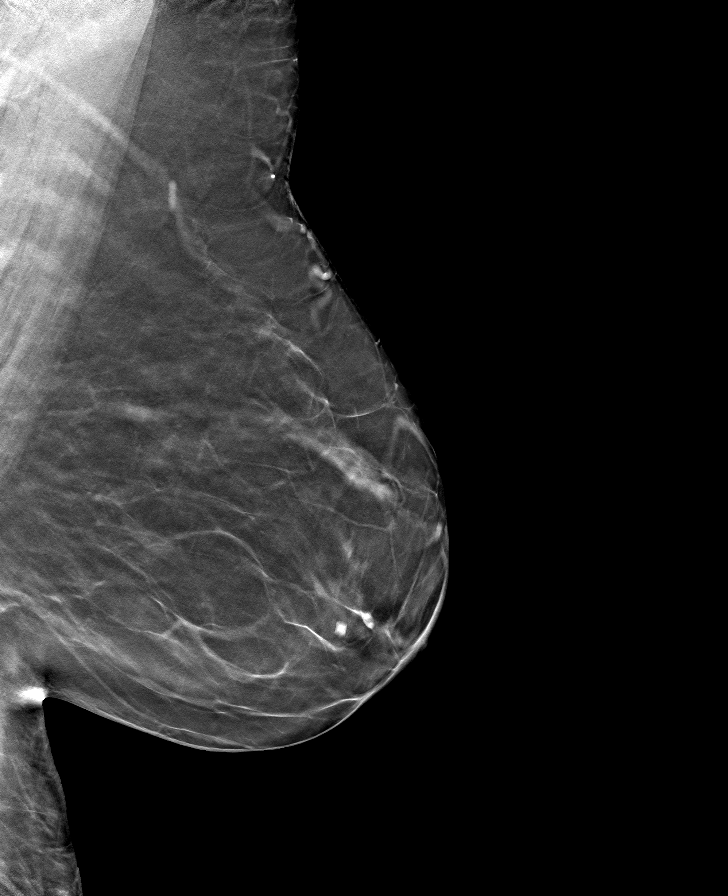

[R CC tomo · tomo slice 29/57.0]
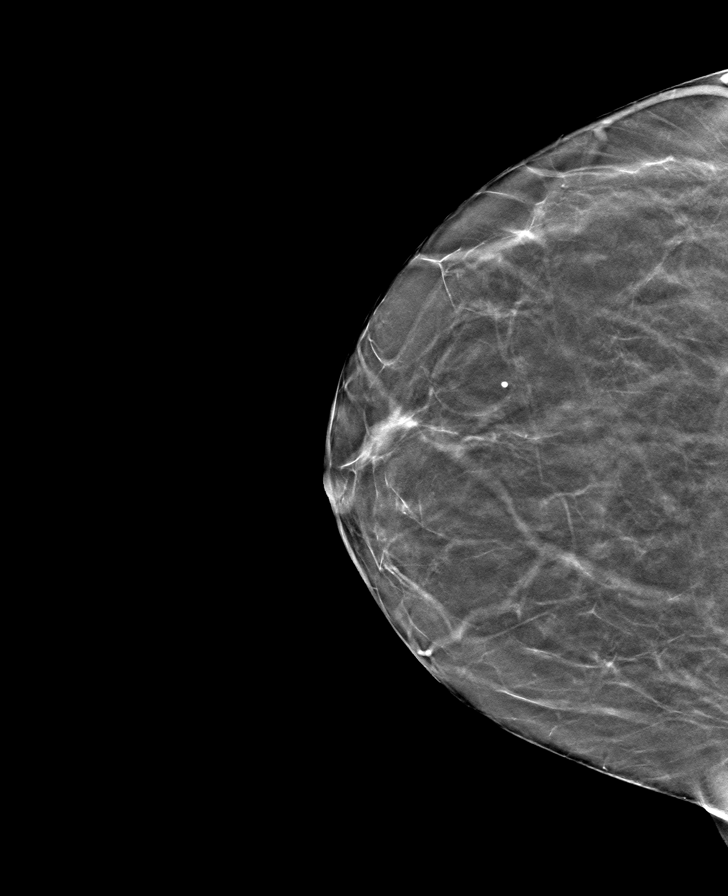

[8 of 24 positions shown; findings below may reference images not displayed]

FINDINGS: There are no findings suspicious for malignancy. Images were
processed with CAD.
IMPRESSION: No mammographic evidence of malignancy. A result letter of this
screening mammogram will be mailed directly to the patient.

RECOMMENDATION:
Screening mammogram in one year. (Code:[id number])

BI-RADS CATEGORY  1: Negative.

## 2020-09-16 ENCOUNTER — Other Ambulatory Visit: Payer: Self-pay | Admitting: Family Medicine

## 2020-09-16 DIAGNOSIS — N6489 Other specified disorders of breast: Secondary | ICD-10-CM

## 2020-09-16 DIAGNOSIS — R928 Other abnormal and inconclusive findings on diagnostic imaging of breast: Secondary | ICD-10-CM

## 2020-09-19 ENCOUNTER — Ambulatory Visit
Admission: RE | Admit: 2020-09-19 | Discharge: 2020-09-19 | Disposition: A | Discharge status: Home / Self Care | Payer: Medicare HMO | Source: Ambulatory Visit | Attending: Family Medicine | Admitting: Family Medicine

## 2020-09-19 ENCOUNTER — Other Ambulatory Visit: Payer: Self-pay

## 2020-09-19 DIAGNOSIS — R928 Other abnormal and inconclusive findings on diagnostic imaging of breast: Secondary | ICD-10-CM | POA: Insufficient documentation

## 2020-09-19 DIAGNOSIS — N6489 Other specified disorders of breast: Secondary | ICD-10-CM | POA: Diagnosis not present

## 2020-09-19 DIAGNOSIS — R922 Inconclusive mammogram: Secondary | ICD-10-CM | POA: Diagnosis not present

## 2020-09-25 DIAGNOSIS — R3915 Urgency of urination: Secondary | ICD-10-CM | POA: Diagnosis not present

## 2020-09-25 DIAGNOSIS — I872 Venous insufficiency (chronic) (peripheral): Secondary | ICD-10-CM | POA: Insufficient documentation

## 2020-09-25 DIAGNOSIS — I890 Lymphedema, not elsewhere classified: Secondary | ICD-10-CM | POA: Insufficient documentation

## 2020-09-25 DIAGNOSIS — N390 Urinary tract infection, site not specified: Secondary | ICD-10-CM | POA: Diagnosis not present

## 2020-09-25 NOTE — Progress Notes (Signed)
MRN : [id number]  Tiffany Alexander is a 75 y.o. ([birth year]) female who presents with chief complaint of No chief complaint on file. Marland Kitchen  History of Present Illness:   Patient is seen for evaluation of leg swelling. The patient first noticed the swelling remotely but is now concerned because of a significant increase in the overall edema. The swelling is associated with pain and discoloration. The patient notes that in the morning the legs are significantly improved but they steadily worsened throughout the course of the day. Elevation makes the legs better, dependency makes them much worse.   There is no history of ulcerations associated with the swelling.   The patient denies any recent changes in their medications.  The patient has not been wearing graduated compression.  The patient has no had any past angiography, interventions or vascular surgery.  The patient denies a history of DVT or PE. There is no prior history of phlebitis. There is no history of primary lymphedema.  Of note the patient is also complaining of a chronic urinary tract infection that has not responded to several treatments to date.  She has specifically asked if she could see a urologist.  There is no history of radiation treatment to the groin or pelvis No history of malignancies. No history of trauma or groin or pelvic surgery. No history of foreign travel or parasitic infections area    No outpatient medications have been marked as taking for the 09/26/20 encounter (Appointment) with Delana Meyer, Dolores Lory, MD.    Past Medical History:  Diagnosis Date   Arthritis    osteoarthritis   Carpal tunnel syndrome of left wrist    Depression    Diabetes mellitus without complication ([place])    Diverticulosis    GERD (gastroesophageal reflux disease)    Hypertension    PONV (postoperative nausea and vomiting)    PONV (postoperative nausea and vomiting)    Pulmonary fibrosis ([place])    Recurrent UTI    SUI  (stress urinary incontinence, female)    Wears dentures    full upper and lower    Past Surgical History:  Procedure Laterality Date    endocervical curettage     ABDOMINAL HYSTERECTOMY     BLADDER SUSPENSION     CARPAL TUNNEL RELEASE Left    CARPAL TUNNEL RELEASE Right 05/14/2015   Procedure: CARPAL TUNNEL RELEASE;  Surgeon: Hessie Knows, MD;  Location: [facility];  Service: Orthopedics;  Laterality: Right;   CATARACT EXTRACTION W/PHACO Right 12/17/2015   Procedure: CATARACT EXTRACTION PHACO AND INTRAOCULAR LENS PLACEMENT (IOC);  Surgeon: Birder Robson, MD;  Location: [facility];  Service: Ophthalmology;  Laterality: Right;  [place] 52.0 AP% 15.4 CDE 7.99 Fluid pack lot # [id number]   CATARACT EXTRACTION W/PHACO Left 01/21/2016   Procedure: CATARACT EXTRACTION PHACO AND INTRAOCULAR LENS PLACEMENT (IOC);  Surgeon: Birder Robson, MD;  Location: [facility];  Service: Ophthalmology;  Laterality: Left;  [place] 50.4 AP% 19.6 CDE 9.92 FLUID PACK LOT # [id number]   CESAREAN SECTION N/A    COLONOSCOPY N/A 08/24/2014   Procedure: COLONOSCOPY;  Surgeon: Lollie Sails, MD;  Location: [facility] ENDOSCOPY;  Service: Endoscopy;  Laterality: N/A;   COLONOSCOPY N/A 03/13/2020   Procedure: COLONOSCOPY;  Surgeon: Toledo, Benay Pike, MD;  Location: [facility] ENDOSCOPY;  Service: Gastroenterology;  Laterality: N/A;   COLONOSCOPY WITH PROPOFOL N/A 04/05/2015   Procedure: COLONOSCOPY WITH PROPOFOL;  Surgeon: Lollie Sails, MD;  Location: [facility] ENDOSCOPY;  Service: Endoscopy;  Laterality: N/A;  COLPORRHAPHY     EYE SURGERY     FINGER ARTHRODESIS Right 07/12/2014   Procedure: Right middle finger DIP fussion;  Surgeon: Hessie Knows, MD;  Location: [facility];  Service: Orthopedics;  Laterality: Right;   FRACTURE SURGERY     fractured wrist Right    HAMMER TOE SURGERY Left 04/01/2016   Procedure: HAMMER TOE CORRECTION  Left 2nd  & 3rd toe;  Surgeon: [name], DPM;  Location: [place];  Service: Podiatry;   Laterality: Left;  Special:  Hammer lock implants Left 2nd & 3rd IVA LOcal Diabetic - oral meds   OPEN REDUCTION INTERNAL FIXATION (ORIF) METACARPAL Left 05/14/2015   Procedure: OPEN REDUCTION INTERNAL FIXATION (ORIF) METACARPAL;  Surgeon: Hessie Knows, MD;  Location: [facility];  Service: Orthopedics;  Laterality: Left;   SEPTOPLASTY     TUBAL LIGATION      Social History Social History   Tobacco Use   Smoking status: Never   Smokeless tobacco: Never  Vaping Use   Vaping Use: Never used  Substance Use Topics   Alcohol use: No   Drug use: No    Family History Family History  Problem Relation Age of Onset   Breast cancer Maternal Aunt    Breast cancer Paternal Grandmother 24  No family history of bleeding/clotting disorders, porphyria or autoimmune disease   Allergies  Allergen Reactions   Codeine Itching   Erythromycin Other (See Comments)    Stomach cramps   Penicillins Rash    Has patient had a PCN reaction causing immediate rash, facial/tongue/throat swelling, SOB or lightheadedness with hypotension: Yes Has patient had a PCN reaction causing severe rash involving mucus membranes or skin necrosis: Unknown Has patient had a PCN reaction that required hospitalization No Has patient had a PCN reaction occurring within the last 10 years: No If all of the above answers are "NO", then may proceed with Cephalosporin use.      REVIEW OF SYSTEMS (Negative unless checked)  Constitutional: [] Weight loss  [] Fever  [] Chills Cardiac: [] Chest pain   [] Chest pressure   [] Palpitations   [] Shortness of breath when laying flat   [] Shortness of breath with exertion. Vascular:  [] Pain in legs with walking   [x] Pain in legs at rest  [] History of DVT   [] Phlebitis   [x] Swelling in legs   [] Varicose veins   [] Non-healing ulcers Pulmonary:   [] Uses home oxygen   [] Productive cough   [] Hemoptysis   [] Wheeze  [] COPD   [] Asthma Neurologic:  [] Dizziness   [] Seizures   [] History of stroke    [] History of TIA  [] Aphasia   [] Vissual changes   [] Weakness or numbness in arm   [] Weakness or numbness in leg Musculoskeletal:   [] Joint swelling   [x] Joint pain   [] Low back pain Hematologic:  [] Easy bruising  [] Easy bleeding   [] Hypercoagulable state   [] Anemic Gastrointestinal:  [] Diarrhea   [] Vomiting  [x] Gastroesophageal reflux/heartburn   [] Difficulty swallowing. Genitourinary:  [] Chronic kidney disease   [] Difficult urination  [] Frequent urination   [] Blood in urine Skin:  [] Rashes   [] Ulcers  Psychological:  [] History of anxiety   []  History of major depression.  Physical Examination  There were no vitals filed for this visit. There is no height or weight on file to calculate BMI. Gen: WD/WN, NAD Head: [place]/AT, No temporalis wasting.  Ear/Nose/Throat: Hearing grossly intact, nares w/o erythema or drainage, poor dentition Eyes: PER, EOMI, sclera nonicteric.  Neck: Supple, no masses.  No bruit or JVD.  Pulmonary:  Good air movement, clear to auscultation bilaterally, no use of accessory muscles.  Cardiac: RRR, normal S1, S2, no Murmurs. Vascular:   scattered varicosities present bilaterally.  Moderat venous stasis changes to the legs bilaterally.  2+ soft pitting edema  Vessel Right Left  Radial Palpable Palpable  PT Palpable Palpable  DP Palpable Palpable  Gastrointestinal: soft, non-distended. No guarding/no peritoneal signs.  Musculoskeletal: M/S 5/5 throughout.  No deformity or atrophy.  Neurologic: CN 2-12 intact. Pain and light touch intact in extremities.  Symmetrical.  Speech is fluent. Motor exam as listed above. Psychiatric: Judgment intact, Mood & affect appropriate for pt's clinical situation. Dermatologic: Moderate venous rashes rashes or ulcers noted.  No changes consistent with cellulitis. Lymph : No Cervical lymphadenopathy, no lichenification or skin changes of chronic lymphedema.  CBC Lab Results  Component Value Date   WBC 5.0 11/08/2018   HGB 12.7  11/08/2018   HCT 38.1 11/08/2018   MCV 90.1 11/08/2018   PLT 227 11/08/2018    BMET    Component Value Date/Time   NA 141 05/14/2015 1132   NA 140 07/02/2014 1506   K 4.2 05/14/2015 1132   K 4.3 07/02/2014 1506   CL 103 07/02/2014 1506   CO2 29 07/02/2014 1506   GLUCOSE 110 (H) 05/14/2015 1132   GLUCOSE 130 (H) 07/02/2014 1506   BUN 22 (H) 07/02/2014 1506   CREATININE 0.70 08/19/2016 0853   CREATININE 0.52 07/02/2014 1506   CALCIUM 9.4 07/02/2014 1506   GFRNONAA >60 07/02/2014 1506   GFRAA >60 07/02/2014 1506   CrCl cannot be calculated (Patient's most recent lab result is older than the maximum 21 days allowed.).  COAG Lab Results  Component Value Date   INR 0.98 08/24/2014   INR 1.0 07/23/2011    Radiology MM DIAG BREAST TOMO UNI RIGHT  Result Date: 09/19/2020 CLINICAL DATA:  75 year old female for further evaluation of possible RIGHT breast asymmetry on screening mammogram. EXAM: DIGITAL DIAGNOSTIC UNILATERAL RIGHT MAMMOGRAM WITH TOMOSYNTHESIS AND CAD TECHNIQUE: Right digital diagnostic mammography and breast tomosynthesis was performed. The images were evaluated with computer-aided detection. COMPARISON:  Previous exam(s). ACR Breast Density Category b: There are scattered areas of fibroglandular density. FINDINGS: 2D/3D full field and spot compression views of the RIGHT breast demonstrate no persistent abnormality at the site of the screening study finding. IMPRESSION: No persistent abnormality at the site of the screening study finding. RECOMMENDATION: Bilateral screening mammogram in 1 year. I have discussed the findings and recommendations with the patient. If applicable, a reminder letter will be sent to the patient regarding the next appointment. BI-RADS CATEGORY  1: Negative. Electronically Signed   By: Margarette Canada M.D.   On: 09/19/2020 13:57  MM 3D SCREEN BREAST BILATERAL  Addendum Date: 09/06/2020   ADDENDUM REPORT: 09/06/2020 16:53 ADDENDUM: Addendum for  correction of original report: CLINICAL DATA:  Screening. EXAM: DIGITAL SCREENING BILATERAL MAMMOGRAM WITH TOMOSYNTHESIS AND CAD TECHNIQUE: Bilateral screening digital craniocaudal and mediolateral oblique mammograms were obtained. Bilateral screening digital breast tomosynthesis was performed. The images were evaluated with computer-aided detection. COMPARISON:  Previous exam(s). ACR Breast Density Category b: There are scattered areas of fibroglandular density. FINDINGS: In the right breast, a possible asymmetry warrants further evaluation. In the left breast, no findings suspicious for malignancy. IMPRESSION: Further evaluation is suggested for possible asymmetry in the right breast. RECOMMENDATION: Diagnostic mammogram and possibly ultrasound of the right breast. (Code:FI-R-18M) The patient will be contacted regarding the findings, and additional imaging will be  scheduled. BI-RADS CATEGORY  0: Incomplete. Need additional imaging evaluation and/or prior mammograms for comparison. Electronically Signed   By: Lovey Newcomer M.D.   On: 09/06/2020 16:53   Result Date: 09/06/2020 CLINICAL DATA:  Screening. EXAM: DIGITAL SCREENING BILATERAL MAMMOGRAM WITH TOMOSYNTHESIS AND CAD TECHNIQUE: Bilateral screening digital craniocaudal and mediolateral oblique mammograms were obtained. Bilateral screening digital breast tomosynthesis was performed. The images were evaluated with computer-aided detection. COMPARISON:  Previous exam(s). ACR Breast Density Category c: The breast tissue is heterogeneously dense, which may obscure small masses. FINDINGS: There are no findings suspicious for malignancy. IMPRESSION: No mammographic evidence of malignancy. A result letter of this screening mammogram will be mailed directly to the patient. RECOMMENDATION: Screening mammogram in one year. (Code:SM-B-01Y) BI-RADS CATEGORY  1: Negative. Electronically Signed: By: Lovey Newcomer M.D. On: 09/06/2020 12:37    Assessment/Plan 1. Chronic venous  insufficiency I have had a long discussion with the patient regarding swelling and why it  causes symptoms.  Patient will begin wearing graduated compression stockings on a daily basis a prescription was given. The patient will  beginning wearing the stockings first thing in the morning and removing them in the evening. The patient is instructed specifically not to sleep in the stockings.   In addition, behavioral modification will be initiated.  This will include frequent elevation, use of over the counter pain medications and exercise such as walking.  I have reviewed systemic causes for chronic edema such as liver, kidney and cardiac etiologies.  The patient denies problems with these organ systems.    Consideration for a lymph pump will also be made based upon the effectiveness of conservative therapy.  This would help to improve the edema control and prevent sequela such as ulcers and infections   Patient should undergo duplex ultrasound of the venous system to ensure that DVT or reflux is not present.  The patient will follow-up with me after the ultrasound.   - VAS [place] LOWER EXTREMITY VENOUS REFLUX; Future  2. Lymphedema I have had a long discussion with the patient regarding swelling and why it  causes symptoms.  Patient will begin wearing graduated compression stockings on a daily basis a prescription was given. The patient will  beginning wearing the stockings first thing in the morning and removing them in the evening. The patient is instructed specifically not to sleep in the stockings.   In addition, behavioral modification will be initiated.  This will include frequent elevation, use of over the counter pain medications and exercise such as walking.  I have reviewed systemic causes for chronic edema such as liver, kidney and cardiac etiologies.  The patient denies problems with these organ systems.    Consideration for a lymph pump will also be made based upon the effectiveness of  conservative therapy.  This would help to improve the edema control and prevent sequela such as ulcers and infections   Patient should undergo duplex ultrasound of the venous system to ensure that DVT or reflux is not present.  The patient will follow-up with me after the ultrasound.    3. Primary hypertension Continue antihypertensive medications as already ordered, these medications have been reviewed and there are no changes at this time.   4. Coronary artery disease involving native coronary artery of native heart, unspecified whether angina present Continue cardiac and antihypertensive medications as already ordered and reviewed, no changes at this time.  Continue statin as ordered and reviewed, no changes at this time  Nitrates PRN for  chest pain   5. Other specified diabetes mellitus with other circulatory complication, unspecified whether long term insulin use (HCC) Continue hypoglycemic medications as already ordered, these medications have been reviewed and there are no changes at this time.  Hgb A1C to be monitored as already arranged by primary service   6. Recurrent urinary tract infection Given her recurrent UTI she has asked to see Dr Bernardo Heater - Ambulatory referral to Urology    Hortencia Pilar, MD  09/25/2020 3:44 PM

## 2020-09-26 ENCOUNTER — Ambulatory Visit (INDEPENDENT_AMBULATORY_CARE_PROVIDER_SITE_OTHER): Payer: Medicare HMO | Admitting: Vascular Surgery

## 2020-09-26 ENCOUNTER — Encounter (INDEPENDENT_AMBULATORY_CARE_PROVIDER_SITE_OTHER): Payer: Self-pay | Admitting: Vascular Surgery

## 2020-09-26 ENCOUNTER — Other Ambulatory Visit: Payer: Self-pay

## 2020-09-26 VITALS — BP 158/64 | HR 80 | Ht 63.0 in | Wt 202.0 lb

## 2020-09-26 DIAGNOSIS — I10 Essential (primary) hypertension: Secondary | ICD-10-CM

## 2020-09-26 DIAGNOSIS — I890 Lymphedema, not elsewhere classified: Secondary | ICD-10-CM | POA: Diagnosis not present

## 2020-09-26 DIAGNOSIS — I872 Venous insufficiency (chronic) (peripheral): Secondary | ICD-10-CM | POA: Diagnosis not present

## 2020-09-26 DIAGNOSIS — N390 Urinary tract infection, site not specified: Secondary | ICD-10-CM | POA: Diagnosis not present

## 2020-09-26 DIAGNOSIS — E1359 Other specified diabetes mellitus with other circulatory complications: Secondary | ICD-10-CM

## 2020-09-26 DIAGNOSIS — I2510 Atherosclerotic heart disease of native coronary artery without angina pectoris: Secondary | ICD-10-CM

## 2020-09-27 ENCOUNTER — Telehealth: Payer: Self-pay | Admitting: Pulmonary Disease

## 2020-09-27 MED ORDER — ALBUTEROL SULFATE HFA 108 (90 BASE) MCG/ACT IN AERS: INHALATION_SPRAY | RESPIRATORY_TRACT | 1 refills | Status: DC

## 2020-09-27 NOTE — Telephone Encounter (Signed)
Rx for Ventolin has been sent to preferred pharmacy. °Patient is aware and voiced her understanding.  °Nothing further needed.  °

## 2020-09-30 DIAGNOSIS — J209 Acute bronchitis, unspecified: Secondary | ICD-10-CM | POA: Diagnosis not present

## 2020-09-30 DIAGNOSIS — Z03818 Encounter for observation for suspected exposure to other biological agents ruled out: Secondary | ICD-10-CM | POA: Diagnosis not present

## 2020-09-30 DIAGNOSIS — J4541 Moderate persistent asthma with (acute) exacerbation: Secondary | ICD-10-CM | POA: Diagnosis not present

## 2020-09-30 DIAGNOSIS — J019 Acute sinusitis, unspecified: Secondary | ICD-10-CM | POA: Diagnosis not present

## 2020-09-30 DIAGNOSIS — B9689 Other specified bacterial agents as the cause of diseases classified elsewhere: Secondary | ICD-10-CM | POA: Diagnosis not present

## 2020-10-08 DIAGNOSIS — M48062 Spinal stenosis, lumbar region with neurogenic claudication: Secondary | ICD-10-CM | POA: Diagnosis not present

## 2020-10-08 DIAGNOSIS — M5416 Radiculopathy, lumbar region: Secondary | ICD-10-CM | POA: Diagnosis not present

## 2020-10-14 ENCOUNTER — Ambulatory Visit: Payer: Medicare HMO | Admitting: Urology

## 2020-10-14 ENCOUNTER — Other Ambulatory Visit: Payer: Self-pay

## 2020-10-14 VITALS — BP 133/59 | HR 77 | Ht 63.5 in | Wt 198.0 lb

## 2020-10-14 DIAGNOSIS — N3020 Other chronic cystitis without hematuria: Secondary | ICD-10-CM | POA: Diagnosis not present

## 2020-10-14 DIAGNOSIS — N390 Urinary tract infection, site not specified: Secondary | ICD-10-CM

## 2020-10-14 MED ORDER — CEPHALEXIN 250 MG PO CAPS: 250.0000 mg | ORAL_CAPSULE | Freq: Every day | ORAL | 11 refills | Status: DC

## 2020-10-14 NOTE — Progress Notes (Signed)
10/14/2020 10:43 AM   Tiffany Alexander Aug 09, 1945 [id number]  Referring provider: Katha Cabal, MD [address] [place],  [place] [address]  Chief Complaint  Patient presents with   Recurrent UTI    HPI: Tiffany Alexander 2020: Adrenal adenoma and chronic bilateral hydronephrosis unchanged from 2013.  History of recurrent bladder infection.  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  Was using successfully trimethoprim and not using estrogen cream.  History of prolapse.  Had InterStim at [place] 12 years ago.  Did not respond to Botox.  Has had oxybutynin in the past.  Had a CT scan which showed no significant dilation of right ureter.  There was no significant dilation of left ureter.  Was given Myrbetriq and did not f/up  Today I reviewed my notes from [facility] August 2021.  She failed Botox in the office and was painful.  She had reached the end of the treatment algorithm.  She had mixed incontinence and leakage without awareness and high-volume bedwetting soaking 6 pads a day.  She had a lot of urethral pain after the Botox out of the ordinary.  She had a bulking agent treatment done March 16, 2019.  She was on trimethoprim.  She had dramatic improvement in her incontinence but then started to leak again.  Bulking agent treatment October 26, 2019.  Technically went very well.  She called back with painful bladder spasms.  She actually did very well for her mixed incontinence with a bulking agent but it had poor durability  She thinks she may have an infection now.  She is getting for 5 bladder infections on trimethoprim.  She gets burning and feels poorly     PMH: Past Medical History:  Diagnosis Date   Arthritis    osteoarthritis   Carpal tunnel syndrome of left wrist    Depression    Diabetes mellitus without complication (HCC)    Diverticulosis    GERD (gastroesophageal reflux disease)    Hypertension    PONV (postoperative nausea and vomiting)    PONV (postoperative  nausea and vomiting)    Pulmonary fibrosis (HCC)    Recurrent UTI    SUI (stress urinary incontinence, female)    Wears dentures    full upper and lower    Surgical History: Past Surgical History:  Procedure Laterality Date    endocervical curettage     ABDOMINAL HYSTERECTOMY     BLADDER SUSPENSION     CARPAL TUNNEL RELEASE Left    CARPAL TUNNEL RELEASE Right 05/14/2015   Procedure: CARPAL TUNNEL RELEASE;  Surgeon: Hessie Knows, MD;  Location: [facility] ORS;  Service: Orthopedics;  Laterality: Right;   CATARACT EXTRACTION W/PHACO Right 12/17/2015   Procedure: CATARACT EXTRACTION PHACO AND INTRAOCULAR LENS PLACEMENT (IOC);  Surgeon: Birder Robson, MD;  Location: [facility] OR[facility];  Service: Ophthalmology;  Laterality: Right;  [place] 52.0 AP% 15.4 CDE 7.99 Fluid pack lot # [id number]   CATARACT EXTRACTION W/PHACO Left 01/21/2016   Procedure: CATARACT EXTRACTION PHACO AND INTRAOCULAR LENS PLACEMENT (IOC);  Surgeon: Birder Robson, MD;  Location: [facility] OR[facility];  Service: Ophthalmology;  Laterality: Left;  [place] 50.4 AP% 19.6 CDE 9.92 FLUID PACK LOT # [id number]   CESAREAN SECTION N/A    COLONOSCOPY N/A 08/24/2014   Procedure: COLONOSCOPY;  Surgeon: Lollie Sails, MD;  Location: [facility] ENDOSCOPY;  Service: Endoscopy;  Laterality: N/A;   COLONOSCOPY N/A 03/13/2020   Procedure: COLONOSCOPY;  Surgeon: Toledo, Benay Pike, MD;  Location: [facility] ENDOSCOPY;  Service: Gastroenterology;  Laterality: N/A;  COLONOSCOPY WITH PROPOFOL N/A 04/05/2015   Procedure: COLONOSCOPY WITH PROPOFOL;  Surgeon: Lollie Sails, MD;  Location: [facility] ENDOSCOPY;  Service: Endoscopy;  Laterality: N/A;   COLPORRHAPHY     EYE SURGERY     FINGER ARTHRODESIS Right 07/12/2014   Procedure: Right middle finger DIP fussion;  Surgeon: Hessie Knows, MD;  Location: [facility] ORS;  Service: Orthopedics;  Laterality: Right;   FRACTURE SURGERY     fractured wrist Right    HAMMER TOE SURGERY Left 04/01/2016   Procedure: HAMMER TOE CORRECTION  Left 2nd  & 3rd  toe;  Surgeon: [name], DPM;  Location: [place];  Service: Podiatry;  Laterality: Left;  Special:  Hammer lock implants Left 2nd & 3rd IVA LOcal Diabetic - oral meds   OPEN REDUCTION INTERNAL FIXATION (ORIF) METACARPAL Left 05/14/2015   Procedure: OPEN REDUCTION INTERNAL FIXATION (ORIF) METACARPAL;  Surgeon: Hessie Knows, MD;  Location: [facility];  Service: Orthopedics;  Laterality: Left;   SEPTOPLASTY     TUBAL LIGATION      Home Medications:  Allergies as of 10/14/2020       Reactions   Codeine Itching   Erythromycin Other (See Comments)   Stomach cramps   Nitrofurantoin Other (See Comments)   Scarring of lung per pt   Penicillins Rash   Has patient had a PCN reaction causing immediate rash, facial/tongue/throat swelling, SOB or lightheadedness with hypotension: Yes Has patient had a PCN reaction causing severe rash involving mucus membranes or skin necrosis: Unknown Has patient had a PCN reaction that required hospitalization No Has patient had a PCN reaction occurring within the last 10 years: No If all of the above answers are "NO", then may proceed with Cephalosporin use.        Medication List        Accurate as of October 14, 2020 10:43 AM. If you have any questions, ask your nurse or doctor.          STOP taking these medications    ciprofloxacin 250 MG tablet Commonly known as: CIPRO Stopped by: Reece Packer, MD       TAKE these medications    albuterol 108 (90 Base) MCG/ACT inhaler Commonly known as: VENTOLIN HFA 2 puffs Q4H PRN   amLODipine 10 MG tablet Commonly known as: NORVASC Take 10 mg by mouth at bedtime.   amLODipine 10 MG tablet Commonly known as: NORVASC Take 1 tablet by mouth daily.   ascorbic acid 500 MG tablet Commonly known as: VITAMIN C Take 500 mg by mouth daily.   atenolol 100 MG tablet Commonly known as: TENORMIN Take 100 mg by mouth daily.   budesonide 0.25 MG/2ML nebulizer solution Commonly known  as: PULMICORT Take 2 mLs (0.25 mg total) by nebulization 2 (two) times daily.   calcium citrate-vitamin D 315-200 MG-UNIT tablet Commonly known as: CITRACAL+D Take 1 tablet by mouth 2 (two) times daily.   citalopram 10 MG tablet Commonly known as: CELEXA Take 10 mg by mouth at bedtime.   citalopram 20 MG tablet Commonly known as: CELEXA Take by mouth.   clotrimazole-betamethasone cream Commonly known as: LOTRISONE Apply 1 application topically 2 (two) times daily.   diphenhydrAMINE 50 MG tablet Commonly known as: BENADRYL Take 75 mg by mouth daily as needed for itching.   diphenoxylate-atropine 2.5-0.025 MG tablet Commonly known as: LOMOTIL Take 1 tablet by mouth 4 (four) times daily as needed for diarrhea or loose stools.   etodolac 400 MG tablet Commonly known  as: LODINE Take 400 mg by mouth 2 (two) times daily as needed for mild pain.   fexofenadine 180 MG tablet Commonly known as: ALLEGRA Take 180 mg by mouth daily.   fluticasone 50 MCG/ACT nasal spray Commonly known as: FLONASE Place 2 sprays into both nostrils daily.   furosemide 20 MG tablet Commonly known as: LASIX Take 20 mg by mouth daily.   glipiZIDE 5 MG 24 hr tablet Commonly known as: GLUCOTROL XL Take 5 mg by mouth daily with breakfast.   HYDROcodone-acetaminophen 5-325 MG tablet Commonly known as: Norco Take 1-2 tablets by mouth every 6 (six) hours as needed for moderate pain.   hydrOXYzine 10 MG tablet Commonly known as: ATARAX/VISTARIL TAKE ONE TABLET BY MOUTH EVERY 8 HOURS AS NEEDED FOR ITCHING   ipratropium-albuterol 0.5-2.5 (3) MG/3ML Soln Commonly known as: DUONEB Take 3 mLs by nebulization every 6 (six) hours as needed. DX: j84.10   losartan 100 MG tablet Commonly known as: COZAAR Take 100 mg by mouth at bedtime. Reported on 05/14/2015   Magnesium 200 MG Tabs Take by mouth 3 (three) times daily as needed.   Melatonin 10 MG Caps Take 20 mg by mouth at bedtime as needed.    metFORMIN 500 MG tablet Commonly known as: GLUCOPHAGE Take 500 mg by mouth 2 (two) times daily with a meal.   mirabegron ER 50 MG Tb24 tablet Commonly known as: MYRBETRIQ Take 1 tablet (50 mg total) by mouth daily.   omeprazole 40 MG capsule Commonly known as: PRILOSEC TAKE ONE CAPSULE BY MOUTH AT BEDTIME   pantoprazole 40 MG tablet Commonly known as: PROTONIX Take 40 mg by mouth daily.   Perforomist 20 MCG/2ML nebulizer solution Generic drug: formoterol USE 1 VIAL VIA NEBULIZER AND INHALE BY MOUTH 2 TIMES A DAY   PROBIOTIC BLEND PO Take by mouth daily.   rosuvastatin 5 MG tablet Commonly known as: CRESTOR Take 5 mg by mouth at bedtime.   traMADol 50 MG tablet Commonly known as: ULTRAM   traZODone 150 MG tablet Commonly known as: DESYREL Take by mouth. What changed: Another medication with the same name was removed. Continue taking this medication, and follow the directions you see here. Changed by: Reece Packer, MD   triamcinolone cream 0.1 % Commonly known as: KENALOG Apply to feet and ankles twice a day x 2 weeks then decrease to weekends only prn   trimethoprim 100 MG tablet Commonly known as: TRIMPEX Take 100 mg by mouth daily.   VITAMIN B 12 PO Take 1 tablet by mouth daily.   Vitamin D3 25 MCG (1000 UT) Caps Take by mouth daily.   WOMENS MULTIVITAMIN PO Take 1 tablet by mouth daily at 8 pm.        Allergies:  Allergies  Allergen Reactions   Codeine Itching   Erythromycin Other (See Comments)    Stomach cramps   Nitrofurantoin Other (See Comments)    Scarring of lung per pt   Penicillins Rash    Has patient had a PCN reaction causing immediate rash, facial/tongue/throat swelling, SOB or lightheadedness with hypotension: Yes Has patient had a PCN reaction causing severe rash involving mucus membranes or skin necrosis: Unknown Has patient had a PCN reaction that required hospitalization No Has patient had a PCN reaction occurring within  the last 10 years: No If all of the above answers are "NO", then may proceed with Cephalosporin use.     Family History: Family History  Problem Relation Age of Onset  Breast cancer Maternal Aunt    Breast cancer Paternal Grandmother 80    Social History:  reports that she has never smoked. She has never used smokeless tobacco. She reports that she does not drink alcohol and does not use drugs.  ROS:                                        Physical Exam: There were no vitals taken for this visit.  Constitutional:  Alert and oriented, No acute distress.  Laboratory Data: Lab Results  Component Value Date   WBC 5.0 11/08/2018   HGB 12.7 11/08/2018   HCT 38.1 11/08/2018   MCV 90.1 11/08/2018   PLT 227 11/08/2018    Lab Results  Component Value Date   CREATININE 0.70 08/19/2016    No results found for: PSA  No results found for: TESTOSTERONE  No results found for: HGBA1C  Urinalysis    Component Value Date/Time   APPEARANCEUR Clear 05/24/2018 0834   GLUCOSEU Negative 05/24/2018 0834   BILIRUBINUR Positive (A) 05/24/2018 0834   PROTEINUR Negative 05/24/2018 0834   NITRITE Negative 05/24/2018 0834   LEUKOCYTESUR Negative 05/24/2018 0834    Pertinent Imaging:   Assessment & Plan: I will switch the patient to daily Keflex.  Minimal dysuria today so I will call if positive.  30x11 Keflex 250 mg sent.  Rash with penicillin discussed.  See in 3 months.  No further treatment for complicated mixed incontinence  1. Recurrent UTI  - Urinalysis, Complete   No follow-ups on file.  Reece Packer, MD  [facility] [address] [place] , [place] [address] [phone]

## 2020-10-15 LAB — MICROSCOPIC EXAMINATION

## 2020-10-15 LAB — URINALYSIS, COMPLETE
Bilirubin, UA: NEGATIVE
Glucose, UA: NEGATIVE
Ketones, UA: NEGATIVE
Nitrite, UA: NEGATIVE
Protein,UA: NEGATIVE
RBC, UA: NEGATIVE
Specific Gravity, UA: 1.01 (ref 1.005–1.030)
Urobilinogen, Ur: 0.2 mg/dL (ref 0.2–1.0)
pH, UA: 5.5 (ref 5.0–7.5)

## 2020-10-17 ENCOUNTER — Telehealth: Payer: Self-pay

## 2020-10-17 LAB — CULTURE, URINE COMPREHENSIVE

## 2020-10-17 MED ORDER — CIPROFLOXACIN HCL 250 MG PO TABS: 250.0000 mg | ORAL_TABLET | Freq: Two times a day (BID) | ORAL | 0 refills | Status: AC

## 2020-10-17 NOTE — Telephone Encounter (Signed)
Spoke with pt. She verbalized understanding. Sent medication to pharmacy.

## 2020-10-17 NOTE — Telephone Encounter (Signed)
-----   Message from Bjorn Loser, MD sent at 10/17/2020 12:40 PM EDT ----- Ciprofloxacin 250 mg twice a day for 7 days Then go back on daily suppression therapy ----- Message ----- From: Alvera Novel, CMA Sent: 10/17/2020  10:26 AM EDT To: Bjorn Loser, MD   ----- Message ----- From: Interface, [facility] Lab Results In Sent: 10/15/2020   5:37 AM EDT To: [facility]

## 2020-10-28 ENCOUNTER — Telehealth: Payer: Self-pay

## 2020-10-28 ENCOUNTER — Other Ambulatory Visit: Payer: Self-pay

## 2020-10-28 DIAGNOSIS — N390 Urinary tract infection, site not specified: Secondary | ICD-10-CM

## 2020-10-28 NOTE — Telephone Encounter (Signed)
Incoming call on triage line from patient in regards to having her urine rechecked to make sure infection has cleared post ABX. Per Lattie Haw, add to lab schedule. Lab appt made, patient confirmed.

## 2020-10-29 ENCOUNTER — Other Ambulatory Visit: Payer: Self-pay

## 2020-10-29 ENCOUNTER — Other Ambulatory Visit: Payer: Medicare HMO

## 2020-10-29 DIAGNOSIS — N390 Urinary tract infection, site not specified: Secondary | ICD-10-CM

## 2020-10-29 LAB — URINALYSIS, COMPLETE
Bilirubin, UA: NEGATIVE
Glucose, UA: NEGATIVE
Leukocytes,UA: NEGATIVE
Nitrite, UA: NEGATIVE
Protein,UA: NEGATIVE
RBC, UA: NEGATIVE
Specific Gravity, UA: 1.025 (ref 1.005–1.030)
Urobilinogen, Ur: 0.2 mg/dL (ref 0.2–1.0)
pH, UA: 5.5 (ref 5.0–7.5)

## 2020-10-29 LAB — MICROSCOPIC EXAMINATION
Bacteria, UA: NONE SEEN
RBC, Urine: NONE SEEN /hpf (ref 0–2)

## 2020-11-21 DIAGNOSIS — E119 Type 2 diabetes mellitus without complications: Secondary | ICD-10-CM | POA: Diagnosis not present

## 2020-11-21 DIAGNOSIS — I10 Essential (primary) hypertension: Secondary | ICD-10-CM | POA: Diagnosis not present

## 2020-11-28 ENCOUNTER — Other Ambulatory Visit: Payer: Self-pay | Admitting: Pulmonary Disease

## 2020-11-28 DIAGNOSIS — N390 Urinary tract infection, site not specified: Secondary | ICD-10-CM | POA: Diagnosis not present

## 2020-11-28 DIAGNOSIS — D649 Anemia, unspecified: Secondary | ICD-10-CM | POA: Diagnosis not present

## 2020-11-28 DIAGNOSIS — E782 Mixed hyperlipidemia: Secondary | ICD-10-CM | POA: Diagnosis not present

## 2020-11-28 DIAGNOSIS — I10 Essential (primary) hypertension: Secondary | ICD-10-CM | POA: Diagnosis not present

## 2020-11-28 DIAGNOSIS — E119 Type 2 diabetes mellitus without complications: Secondary | ICD-10-CM | POA: Diagnosis not present

## 2020-11-28 DIAGNOSIS — Z0000 Encounter for general adult medical examination without abnormal findings: Secondary | ICD-10-CM | POA: Diagnosis not present

## 2020-11-28 DIAGNOSIS — I2510 Atherosclerotic heart disease of native coronary artery without angina pectoris: Secondary | ICD-10-CM | POA: Diagnosis not present

## 2020-11-28 NOTE — Telephone Encounter (Signed)
Patient is requesting refill for Prilosec.  prescribed by Dr. [name].  Dr. Patsey Berthold, please advise. Thanks

## 2020-11-28 NOTE — Telephone Encounter (Signed)
Prefer that she get refill either from primary care or GI.

## 2020-12-03 DIAGNOSIS — M5136 Other intervertebral disc degeneration, lumbar region: Secondary | ICD-10-CM | POA: Diagnosis not present

## 2020-12-03 DIAGNOSIS — M722 Plantar fascial fibromatosis: Secondary | ICD-10-CM | POA: Diagnosis not present

## 2020-12-03 DIAGNOSIS — M48062 Spinal stenosis, lumbar region with neurogenic claudication: Secondary | ICD-10-CM | POA: Diagnosis not present

## 2020-12-03 DIAGNOSIS — M5416 Radiculopathy, lumbar region: Secondary | ICD-10-CM | POA: Diagnosis not present

## 2020-12-05 ENCOUNTER — Encounter: Payer: Self-pay | Admitting: Pulmonary Disease

## 2020-12-05 ENCOUNTER — Other Ambulatory Visit: Payer: Self-pay

## 2020-12-05 ENCOUNTER — Ambulatory Visit: Payer: Medicare HMO | Admitting: Pulmonary Disease

## 2020-12-05 VITALS — BP 130/60 | HR 72 | Temp 97.7°F | Ht 63.5 in | Wt 206.0 lb

## 2020-12-05 DIAGNOSIS — Z8616 Personal history of COVID-19: Secondary | ICD-10-CM

## 2020-12-05 DIAGNOSIS — J849 Interstitial pulmonary disease, unspecified: Secondary | ICD-10-CM

## 2020-12-05 DIAGNOSIS — J4540 Moderate persistent asthma, uncomplicated: Secondary | ICD-10-CM | POA: Diagnosis not present

## 2020-12-05 DIAGNOSIS — J8410 Pulmonary fibrosis, unspecified: Secondary | ICD-10-CM | POA: Diagnosis not present

## 2020-12-05 DIAGNOSIS — R0602 Shortness of breath: Secondary | ICD-10-CM

## 2020-12-05 MED ORDER — BUDESONIDE 0.25 MG/2ML IN SUSP: 0.2500 mg | Freq: Two times a day (BID) | RESPIRATORY_TRACT | 6 refills | Status: DC

## 2020-12-05 NOTE — Patient Instructions (Addendum)
We are scheduling breathing tests  We are scheduling a CT of the chest to reevaluate your lung scarring seen previously  We are scheduling an echocardiogram to evaluate the artery going from the heart to the lungs as this can also cause issues lungs  We will see her in follow-up in 6 to 8 weeks time call sooner should any new problems arise.

## 2020-12-05 NOTE — Progress Notes (Signed)
Subjective:    Patient ID: Tiffany Alexander, female    DOB: 05/26/1945, 75 y.o.   MRN: [id number] Chief Complaint  Patient presents with   Follow-up    Pt states SOB and wheezing.   HPI Is a 75 year old lifelong never smoker first evaluated on 30 November 2018 by me but prior patient of Dr. [name].  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  Visit with me was in March 2022.  She had COVID-19 in January 2022 without need for hospitalization.  She was started on Brovana and Pulmicort in November 2020 due to her inability to afford medications and also due to difficulties with inspissated secretions.  For the most part these interventions have helped her with her respiratory symptoms.  However she states that after her last visit in March she had a "rough summer" she had several prednisone tapers.  She did not call us for interventions.  Currently she states she is feeling better with regards to cough and wheezing and productive sputum however, she does have increased shortness of breath.  She has not had any fevers, chills or sweats.  No orthopnea or paroxysmal nocturnal dyspnea.  No lower extremity edema.  No other complaint.   DATA: 07/08/17 PFTs: FVC 2.61 L, 98% predicted.  FEV1 2.17 L, 106% predicted.  FEV1 ratio 83%.  TLC 77% predicted.  DLCO 71% predicted.  DLCO/VA 125% predicted 08/03/17 HRCT chest: Very mild nonspecific interstitial changes with basilar predominance.  Multiple tiny pulmonary nodules scattered throughout the lungs bilaterally measuring 5 mm or less in size, nonspecific but statistically likely benign.  01/04/18 PFTs: FVC: 2.56 > 2.63 L (97 > 99 %pred), FEV1: 2.08 > 2.13 L (102 > 105 %pred), FEV1/FVC: 81%, TLC: 4.29 L (87 %pred), DLCO 87 %pred 11/08/2018: [place] allergen profile was negative, total IgE not done.  CBC with differential showed no eosinophilia. 08/24/2019 PFTs: FEV1  1.88 L or 93% predicted, FEV1/FVC 86%.  No bronchodilator response. Volumes normal with the exception of low ERV consistent with obesity.  Patient capacity normal. Overall stable study when compared to previous.   Review of Systems A 10 point review of systems was performed and it is as noted above otherwise negative.  Patient Active Problem List   Diagnosis Date Noted   Lymphedema 09/25/2020   Chronic venous insufficiency 09/25/2020   Recurrent urinary tract infection 11/25/2017   Coronary artery disease involving native coronary artery of native heart 08/24/2017   Hyperlipidemia, mixed 08/24/2017   SOBOE (shortness of breath on exertion) 08/24/2017   Positive ANA (antinuclear antibody) 08/20/2017   Pulmonary fibrosis ([place]) 08/20/2017   Status post finger joint fusion 07/16/2014   Allergic rhinitis 05/01/2013   Diabetes ([place]) 05/01/2013   HTN (hypertension) 05/01/2013   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   Lower urinary tract infectious disease 04/05/2013   Prolapse of female pelvic organs 11/04/2012   Urge incontinence 06/27/2012   Chronic cystitis 01/05/2012   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/05/2012   Incomplete emptying of bladder 01/05/2012   Social History   Tobacco Use   Smoking status: Never   Smokeless tobacco: Never  Substance Use Topics   Alcohol use: No    Current Meds  Medication Sig   albuterol (VENTOLIN HFA) 108 (90 Base) MCG/ACT inhaler 2 puffs Q4H PRN   amLODipine (NORVASC) 10 MG tablet Take 10 mg by mouth at bedtime.   ascorbic acid (VITAMIN C) 500 MG tablet Take  500 mg by mouth daily.   atenolol (TENORMIN) 100 MG tablet Take 100 mg by mouth daily.   calcium citrate-vitamin D (CITRACAL+D) 315-200 MG-UNIT tablet Take 1 tablet by mouth 2 (two) times daily.   cephALEXin (KEFLEX) 250 MG capsule Take 1 capsule (250 mg total) by mouth daily.   Cholecalciferol (VITAMIN D3) 1000 units CAPS Take by mouth daily.   citalopram (CELEXA) 20 MG tablet Take by mouth.    Cyanocobalamin (VITAMIN B 12 PO) Take 1 tablet by mouth daily.   diphenhydrAMINE (BENADRYL) 50 MG tablet Take 75 mg by mouth daily as needed for itching.   fluticasone (FLONASE) 50 MCG/ACT nasal spray Place 2 sprays into both nostrils daily.   furosemide (LASIX) 20 MG tablet Take 20 mg by mouth daily.   glipiZIDE (GLUCOTROL XL) 5 MG 24 hr tablet Take 5 mg by mouth daily with breakfast.   hydrOXYzine (ATARAX/VISTARIL) 10 MG tablet TAKE ONE TABLET BY MOUTH EVERY 8 HOURS AS NEEDED FOR ITCHING   ipratropium-albuterol (DUONEB) 0.5-2.5 (3) MG/3ML SOLN Take 3 mLs by nebulization every 6 (six) hours as needed. DX: j84.10   losartan (COZAAR) 100 MG tablet Take 100 mg by mouth at bedtime. Reported on 05/14/2015   Melatonin 10 MG CAPS Take 20 mg by mouth at bedtime as needed.   Multiple Vitamins-Minerals (WOMENS MULTIVITAMIN PO) Take 1 tablet by mouth daily at 8 pm.   omeprazole (PRILOSEC) 40 MG capsule TAKE ONE CAPSULE BY MOUTH AT BEDTIME   PERFOROMIST 20 MCG/2ML nebulizer solution USE 1 VIAL VIA NEBULIZER AND INHALE BY MOUTH 2 TIMES A DAY   Probiotic Product (PROBIOTIC BLEND PO) Take by mouth daily.   traMADol (ULTRAM) 50 MG tablet    traZODone (DESYREL) 150 MG tablet Take by mouth.   Immunization History  Administered Date(s) Administered   Influenza, High Dose Seasonal PF 12/25/2014, 12/21/2016, 11/25/2017, 12/17/2018   PFIZER(Purple Top)SARS-COV-2 Vaccination 03/27/2019, 04/17/2019   Pneumococcal Conjugate-13 12/25/2014   Pneumococcal Polysaccharide-23 08/17/2017     Objective:   Physical Exam BP 130/60 (BP Location: Left Wrist, Patient Position: Sitting, Cuff Size: Normal)   Pulse 72   Temp 97.7 F (36.5 C) (Oral)   Ht 5' 3.5" (1.613 m)   Wt 206 lb (93.4 kg)   SpO2 95%   BMI 35.92 kg/m  GENERAL: Awake, alert, fully ambulatory, no respiratory distress. HEAD: Normocephalic, atraumatic.  EYES: Pupils equal, round, reactive to light.  No scleral icterus.  MOUTH: Nose/mouth/throat not  examined due to masking requirements for COVID 19. NECK: Supple. No thyromegaly. Trachea midline. No JVD.  No adenopathy. PULMONARY: Excellent air entry bilaterally.  Lungs clear to auscultation bilaterally. CARDIOVASCULAR: S1 and S2. Regular rate and rhythm.  No rubs, murmurs or gallops heard. GASTROINTESTINAL: Obese abdomen, soft, nondistended. MUSCULOSKELETAL: No joint deformity, no clubbing, no edema.  NEUROLOGIC: No overt focal deficit.  Involuntary athetoid type movements.  No tremor.  Speech is fluent. SKIN: Intact,warm,dry.  On limited exam no rashes. PSYCH: Mood and behavior normal     Assessment & Plan:     ICD-10-CM   1. Shortness of breath  R06.02 Pulmonary Function Test [facility] Only    CT Chest High Resolution    ECHOCARDIOGRAM COMPLETE   This has increased from baseline Mostly on exertion Reassess with PFTs 2D echo to exclude cardiac sources of dyspnea    2. ILD (interstitial lung disease) (HCC)  J84.9 Pulmonary Function Test [facility] Only    CT Chest High Resolution   High resolution CT Had COVID-19  in January 2022 Query postinflammatory pulmonary fibrosis    3. Very mild pulmonary fibrosis (HCC)  J84.10 budesonide (PULMICORT) 0.25 MG/2ML nebulizer solution   Believed to have been due to Macrodantin use in the past Query postinflammatory element post COVID-19    4. Moderate persistent asthma without complication  D78.24 Pulmonary Function Test [facility] Only    CT Chest High Resolution   Currently without bronchospasm Continue Perforomist and Pulmicort    5. Personal history of COVID-19  Z86.16    This issue adds complexity to her management     Orders Placed This Encounter  Procedures   CT Chest High Resolution    Standing Status:   Future    Standing Expiration Date:   12/05/2021    Order Specific Question:   Preferred imaging location?    Answer:   [facility]   Pulmonary Function Test [facility] Only    Standing Status:   Future    Standing Expiration Date:    12/05/2021    Order Specific Question:   Full PFT: includes the following: basic spirometry, spirometry pre & post bronchodilator, diffusion capacity (DLCO), lung volumes    Answer:   Full PFT    Order Specific Question:   This test can only be performed at    Answer:   [place]   ECHOCARDIOGRAM COMPLETE    Standing Status:   Future    Standing Expiration Date:   12/05/2021    Order Specific Question:   Where should this test be performed    Answer:   [facility]    Order Specific Question:   Perflutren DEFINITY (image enhancing agent) should be administered unless hypersensitivity or allergy exist    Answer:   Administer Perflutren    Order Specific Question:   Reason for exam-Echo    Answer:   Dyspnea  R06.00   We will see the patient in follow-up in 6 to 8 weeks time she is to contact us prior to that time should any new difficulties arise.  [name], MD Advanced Bronchoscopy [facility]    *This note was dictated using voice recognition software/Dragon.  Despite best efforts to proofread, errors can occur which can change the meaning.  Any change was purely unintentional.

## 2020-12-06 DIAGNOSIS — R234 Changes in skin texture: Secondary | ICD-10-CM | POA: Diagnosis not present

## 2020-12-06 DIAGNOSIS — E119 Type 2 diabetes mellitus without complications: Secondary | ICD-10-CM | POA: Diagnosis not present

## 2020-12-13 ENCOUNTER — Telehealth: Payer: Self-pay

## 2020-12-13 NOTE — Telephone Encounter (Signed)
Called and spoke to patient about upcoming Covid test. Patient had a clear understanding nothing further needed

## 2020-12-18 ENCOUNTER — Other Ambulatory Visit
Admission: RE | Admit: 2020-12-18 | Discharge: 2020-12-18 | Disposition: A | Discharge status: Home / Self Care | Payer: Medicare HMO | Source: Ambulatory Visit | Attending: Pulmonary Disease | Admitting: Pulmonary Disease

## 2020-12-18 ENCOUNTER — Other Ambulatory Visit: Payer: Self-pay

## 2020-12-18 DIAGNOSIS — Z01812 Encounter for preprocedural laboratory examination: Secondary | ICD-10-CM | POA: Diagnosis not present

## 2020-12-18 DIAGNOSIS — Z20822 Contact with and (suspected) exposure to covid-19: Secondary | ICD-10-CM | POA: Insufficient documentation

## 2020-12-18 LAB — SARS CORONAVIRUS 2 (TAT 6-24 HRS): SARS Coronavirus 2: NEGATIVE

## 2020-12-19 ENCOUNTER — Telehealth: Payer: Self-pay | Admitting: Pulmonary Disease

## 2020-12-19 ENCOUNTER — Ambulatory Visit: Payer: Medicare HMO | Attending: Pulmonary Disease

## 2020-12-19 DIAGNOSIS — J849 Interstitial pulmonary disease, unspecified: Secondary | ICD-10-CM | POA: Diagnosis not present

## 2020-12-19 DIAGNOSIS — Z8616 Personal history of COVID-19: Secondary | ICD-10-CM | POA: Insufficient documentation

## 2020-12-19 DIAGNOSIS — R0602 Shortness of breath: Secondary | ICD-10-CM | POA: Insufficient documentation

## 2020-12-19 DIAGNOSIS — J4540 Moderate persistent asthma, uncomplicated: Secondary | ICD-10-CM | POA: Diagnosis not present

## 2020-12-19 MED ORDER — ALBUTEROL SULFATE (2.5 MG/3ML) 0.083% IN NEBU
2.5000 mg | INHALATION_SOLUTION | Freq: Once | RESPIRATORY_TRACT | Status: AC
  Administered 2020-12-19: 2.5 mg via RESPIRATORY_TRACT
  Filled 2020-12-19: qty 3

## 2020-12-19 NOTE — Telephone Encounter (Signed)
Tyler Pita, MD  Claudette Head A, CMA Breathing test show that her lung function is stable.  She has an echocardiogram coming up and we will see if that explains her more frequent episodes of shortness of breath.  The breathing test do not show any deterioration of her lung function.   Patient is aware of results and voiced her understanding.  Nothing further needed.

## 2020-12-27 ENCOUNTER — Other Ambulatory Visit: Payer: Self-pay

## 2020-12-27 ENCOUNTER — Ambulatory Visit
Admission: RE | Admit: 2020-12-27 | Discharge: 2020-12-27 | Disposition: A | Discharge status: Home / Self Care | Payer: Medicare HMO | Source: Ambulatory Visit | Attending: Pulmonary Disease | Admitting: Pulmonary Disease

## 2020-12-27 DIAGNOSIS — J4540 Moderate persistent asthma, uncomplicated: Secondary | ICD-10-CM | POA: Insufficient documentation

## 2020-12-27 DIAGNOSIS — J479 Bronchiectasis, uncomplicated: Secondary | ICD-10-CM | POA: Diagnosis not present

## 2020-12-27 DIAGNOSIS — R911 Solitary pulmonary nodule: Secondary | ICD-10-CM | POA: Diagnosis not present

## 2020-12-27 DIAGNOSIS — J849 Interstitial pulmonary disease, unspecified: Secondary | ICD-10-CM | POA: Insufficient documentation

## 2020-12-27 DIAGNOSIS — I700 Atherosclerosis of aorta: Secondary | ICD-10-CM | POA: Diagnosis not present

## 2020-12-27 DIAGNOSIS — R0602 Shortness of breath: Secondary | ICD-10-CM | POA: Diagnosis not present

## 2020-12-27 DIAGNOSIS — R918 Other nonspecific abnormal finding of lung field: Secondary | ICD-10-CM | POA: Diagnosis not present

## 2020-12-31 ENCOUNTER — Telehealth: Payer: Self-pay | Admitting: Pulmonary Disease

## 2020-12-31 NOTE — Telephone Encounter (Signed)
CT scan showed only very MINIMAL changes of fibrosis (scarring) this has been stable and unchanged from prior these are very very mild changes.  This is also corroborated by her breathing tests which were not too bad.  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  This may explain her shortness of breath.  So it is very important that she keeps that echocardiogram appointment.   Patient is aware of results and voiced her understanding. Nothing further needed at this time.

## 2021-01-02 ENCOUNTER — Ambulatory Visit (INDEPENDENT_AMBULATORY_CARE_PROVIDER_SITE_OTHER): Payer: Medicare HMO | Admitting: Vascular Surgery

## 2021-01-02 ENCOUNTER — Encounter (INDEPENDENT_AMBULATORY_CARE_PROVIDER_SITE_OTHER): Payer: Medicare HMO

## 2021-01-09 ENCOUNTER — Ambulatory Visit (INDEPENDENT_AMBULATORY_CARE_PROVIDER_SITE_OTHER): Payer: Medicare HMO

## 2021-01-09 ENCOUNTER — Other Ambulatory Visit: Payer: Self-pay

## 2021-01-09 DIAGNOSIS — R0602 Shortness of breath: Secondary | ICD-10-CM

## 2021-01-09 LAB — ECHOCARDIOGRAM COMPLETE
AR max vel: 2.11 cm2
AV Area VTI: 2.14 cm2
AV Area mean vel: 1.98 cm2
AV Mean grad: 10.4 mmHg
AV Peak grad: 18.2 mmHg
Ao pk vel: 2.13 m/s
Area-P 1/2: 2.12 cm2
Calc EF: 68.3 %
MV VTI: 3.61 cm2
S' Lateral: 3 cm
Single Plane A2C EF: 66 %
Single Plane A4C EF: 69.7 %

## 2021-01-17 DIAGNOSIS — D649 Anemia, unspecified: Secondary | ICD-10-CM | POA: Diagnosis not present

## 2021-01-20 ENCOUNTER — Encounter: Payer: Self-pay | Admitting: Urology

## 2021-01-20 ENCOUNTER — Ambulatory Visit: Payer: Medicare HMO | Admitting: Urology

## 2021-01-20 ENCOUNTER — Other Ambulatory Visit: Payer: Self-pay

## 2021-01-20 ENCOUNTER — Ambulatory Visit: Payer: Self-pay | Admitting: Urology

## 2021-01-20 VITALS — BP 135/74 | HR 76 | Ht 63.5 in | Wt 206.0 lb

## 2021-01-20 DIAGNOSIS — N3020 Other chronic cystitis without hematuria: Secondary | ICD-10-CM | POA: Diagnosis not present

## 2021-01-20 MED ORDER — CEPHALEXIN 250 MG PO CAPS: 250.0000 mg | ORAL_CAPSULE | Freq: Every day | ORAL | 11 refills | Status: DC

## 2021-01-20 NOTE — Progress Notes (Signed)
01/20/2021 8:24 AM   Leward Quan Kinslow [birth year] [id number]  Referring provider: Maryland Pink, MD [address] [place],  [place] [address]  Chief Complaint  Patient presents with   Recurrent UTI    HPI: [name] 2020: Adrenal adenoma and chronic bilateral hydronephrosis unchanged from 2013.  History of recurrent bladder infection.  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  Was using successfully trimethoprim and not using estrogen cream.  History of prolapse.  Had InterStim at [facility] 12 years ago.  Did not respond to Botox.  Has had oxybutynin in the past.  Had a CT scan which showed no significant dilation of right ureter.  There was no significant dilation of left ureter.  Was given Myrbetriq and did not f/up   Today I reviewed my notes from [facility] August 2021.  She failed Botox in the office and was painful.  She had reached the end of the treatment algorithm.  She had mixed incontinence and leakage without awareness and high-volume bedwetting soaking 6 pads a day.  She had a lot of urethral pain after the Botox out of the ordinary.  She had a bulking agent treatment done March 16, 2019.  She was on trimethoprim.  She had dramatic improvement in her incontinence but then started to leak again.  Bulking agent treatment October 26, 2019.  Technically went very well.  She called back with painful bladder spasms.  She actually did very well for her mixed incontinence with a bulking agent but it had poor durability  She thinks she may have an infection now.  She is getting for 5 bladder infections on trimethoprim.  She gets burning and feels poorly    I will switch the patient to daily Keflex.  Minimal dysuria today so I will call if positive.  30x11 Keflex 250 mg sent.  Rash with penicillin discussed.  See in 3 months.  No further treatment for complicated mixed incontinence  Today Infection free on daily Keflex.  90x3 sent to pharmacy.  Incontinence stable.   No active treatment.  Frequency stable    PMH: Past Medical History:  Diagnosis Date   Arthritis    osteoarthritis   Carpal tunnel syndrome of left wrist    Depression    Diabetes mellitus without complication (HCC)    Diverticulosis    GERD (gastroesophageal reflux disease)    Hypertension    PONV (postoperative nausea and vomiting)    PONV (postoperative nausea and vomiting)    Pulmonary fibrosis (HCC)    Recurrent UTI    SUI (stress urinary incontinence, female)    Wears dentures    full upper and lower    Surgical History: Past Surgical History:  Procedure Laterality Date    endocervical curettage     ABDOMINAL HYSTERECTOMY     BLADDER SUSPENSION     CARPAL TUNNEL RELEASE Left    CARPAL TUNNEL RELEASE Right 05/14/2015   Procedure: CARPAL TUNNEL RELEASE;  Surgeon: Hessie Knows, MD;  Location: [facility] ORS;  Service: Orthopedics;  Laterality: Right;   CATARACT EXTRACTION W/PHACO Right 12/17/2015   Procedure: CATARACT EXTRACTION PHACO AND INTRAOCULAR LENS PLACEMENT (IOC);  Surgeon: Birder Robson, MD;  Location: [facility] ORS;  Service: Ophthalmology;  Laterality: Right;  [place] 52.0 AP% 15.4 CDE 7.99 Fluid pack lot # [id number]   CATARACT EXTRACTION W/PHACO Left 01/21/2016   Procedure: CATARACT EXTRACTION PHACO AND INTRAOCULAR LENS PLACEMENT (IOC);  Surgeon: Birder Robson, MD;  Location: [facility] ORS;  Service: Ophthalmology;  Laterality:  Left;  [place] 50.4 AP% 19.6 CDE 9.92 FLUID PACK LOT # [id number]   CESAREAN SECTION N/A    COLONOSCOPY N/A 08/24/2014   Procedure: COLONOSCOPY;  Surgeon: Lollie Sails, MD;  Location: [facility] ENDOSCOPY;  Service: Endoscopy;  Laterality: N/A;   COLONOSCOPY N/A 03/13/2020   Procedure: COLONOSCOPY;  Surgeon: Toledo, Benay Pike, MD;  Location: [facility] ENDOSCOPY;  Service: Gastroenterology;  Laterality: N/A;   COLONOSCOPY WITH PROPOFOL N/A 04/05/2015   Procedure: COLONOSCOPY WITH PROPOFOL;  Surgeon: Lollie Sails, MD;  Location: [facility] ENDOSCOPY;  Service:  Endoscopy;  Laterality: N/A;   COLPORRHAPHY     EYE SURGERY     FINGER ARTHRODESIS Right 07/12/2014   Procedure: Right middle finger DIP fussion;  Surgeon: Hessie Knows, MD;  Location: [facility] ORS;  Service: Orthopedics;  Laterality: Right;   FRACTURE SURGERY     fractured wrist Right    HAMMER TOE SURGERY Left 04/01/2016   Procedure: HAMMER TOE CORRECTION  Left 2nd  & 3rd toe;  Surgeon: [name], DPM;  Location: [facility];  Service: Podiatry;  Laterality: Left;  Special:  Hammer lock implants Left 2nd & 3rd IVA LOcal Diabetic - oral meds   OPEN REDUCTION INTERNAL FIXATION (ORIF) METACARPAL Left 05/14/2015   Procedure: OPEN REDUCTION INTERNAL FIXATION (ORIF) METACARPAL;  Surgeon: Hessie Knows, MD;  Location: [facility] ORS;  Service: Orthopedics;  Laterality: Left;   SEPTOPLASTY     TUBAL LIGATION      Home Medications:  Allergies as of 01/20/2021       Reactions   Codeine Itching   Erythromycin Other (See Comments)   Stomach cramps   Nitrofurantoin Other (See Comments)   Scarring of lung per pt   Penicillins Rash   Has patient had a PCN reaction causing immediate rash, facial/tongue/throat swelling, SOB or lightheadedness with hypotension: Yes Has patient had a PCN reaction causing severe rash involving mucus membranes or skin necrosis: Unknown Has patient had a PCN reaction that required hospitalization No Has patient had a PCN reaction occurring within the last 10 years: No If all of the above answers are "NO", then may proceed with Cephalosporin use.        Medication List        Accurate as of January 20, 2021  8:24 AM. If you have any questions, ask your nurse or doctor.          albuterol 108 (90 Base) MCG/ACT inhaler Commonly known as: VENTOLIN HFA 2 puffs Q4H PRN   amLODipine 10 MG tablet Commonly known as: NORVASC Take 10 mg by mouth at bedtime.   ascorbic acid 500 MG tablet Commonly known as: VITAMIN C Take 500 mg by mouth daily.   atenolol 100  MG tablet Commonly known as: TENORMIN Take 100 mg by mouth daily.   budesonide 0.25 MG/2ML nebulizer solution Commonly known as: PULMICORT Take 2 mLs (0.25 mg total) by nebulization 2 (two) times daily.   calcium citrate-vitamin D 315-200 MG-UNIT tablet Commonly known as: CITRACAL+D Take 1 tablet by mouth 2 (two) times daily.   cephALEXin 250 MG capsule Commonly known as: Keflex Take 1 capsule (250 mg total) by mouth daily.   citalopram 20 MG tablet Commonly known as: CELEXA Take by mouth.   clotrimazole-betamethasone cream Commonly known as: LOTRISONE Apply 1 application topically 2 (two) times daily.   diphenhydrAMINE 50 MG tablet Commonly known as: BENADRYL Take 75 mg by mouth daily as needed for itching.   etodolac 400 MG tablet Commonly known as:  LODINE Take 400 mg by mouth 2 (two) times daily as needed for mild pain.   fexofenadine 180 MG tablet Commonly known as: ALLEGRA Take 180 mg by mouth daily.   fluticasone 50 MCG/ACT nasal spray Commonly known as: FLONASE Place 2 sprays into both nostrils daily.   furosemide 20 MG tablet Commonly known as: LASIX Take 20 mg by mouth daily.   glipiZIDE 5 MG 24 hr tablet Commonly known as: GLUCOTROL XL Take 5 mg by mouth daily with breakfast.   hydrOXYzine 10 MG tablet Commonly known as: ATARAX/VISTARIL TAKE ONE TABLET BY MOUTH EVERY 8 HOURS AS NEEDED FOR ITCHING   ipratropium-albuterol 0.5-2.5 (3) MG/3ML Soln Commonly known as: DUONEB Take 3 mLs by nebulization every 6 (six) hours as needed. DX: j84.10   losartan 100 MG tablet Commonly known as: COZAAR Take 100 mg by mouth at bedtime. Reported on 05/14/2015   Melatonin 10 MG Caps Take 20 mg by mouth at bedtime as needed.   omeprazole 40 MG capsule Commonly known as: PRILOSEC TAKE ONE CAPSULE BY MOUTH AT BEDTIME   Perforomist 20 MCG/2ML nebulizer solution Generic drug: formoterol USE 1 VIAL VIA NEBULIZER AND INHALE BY MOUTH 2 TIMES A DAY   PROBIOTIC BLEND  PO Take by mouth daily.   rosuvastatin 5 MG tablet Commonly known as: CRESTOR Take 5 mg by mouth at bedtime.   traMADol 50 MG tablet Commonly known as: ULTRAM   traZODone 150 MG tablet Commonly known as: DESYREL Take by mouth.   triamcinolone cream 0.1 % Commonly known as: KENALOG Apply to feet and ankles twice a day x 2 weeks then decrease to weekends only prn   VITAMIN B 12 PO Take 1 tablet by mouth daily.   Vitamin D3 25 MCG (1000 UT) Caps Take by mouth daily.   WOMENS MULTIVITAMIN PO Take 1 tablet by mouth daily at 8 pm.        Allergies:  Allergies  Allergen Reactions   Codeine Itching   Erythromycin Other (See Comments)    Stomach cramps   Nitrofurantoin Other (See Comments)    Scarring of lung per pt   Penicillins Rash    Has patient had a PCN reaction causing immediate rash, facial/tongue/throat swelling, SOB or lightheadedness with hypotension: Yes Has patient had a PCN reaction causing severe rash involving mucus membranes or skin necrosis: Unknown Has patient had a PCN reaction that required hospitalization No Has patient had a PCN reaction occurring within the last 10 years: No If all of the above answers are "NO", then may proceed with Cephalosporin use.     Family History: Family History  Problem Relation Age of Onset   Breast cancer Maternal Aunt    Breast cancer Paternal Grandmother 65    Social History:  reports that she has never smoked. She has never used smokeless tobacco. She reports that she does not drink alcohol and does not use drugs.  ROS:                                        Physical Exam: BP 135/74   Pulse 76   Ht 5' 3.5" (1.613 m)   Wt 93.4 kg   BMI 35.92 kg/m   Constitutional:  Alert and oriented, No acute distress. HEENT: [place] AT, moist mucus membranes.  Trachea midline, no masses.   Laboratory Data: Lab Results  Component Value Date  WBC 5.0 11/08/2018   HGB 12.7 11/08/2018   HCT 38.1  11/08/2018   MCV 90.1 11/08/2018   PLT 227 11/08/2018    Lab Results  Component Value Date   CREATININE 0.70 08/19/2016    No results found for: PSA  No results found for: TESTOSTERONE  No results found for: HGBA1C  Urinalysis    Component Value Date/Time   APPEARANCEUR Hazy (A) 10/29/2020 0827   GLUCOSEU Negative 10/29/2020 0827   BILIRUBINUR Negative 10/29/2020 0827   PROTEINUR Negative 10/29/2020 0827   NITRITE Negative 10/29/2020 0827   LEUKOCYTESUR Negative 10/29/2020 0827    Pertinent Imaging:   Assessment & Plan: Reassess in 1 year  There are no diagnoses linked to this encounter.  No follow-ups on file.  Reece Packer, MD  [facility] [address], [place], [place] [address] [phone]

## 2021-01-27 ENCOUNTER — Encounter: Payer: Self-pay | Admitting: Pulmonary Disease

## 2021-01-27 ENCOUNTER — Other Ambulatory Visit: Payer: Self-pay

## 2021-01-27 ENCOUNTER — Other Ambulatory Visit
Admission: RE | Admit: 2021-01-27 | Discharge: 2021-01-27 | Disposition: A | Discharge status: Home / Self Care | Payer: Medicare HMO | Source: Ambulatory Visit | Attending: Pulmonary Disease | Admitting: Pulmonary Disease

## 2021-01-27 ENCOUNTER — Ambulatory Visit (INDEPENDENT_AMBULATORY_CARE_PROVIDER_SITE_OTHER): Payer: Medicare HMO | Admitting: Pulmonary Disease

## 2021-01-27 VITALS — BP 130/66 | HR 73 | Temp 96.9°F | Ht 61.0 in | Wt 209.8 lb

## 2021-01-27 DIAGNOSIS — J8410 Pulmonary fibrosis, unspecified: Secondary | ICD-10-CM | POA: Diagnosis not present

## 2021-01-27 DIAGNOSIS — R0602 Shortness of breath: Secondary | ICD-10-CM

## 2021-01-27 DIAGNOSIS — R5383 Other fatigue: Secondary | ICD-10-CM

## 2021-01-27 DIAGNOSIS — J4540 Moderate persistent asthma, uncomplicated: Secondary | ICD-10-CM | POA: Diagnosis not present

## 2021-01-27 LAB — T4, FREE: Free T4: 0.66 ng/dL (ref 0.61–1.12)

## 2021-01-27 LAB — TSH: TSH: 2.271 u[IU]/mL (ref 0.350–4.500)

## 2021-01-27 NOTE — Progress Notes (Signed)
ov

## 2021-01-27 NOTE — Patient Instructions (Signed)
We are going to order an oxygen level at nighttime, this is: Overnight oximetry.  We are going to check thyroid function.   We will see him in follow-up in 2 to 3 months time call sooner should any new problems arise.

## 2021-01-27 NOTE — Progress Notes (Signed)
Subjective:    Patient ID: Tiffany Alexander, female    DOB: December 03, 1945, 75 y.o.   MRN: [id number] Chief Complaint  Patient presents with   Follow-up    Asthma- SOB    HPI Tiffany Alexander is a 75 year old lifelong never smoker with chronic asthmatic bronchitis and mild pulmonary fibrosis.  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  She was last seen here on 05 December 2020.  This is a follow-up visit.  At that visit she noted that her shortness of breath had increased over her baseline.  A 2D echo and PFTs were ordered.  2D echo showed LVEF of 60 to 65%, grade 1 DD and normal right ventricular function.  Evidence of pulmonary hypertension.  There was aortic sclerosis without stenosis.  PFTs showed FEV1 of 1.89 L or 97% predicted no evidence of obstruction lung volumes normal flow volume loops with perhaps some vocal cord dysfunction and normal diffusion capacity.  Overall she does not have any deterioration of her lung function.  She notes today that she feels better than on her prior visit however she continues to have dyspnea out of proportion to her PFTs.  This this shortness of breath more as fatigue.  She continues to be compliant with Brovana and Pulmicort these continue to be effective for her.  She has significant issues with akathisia that are chronic.  Flow-volume loop on PFTs that show potential vocal cord dysfunction issues.  She has difficulty with sleep due to back pain, has poor restorative sleep due to this.  DATA 07/08/17 PFTs: FVC 2.61 L, 98% predicted.  FEV1 2.17 L, 106% predicted.  FEV1 ratio 83%.  TLC 77% predicted.  DLCO 71% predicted.  DLCO/VA 125% predicted 08/03/17 HRCT chest: Very mild nonspecific interstitial changes with basilar predominance.  Multiple tiny pulmonary nodules scattered throughout the lungs bilaterally measuring 5 mm or less in size, nonspecific but statistically likely benign.  01/04/18 PFTs: FVC: 2.56 > 2.63 L (97 > 99 %pred), FEV1:  2.08 > 2.13 L (102 > 105 %pred), FEV1/FVC: 81%, TLC: 4.29 L (87 %pred), DLCO 87 %pred 11/08/2018: [place] allergen profile was negative, total IgE not done.  CBC with differential showed no eosinophilia. 08/24/2019 PFTs: FEV1 1.88 L or 93% predicted, FEV1/FVC 86%.  No bronchodilator response. Volumes normal with the exception of low ERV consistent with obesity.  Patient capacity normal. Overall stable study when compared to previous. 12/19/2020 PFTs: FEV1 1.89 L or 97% predicted, FVC 2.28 L or 87% predicted, FEV1/FVC 74%, no bronchodilator response.  Lung volumes normal, ERV low consistent with obesity.  Diffusion capacity normal.  Overall stable study. 01/09/2021 echocardiogram: LVEF 60 to 65% grade 1 DD, normal right ventricular function, no pulmonary hypertension.  Aortic sclerosis without stenosis.  Review of Systems A 10 point review of systems was performed and it is as noted above otherwise negative.  Patient Active Problem List   Diagnosis Date Noted   Lymphedema 09/25/2020   Chronic venous insufficiency 09/25/2020   Recurrent urinary tract infection 11/25/2017   Coronary artery disease involving native coronary artery of native heart 08/24/2017   Hyperlipidemia, mixed 08/24/2017   SOBOE (shortness of breath on exertion) 08/24/2017   Positive ANA (antinuclear antibody) 08/20/2017   Pulmonary fibrosis ([place]) 08/20/2017   Status post finger joint fusion 07/16/2014   Allergic rhinitis 05/01/2013   Diabetes ([place]) 05/01/2013   HTN (hypertension) 05/01/2013   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   Lower urinary tract infectious disease 04/05/2013   Prolapse of  female pelvic organs 11/04/2012   Urge incontinence 06/27/2012   Chronic cystitis 01/05/2012   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/05/2012   Incomplete emptying of bladder 01/05/2012   Social History   Tobacco Use   Smoking status: Never   Smokeless tobacco: Never  Substance Use Topics   Alcohol use: No   Allergies  Allergen  Reactions   Codeine Itching   Erythromycin Other (See Comments)    Stomach cramps   Nitrofurantoin Other (See Comments)    Scarring of lung per pt   Penicillins Rash    Has patient had a PCN reaction causing immediate rash, facial/tongue/throat swelling, SOB or lightheadedness with hypotension: Yes Has patient had a PCN reaction causing severe rash involving mucus membranes or skin necrosis: Unknown Has patient had a PCN reaction that required hospitalization No Has patient had a PCN reaction occurring within the last 10 years: No If all of the above answers are "NO", then may proceed with Cephalosporin use.    Current Meds  Medication Sig   albuterol (VENTOLIN HFA) 108 (90 Base) MCG/ACT inhaler 2 puffs Q4H PRN   amLODipine (NORVASC) 10 MG tablet Take 10 mg by mouth at bedtime.   ascorbic acid (VITAMIN C) 500 MG tablet Take 500 mg by mouth daily.   atenolol (TENORMIN) 100 MG tablet Take 100 mg by mouth daily.   budesonide (PULMICORT) 0.25 MG/2ML nebulizer solution Take 2 mLs (0.25 mg total) by nebulization 2 (two) times daily.   calcium citrate-vitamin D (CITRACAL+D) 315-200 MG-UNIT tablet Take 1 tablet by mouth 2 (two) times daily.   cephALEXin (KEFLEX) 250 MG capsule Take 1 capsule (250 mg total) by mouth daily.   Cholecalciferol (VITAMIN D3) 1000 units CAPS Take by mouth daily.   citalopram (CELEXA) 20 MG tablet Take by mouth.   clotrimazole-betamethasone (LOTRISONE) cream Apply 1 application topically 2 (two) times daily.   Cyanocobalamin (VITAMIN B 12 PO) Take 1 tablet by mouth daily.   diphenhydrAMINE (BENADRYL) 50 MG tablet Take 75 mg by mouth daily as needed for itching.   etodolac (LODINE) 400 MG tablet Take 400 mg by mouth 2 (two) times daily as needed for mild pain.   fexofenadine (ALLEGRA) 180 MG tablet Take 180 mg by mouth daily.   fluticasone (FLONASE) 50 MCG/ACT nasal spray Place 2 sprays into both nostrils daily.   furosemide (LASIX) 20 MG tablet Take 20 mg by mouth  daily.   glipiZIDE (GLUCOTROL XL) 5 MG 24 hr tablet Take 5 mg by mouth daily with breakfast.   hydrOXYzine (ATARAX/VISTARIL) 10 MG tablet TAKE ONE TABLET BY MOUTH EVERY 8 HOURS AS NEEDED FOR ITCHING   ipratropium-albuterol (DUONEB) 0.5-2.5 (3) MG/3ML SOLN Take 3 mLs by nebulization every 6 (six) hours as needed. DX: j84.10   losartan (COZAAR) 100 MG tablet Take 100 mg by mouth at bedtime. Reported on 05/14/2015   Melatonin 10 MG CAPS Take 20 mg by mouth at bedtime as needed.   Multiple Vitamins-Minerals (WOMENS MULTIVITAMIN PO) Take 1 tablet by mouth daily at 8 pm.   omeprazole (PRILOSEC) 40 MG capsule TAKE ONE CAPSULE BY MOUTH AT BEDTIME   PERFOROMIST 20 MCG/2ML nebulizer solution USE 1 VIAL VIA NEBULIZER AND INHALE BY MOUTH 2 TIMES A DAY   Probiotic Product (PROBIOTIC BLEND PO) Take by mouth daily.   traMADol (ULTRAM) 50 MG tablet    traZODone (DESYREL) 150 MG tablet Take by mouth.   triamcinolone (KENALOG) 0.1 % Apply to feet and ankles twice a day x 2 weeks  then decrease to weekends only prn   Immunization History  Administered Date(s) Administered   Influenza, High Dose Seasonal PF 12/25/2014, 12/21/2016, 11/25/2017, 12/17/2018   PFIZER Comirnaty(Gray Top)Covid-19 Tri-Sucrose Vaccine 03/27/2019, 04/17/2019   PFIZER(Purple Top)SARS-COV-2 Vaccination 03/27/2019, 04/17/2019   Pneumococcal Conjugate-13 12/25/2014   Pneumococcal Polysaccharide-23 08/17/2017       Objective:   Physical Exam BP 130/66 (BP Location: Left Arm, Patient Position: Sitting, Cuff Size: Normal)   Pulse 73   Temp (!) 96.9 F (36.1 C) (Oral)   Ht 5\' 1"  (1.549 m)   Wt 209 lb 12.8 oz (95.2 kg)   SpO2 96%   BMI 39.64 kg/m  GENERAL: Awake, alert, fully ambulatory, no respiratory distress. HEAD: Normocephalic, atraumatic.  EYES: Pupils equal, round, reactive to light.  No scleral icterus.  MOUTH: Nose/mouth/throat not examined due to masking requirements for COVID 19. NECK: Supple. No thyromegaly. Trachea  midline. No JVD.  No adenopathy. PULMONARY: Excellent air entry bilaterally.  Lungs clear to auscultation bilaterally. CARDIOVASCULAR: S1 and S2. Regular rate and rhythm.  No rubs, murmurs or gallops heard. GASTROINTESTINAL: Obese abdomen, soft, nondistended. MUSCULOSKELETAL: No joint deformity, no clubbing, no edema.  NEUROLOGIC: No overt focal deficit.  Akathisia noted.  No tremor.  Speech is fluent. SKIN: Intact,warm,dry.  On limited exam no rashes. PSYCH: Mood and behavior normal        Assessment & Plan:     ICD-10-CM   1. Moderate persistent asthma without complication  E08.14    Continue Brovana and Pulmicort Continue as needed albuterol    2. Shortness of breath  R06.02 Pulse oximetry, overnight   Overnight oximetry    3. Very mild pulmonary fibrosis (HCC)  J84.10 Pulse oximetry, overnight    CANCELED: Pulse oximetry, overnight   Pulmonary function stable Likely due to prior nitrofurantoin exposure    4. Other fatigue  R53.83 TSH + free T4    CANCELED: TSH + free T4   Check thyroid studies      Orders Placed This Encounter  Procedures   TSH + free T4   Pulse oximetry, overnight    Standing Status:   Future    Standing Expiration Date:   01/27/2022    Scheduling Instructions:     ON RA    Patient's symptoms are out of proportion to her pulmonary data.  Query and element of debility and deconditioning.  Check studies as above.  We will see the patient in follow-up in time she is to call sooner should any new problems arise.  [name], MD Advanced Bronchoscopy PCCM [facility]    *This note was dictated using voice recognition software/Dragon.  Despite best efforts to proofread, errors can occur which can change the meaning. Any transcriptional errors that result from this process are unintentional and may not be fully corrected at the time of dictation.

## 2021-01-30 ENCOUNTER — Encounter: Payer: Self-pay | Admitting: Pulmonary Disease

## 2021-02-03 DIAGNOSIS — R0602 Shortness of breath: Secondary | ICD-10-CM | POA: Diagnosis not present

## 2021-02-07 ENCOUNTER — Telehealth: Payer: Self-pay

## 2021-02-07 DIAGNOSIS — G4734 Idiopathic sleep related nonobstructive alveolar hypoventilation: Secondary | ICD-10-CM

## 2021-02-07 NOTE — Telephone Encounter (Signed)
Pt's ONO results, Per Dr Patsey Berthold pt qualifies for 2L of O2 at night. Patient in agreement to this, RX form from [place] signed and filled out and faxed.    Order also placed nothing further needed.

## 2021-02-10 ENCOUNTER — Telehealth: Payer: Self-pay | Admitting: Pulmonary Disease

## 2021-02-10 NOTE — Telephone Encounter (Signed)
Please see 02/07/2021 phone note.  Patient had additional questioning regarding oxygen and why is was needed. I have answered all questions.  Patient voiced her understanding and had no further questions. Nothing further needed.

## 2021-02-12 ENCOUNTER — Telehealth: Payer: Self-pay | Admitting: Pulmonary Disease

## 2021-02-12 NOTE — Telephone Encounter (Signed)
Spoke to [place] with [employer] who stated that oxygen is scheduled to be delivered today prior to 9:00p.  Patient is aware of above message and voiced her understanding. Nothing further needed.

## 2021-02-12 NOTE — Telephone Encounter (Signed)
Lm for patient.  

## 2021-02-13 DIAGNOSIS — M5416 Radiculopathy, lumbar region: Secondary | ICD-10-CM | POA: Diagnosis not present

## 2021-02-13 DIAGNOSIS — M48062 Spinal stenosis, lumbar region with neurogenic claudication: Secondary | ICD-10-CM | POA: Diagnosis not present

## 2021-03-18 DIAGNOSIS — R399 Unspecified symptoms and signs involving the genitourinary system: Secondary | ICD-10-CM | POA: Diagnosis not present

## 2021-03-18 DIAGNOSIS — J4541 Moderate persistent asthma with (acute) exacerbation: Secondary | ICD-10-CM | POA: Diagnosis not present

## 2021-03-18 DIAGNOSIS — N390 Urinary tract infection, site not specified: Secondary | ICD-10-CM | POA: Diagnosis not present

## 2021-03-18 DIAGNOSIS — R300 Dysuria: Secondary | ICD-10-CM | POA: Diagnosis not present

## 2021-03-27 ENCOUNTER — Other Ambulatory Visit: Payer: Self-pay | Admitting: Pulmonary Disease

## 2021-03-27 ENCOUNTER — Telehealth: Payer: Self-pay | Admitting: Pulmonary Disease

## 2021-03-27 MED ORDER — ALBUTEROL SULFATE HFA 108 (90 BASE) MCG/ACT IN AERS: INHALATION_SPRAY | RESPIRATORY_TRACT | 0 refills | Status: AC

## 2021-03-27 NOTE — Telephone Encounter (Signed)
Ventolin has been sent to preferred pharmacy.  Patient is aware and voiced her understanding.  Nothing further needed at this time

## 2021-03-31 ENCOUNTER — Encounter: Payer: Self-pay | Admitting: Pulmonary Disease

## 2021-03-31 ENCOUNTER — Other Ambulatory Visit: Payer: Self-pay

## 2021-03-31 ENCOUNTER — Ambulatory Visit: Payer: Medicare HMO | Admitting: Pulmonary Disease

## 2021-03-31 ENCOUNTER — Ambulatory Visit
Admission: RE | Admit: 2021-03-31 | Discharge: 2021-03-31 | Disposition: A | Discharge status: Home / Self Care | Payer: Medicare HMO | Source: Ambulatory Visit | Attending: Pulmonary Disease | Admitting: Pulmonary Disease

## 2021-03-31 VITALS — BP 136/80 | HR 56 | Temp 97.1°F | Ht 62.0 in | Wt 211.6 lb

## 2021-03-31 DIAGNOSIS — G259 Extrapyramidal and movement disorder, unspecified: Secondary | ICD-10-CM

## 2021-03-31 DIAGNOSIS — R0602 Shortness of breath: Secondary | ICD-10-CM

## 2021-03-31 DIAGNOSIS — I2510 Atherosclerotic heart disease of native coronary artery without angina pectoris: Secondary | ICD-10-CM | POA: Diagnosis not present

## 2021-03-31 DIAGNOSIS — I2584 Coronary atherosclerosis due to calcified coronary lesion: Secondary | ICD-10-CM

## 2021-03-31 DIAGNOSIS — R001 Bradycardia, unspecified: Secondary | ICD-10-CM | POA: Diagnosis not present

## 2021-03-31 DIAGNOSIS — J4541 Moderate persistent asthma with (acute) exacerbation: Secondary | ICD-10-CM

## 2021-03-31 DIAGNOSIS — J8410 Pulmonary fibrosis, unspecified: Secondary | ICD-10-CM | POA: Diagnosis not present

## 2021-03-31 MED ORDER — METHYLPREDNISOLONE 4 MG PO TBPK: ORAL_TABLET | ORAL | 0 refills | Status: DC

## 2021-03-31 MED ORDER — REVEFENACIN 175 MCG/3ML IN SOLN: 175.0000 ug | Freq: Every day | RESPIRATORY_TRACT | 0 refills | Status: DC

## 2021-03-31 NOTE — Patient Instructions (Addendum)
We are going to get a chest x-ray and EKG today  We are going to give you a trial of a medication called Maretta Bees which is once a day you can mix it with your Perforomist.    Continue using the Perforomist and budesonide twice a day.  We are referring you to the neurologist for evaluation there are muscle/movement to see if this is creating an impact on your breathing.  I will give you a prednisone taper.  We will see you in follow-up in 3 to 4 weeks time with either me or the nurse practitioner at that time.

## 2021-03-31 NOTE — Progress Notes (Signed)
Subjective:    Patient ID: Tiffany Alexander, female    DOB: 03-20-1945, 76 y.o.   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  Was last seen on 27 January 2021.  This is a scheduled visit.  She follows here for the issue of her asthmatic bronchitis and fatigue on exertion.  She has been complaining of increasing dyspnea since around September with no worsening on her pulmonary function and dyspnea/fatigue out of proportion to her lung function.  She is on Pulmicort and Perforomist (formoterol) via nebulizer as well as needed albuterol.  She notes that she hardly has to use her albuterol at all.  She tells me that sometimes she is noted by others to be "wheezing".  On her prior PFTs there is a suggestion of possible vocal cord dysfunction.  She has issues with akathisia which is chronic and this appears to be somewhat worse today.  She has not had any fevers, chills or sweats.  No increased cough or sputum production.  No hemoptysis.  DATA 07/08/17 PFTs: FVC 2.61 L, 98% predicted.  FEV1 2.17 L, 106% predicted.  FEV1 ratio 83%.  TLC 77% predicted.  DLCO 71% predicted.  DLCO/VA 125% predicted 08/03/17 HRCT chest: Very mild nonspecific interstitial changes with basilar predominance.  Multiple tiny pulmonary nodules scattered throughout the lungs bilaterally measuring 5 mm or less in size, nonspecific but statistically likely benign.  01/04/18 PFTs: FVC: 2.56 > 2.63 L (97 > 99 %pred), FEV1: 2.08 > 2.13 L (102 > 105 %pred), FEV1/FVC: 81%, TLC: 4.29 L (87 %pred), DLCO 87 %pred 11/08/2018: Eastern allergen profile was negative, total IgE not done.  CBC with differential showed no eosinophilia. 08/24/2019 PFTs: FEV1 1.88 L or 93% predicted, FEV1/FVC 86%.  No bronchodilator response. Volumes normal with the exception of low ERV  consistent with obesity.  Patient capacity normal. Overall stable study when compared to previous. 12/19/2020 PFTs: FEV1 1.89 L or 97% predicted, FVC 2.28 L or 87% predicted, FEV1/FVC 74%, no bronchodilator response.  Lung volumes normal, ERV low consistent with obesity.  Diffusion capacity normal.  Overall stable study. 12/27/2020 high resolution CT: Fibrotic changes that remain extremely mild at this time, multiple small stable 5 mm nodules.  Coronary calcifications 01/09/2021 echocardiogram: LVEF 60 to 65% grade 1 DD, normal right ventricular function, no pulmonary hypertension.  Aortic sclerosis without stenosis.  Review of Systems A 10 point review of systems was performed and it is as noted above otherwise negative.  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   Lower urinary tract infectious disease 04/05/2013   Prolapse of female pelvic organs 11/04/2012   Urge incontinence 06/27/2012   Chronic cystitis 01/05/2012   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/05/2012   Incomplete emptying of bladder 01/05/2012   Social History   Tobacco Use   Smoking status: Never  Smokeless tobacco: Never  Substance Use Topics   Alcohol use: No   Allergies  Allergen Reactions   Codeine Itching   Erythromycin Other (See Comments)    Stomach cramps   Nitrofurantoin Other (See Comments)    Scarring of lung per pt   Penicillins Rash    Has patient  had a PCN reaction causing immediate rash, facial/tongue/throat swelling, SOB or lightheadedness with hypotension: Yes Has patient had a PCN reaction causing severe rash involving mucus membranes or skin necrosis: Unknown Has patient had a PCN reaction that required hospitalization No Has patient had a PCN reaction occurring within the last 10 years: No If all of the above answers are "NO", then may proceed with Cephalosporin use.    Current Meds  Medication Sig   albuterol (VENTOLIN HFA) 108 (90 Base) MCG/ACT inhaler 2 puffs Q4H PRN   amLODipine (NORVASC) 10 MG tablet Take 10 mg by mouth at bedtime.   ascorbic acid (VITAMIN C) 500 MG tablet Take 500 mg by mouth daily.   atenolol (TENORMIN) 100 MG tablet Take 100 mg by mouth daily.   budesonide (PULMICORT) 0.25 MG/2ML nebulizer solution Take 2 mLs (0.25 mg total) by nebulization 2 (two) times daily.   calcium citrate-vitamin D (CITRACAL+D) 315-200 MG-UNIT tablet Take 1 tablet by mouth 2 (two) times daily.   cephALEXin (KEFLEX) 250 MG capsule Take 1 capsule (250 mg total) by mouth daily.   Cholecalciferol (VITAMIN D3) 1000 units CAPS Take by mouth daily.   citalopram (CELEXA) 20 MG tablet Take by mouth.   clotrimazole-betamethasone (LOTRISONE) cream Apply 1 application topically 2 (two) times daily.   Cyanocobalamin (VITAMIN B 12 PO) Take 1 tablet by mouth daily.   diphenhydrAMINE (BENADRYL) 50 MG tablet Take 75 mg by mouth daily as needed for itching.   etodolac (LODINE) 400 MG tablet Take 400 mg by mouth 2 (two) times daily as needed for mild pain.   fexofenadine (ALLEGRA) 180 MG tablet Take 180 mg by mouth daily.   fluticasone (FLONASE) 50 MCG/ACT nasal spray Place 2 sprays into both nostrils daily.   furosemide (LASIX) 20 MG tablet Take 20 mg by mouth daily.   glipiZIDE (GLUCOTROL XL) 5 MG 24 hr tablet Take 5 mg by mouth daily with breakfast.   hydrOXYzine (ATARAX/VISTARIL) 10 MG tablet TAKE ONE TABLET BY MOUTH EVERY 8 HOURS AS NEEDED  FOR ITCHING   ipratropium-albuterol (DUONEB) 0.5-2.5 (3) MG/3ML SOLN Take 3 mLs by nebulization every 6 (six) hours as needed. DX: j84.10   losartan (COZAAR) 100 MG tablet Take 100 mg by mouth at bedtime. Reported on 05/14/2015   Melatonin 10 MG CAPS Take 20 mg by mouth at bedtime as needed.   Multiple Vitamins-Minerals (WOMENS MULTIVITAMIN PO) Take 1 tablet by mouth daily at 8 pm.   omeprazole (PRILOSEC) 40 MG capsule TAKE ONE CAPSULE BY MOUTH AT BEDTIME   PERFOROMIST 20 MCG/2ML nebulizer solution USE 1 VIAL VIA NEBULIZER AND INHALE BY MOUTH 2 TIMES A DAY   Probiotic Product (PROBIOTIC BLEND PO) Take by mouth daily.   traMADol (ULTRAM) 50 MG tablet    traZODone (DESYREL) 150 MG tablet Take by mouth.   triamcinolone (KENALOG) 0.1 % Apply to feet and ankles twice a day x 2 weeks then decrease to weekends only prn   Immunization History  Administered Date(s) Administered   Influenza, High Dose Seasonal PF 12/25/2014, 12/21/2016, 11/25/2017, 12/17/2018   PFIZER Comirnaty(Gray Top)Covid-19 Tri-Sucrose Vaccine 03/27/2019, 04/17/2019   PFIZER(Purple Top)SARS-COV-2 Vaccination 03/27/2019, 04/17/2019  Pneumococcal Conjugate-13 12/25/2014   Pneumococcal Polysaccharide-23 08/17/2017       Objective:   Physical Exam BP 136/80 (BP Location: Left Arm, Patient Position: Sitting, Cuff Size: Normal)    Pulse (!) 56    Temp (!) 97.1 F (36.2 C) (Oral)    Ht 5\' 2"  (1.575 m)    Wt 211 lb 9.6 oz (96 kg)    SpO2 94%    BMI 38.70 kg/m  GENERAL: Awake, alert, fully ambulatory, no respiratory distress.  Akathisia noted.  She presents in transport chair due to complaints of dyspnea and back pain. HEAD: Normocephalic, atraumatic.  EYES: Pupils equal, round, reactive to light.  No scleral icterus.  MOUTH: Nose/mouth/throat not examined due to masking requirements for COVID 19. NECK: Supple. No thyromegaly. Trachea midline. No JVD.  No adenopathy.  Occasional pseudo wheeze noted, no true true stridor. PULMONARY:  Excellent air entry bilaterally.  Lungs clear to auscultation bilaterally.  No true wheezes. CARDIOVASCULAR: S1 and S2. Regular rate and rhythm.  No rubs, murmurs or gallops heard. GASTROINTESTINAL: Obese abdomen, soft, nondistended. MUSCULOSKELETAL: No joint deformity, no clubbing, no edema.  NEUROLOGIC: No overt focal deficit.  Involuntary athetoid type movements.  No tremor.  Speech is fluent. SKIN: Intact,warm,dry.  On limited exam no rashes. PSYCH: Mood and behavior normal  Ambulatory oximetry was performed by MD: Oxygen saturations 96 to 97% remained so even with ambulation.  Of note patient has significant bradycardia and pulse did not compensate to activity.  Remained that 50 to 60 bpm.  Chest x-ray performed today, no acute disease on my review, chronic interstitial prominence without change:     Assessment & Plan:     ICD-10-CM   1. Moderate persistent asthma with acute exacerbation  J45.41    No evidence of infection at present Exacerbation appears very mild, if present Medrol Dosepak    2. SOB (shortness of breath)  R06.02 DG Chest 2 View   Out of proportion to pulmonary function Trial of Yupelri, samples provided to the patient Continue Perforomist/Pulmicort    3. Movement disorder  G25.9 Ambulatory referral to Neurology   Chronic akathisia Appears worse Query associated vocal cord dysfunction Referral to neurology    4. Coronary artery calcification  I25.10    I25.84    Noted on CT of October Query significant CAD Referral to cardiology vis--vis dyspnea    5. Bradycardia  R00.1 Ambulatory referral to Cardiology   On ambulation patient with significant bradycardia Blunted response with exercise Referral to cardiology [facility]ers to [facility]    6. Very mild pulmonary fibrosis ([place])  J84.10    By recent PFTs stable By chest x-ray stable Likely due to prior nitrofurantoin therapy      Orders Placed This Encounter  Procedures   DG Chest 2 View    Standing  Status:   Future    Number of Occurrences:   1    Standing Expiration Date:   03/31/2022    Order Specific Question:   Reason for Exam (SYMPTOM  OR DIAGNOSIS REQUIRED)    Answer:   Sob    Order Specific Question:   Preferred imaging location?    Answer:   [place]   Ambulatory referral to Neurology    Referral Priority:   Routine    Referral Type:   Consultation    Referral Reason:   Specialty Services Required    Referred to Provider:   Vladimir Crofts, MD    Requested Specialty:   Neurology  Number of Visits Requested:   1   Ambulatory referral to Cardiology    Referral Priority:   Routine    Referral Type:   Consultation    Referral Reason:   Specialty Services Required    Referred to Provider:   Minna Merritts, MD    Requested Specialty:   Cardiology    Number of Visits Requested:   1   Meds ordered this encounter  Medications   revefenacin (YUPELRI) 175 MCG/3ML nebulizer solution    Sig: Take 3 mLs (175 mcg total) by nebulization daily.    Dispense:  90 mL    Refill:  0    Order Specific Question:   Lot Number?    Answer:   [id number]    Order Specific Question:   Expiration Date?    Answer:   06/07/2022    Order Specific Question:   NDC    Answer:   09811-914-78 [[id number]]    Order Specific Question:   Quantity    Answer:   4   methylPREDNISolone (MEDROL DOSEPAK) 4 MG TBPK tablet    Sig: Take as directed in the package, this is a taper pack.    Dispense:  21 tablet    Refill:  0   The patient's dyspnea is out of proportion to her pulmonary function which is stable overall from baseline.  She continues to complain bitterly of fatigue and dyspnea.  Her akathisia seems to be more marked.  Query if this may be a side effect of her citalopram.  Wonder if her "wheezing" noted by others is related more to vocal cord dysfunction associated with her akathisia.  Have refer her to neurology for evaluation of this issue.  We will give her a challenge with a Medrol Dosepak.   Added Yupelri to her nebulizer.  However need to evaluate for potential nonpulmonary causes of dyspnea in particular cardiac given coronary artery calcifications noted on CT and blunted response to exercise with bradycardia.  Patient appears to be on a high dose of beta-blocker will have cardiology evaluate to address these issues.  The patient in follow-up in 3 to 4 weeks time she will see either me or the nurse practitioner at that time she is to contact us prior to that time should any new difficulties arise.   [name], MD [facility]    *This note was dictated using voice recognition software/Dragon.  Despite best efforts to proofread, errors can occur which can change the meaning. Any transcriptional errors that result from this process are unintentional and may not be fully corrected at the time of dictation.

## 2021-04-03 ENCOUNTER — Encounter: Payer: Self-pay | Admitting: Pulmonary Disease

## 2021-04-07 DIAGNOSIS — E1159 Type 2 diabetes mellitus with other circulatory complications: Secondary | ICD-10-CM | POA: Diagnosis not present

## 2021-04-07 DIAGNOSIS — D649 Anemia, unspecified: Secondary | ICD-10-CM | POA: Diagnosis not present

## 2021-04-08 DIAGNOSIS — R0602 Shortness of breath: Secondary | ICD-10-CM | POA: Diagnosis not present

## 2021-04-08 DIAGNOSIS — J8410 Pulmonary fibrosis, unspecified: Secondary | ICD-10-CM | POA: Diagnosis not present

## 2021-04-08 DIAGNOSIS — I10 Essential (primary) hypertension: Secondary | ICD-10-CM | POA: Diagnosis not present

## 2021-04-08 DIAGNOSIS — R001 Bradycardia, unspecified: Secondary | ICD-10-CM | POA: Diagnosis not present

## 2021-04-08 DIAGNOSIS — I2510 Atherosclerotic heart disease of native coronary artery without angina pectoris: Secondary | ICD-10-CM | POA: Diagnosis not present

## 2021-04-14 NOTE — Progress Notes (Signed)
Cardiology Office Note  Date:  04/16/2021   ID:  Velina, Drollinger [birth year], MRN [id number]  PCP:  Tiffany Pink, MD   Chief Complaint  Patient presents with   New Patient (Initial Visit)    Ref by Dr. Patsey Alexander for bradycardia. Patient c/o shortness of breath and fatigue and weakness. Medications reviewed by the patient verbally.     HPI:  [name] is a 76 year old woman with past medical history of asthma Hypertension Diabetes Coronary calcification on CT scan October 2022 mild pulmonary fibrosis Movement disorder, Morbid obesity Who presents by referral from Dr. Patsey Alexander for bradycardia, shortness of breath, coronary calcification  Recently seen by pulmonary, heart rate 56 bpm Concerned that heart rate was contributing to shortness of breath On atenolol 100 mg daily  Daughter presents with her today, reports shortness of breath has been worse over the past month or 2 Unable to exclude a viral syndrome Has been treated with steroids, continues to have difficulty  No regular exercise program, very limited Does not check blood pressure at home Blood pressure has not been low, denies orthostasis symptoms  Lab work reviewed A1C 6.8  EKG personally reviewed by myself on todays visit Normal sinus rhythm rate 59 bpm left axis deviation no significant ST-T wave changes  PMH:   has a past medical history of Arthritis, Carpal tunnel syndrome of left wrist, Depression, Diabetes mellitus without complication ([place]), Diverticulosis, GERD (gastroesophageal reflux disease), Hypertension, PONV (postoperative nausea and vomiting), PONV (postoperative nausea and vomiting), Pulmonary fibrosis ([place]), Recurrent UTI, SUI (stress urinary incontinence, female), and Wears dentures.  PSH:    Past Surgical History:  Procedure Laterality Date    endocervical curettage     ABDOMINAL HYSTERECTOMY     BLADDER SUSPENSION     CARPAL TUNNEL RELEASE Left    CARPAL TUNNEL RELEASE Right  05/14/2015   Procedure: CARPAL TUNNEL RELEASE;  Surgeon: Hessie Knows, MD;  Location: [facility] ORS;  Service: Orthopedics;  Laterality: Right;   CATARACT EXTRACTION W/PHACO Right 12/17/2015   Procedure: CATARACT EXTRACTION PHACO AND INTRAOCULAR LENS PLACEMENT (IOC);  Surgeon: Birder Robson, MD;  Location: [facility] ORS;  Service: Ophthalmology;  Laterality: Right;  [place] 52.0 AP% 15.4 CDE 7.99 Fluid pack lot # [id number]   CATARACT EXTRACTION W/PHACO Left 01/21/2016   Procedure: CATARACT EXTRACTION PHACO AND INTRAOCULAR LENS PLACEMENT (IOC);  Surgeon: Birder Robson, MD;  Location: [facility];  Service: Ophthalmology;  Laterality: Left;  [place] 50.4 AP% 19.6 CDE 9.92 FLUID PACK LOT # [id number]   CESAREAN SECTION N/A    COLONOSCOPY N/A 08/24/2014   Procedure: COLONOSCOPY;  Surgeon: Lollie Sails, MD;  Location: [facility] ENDOSCOPY;  Service: Endoscopy;  Laterality: N/A;   COLONOSCOPY N/A 03/13/2020   Procedure: COLONOSCOPY;  Surgeon: Toledo, Benay Pike, MD;  Location: [facility] ENDOSCOPY;  Service: Gastroenterology;  Laterality: N/A;   COLONOSCOPY WITH PROPOFOL N/A 04/05/2015   Procedure: COLONOSCOPY WITH PROPOFOL;  Surgeon: Lollie Sails, MD;  Location: [facility] ENDOSCOPY;  Service: Endoscopy;  Laterality: N/A;   COLPORRHAPHY     EYE SURGERY     FINGER ARTHRODESIS Right 07/12/2014   Procedure: Right middle finger DIP fussion;  Surgeon: Hessie Knows, MD;  Location: [facility] ORS;  Service: Orthopedics;  Laterality: Right;   FRACTURE SURGERY     fractured wrist Right    HAMMER TOE SURGERY Left 04/01/2016   Procedure: HAMMER TOE CORRECTION  Left 2nd  & 3rd toe;  Surgeon: [name], DPM;  Location: [place];  Service: Podiatry;  Laterality: Left;  Special:  Hammer lock implants Left 2nd & 3rd IVA LOcal Diabetic - oral meds   OPEN REDUCTION INTERNAL FIXATION (ORIF) METACARPAL Left 05/14/2015   Procedure: OPEN REDUCTION INTERNAL FIXATION (ORIF) METACARPAL;  Surgeon: Hessie Knows, MD;  Location: [facility] ORS;  Service:  Orthopedics;  Laterality: Left;   SEPTOPLASTY     TUBAL LIGATION      Current Outpatient Medications  Medication Sig Dispense Refill   albuterol (VENTOLIN HFA) 108 (90 Base) MCG/ACT inhaler 2 puffs Q4H PRN 18 g 0   amLODipine (NORVASC) 10 MG tablet Take 10 mg by mouth at bedtime.     ascorbic acid (VITAMIN C) 500 MG tablet Take 500 mg by mouth daily.     budesonide (PULMICORT) 0.25 MG/2ML nebulizer solution Take 2 mLs (0.25 mg total) by nebulization 2 (two) times daily. 120 mL 6   calcium citrate-vitamin D (CITRACAL+D) 315-200 MG-UNIT tablet Take 1 tablet by mouth 2 (two) times daily.     Cholecalciferol (VITAMIN D3) 1000 units CAPS Take by mouth daily.     citalopram (CELEXA) 20 MG tablet Take by mouth.     clotrimazole-betamethasone (LOTRISONE) cream Apply 1 application topically 2 (two) times daily.     Cyanocobalamin (VITAMIN B 12 PO) Take 1 tablet by mouth daily.     diphenhydrAMINE (BENADRYL) 50 MG tablet Take 75 mg by mouth daily as needed for itching.     doxycycline (VIBRAMYCIN) 100 MG capsule Take 100 mg by mouth 2 (two) times daily.     etodolac (LODINE) 400 MG tablet Take 400 mg by mouth 2 (two) times daily as needed for mild pain.     fexofenadine (ALLEGRA) 180 MG tablet Take 180 mg by mouth daily.     fluticasone (FLONASE) 50 MCG/ACT nasal spray Place 2 sprays into both nostrils daily.     furosemide (LASIX) 20 MG tablet Take 20 mg by mouth daily.     glipiZIDE (GLUCOTROL XL) 5 MG 24 hr tablet Take 5 mg by mouth daily with breakfast.     hydrALAZINE (APRESOLINE) 50 MG tablet Take 1 tablet (50 mg total) by mouth 3 (three) times daily. 90 tablet 6   hydrOXYzine (ATARAX/VISTARIL) 10 MG tablet TAKE ONE TABLET BY MOUTH EVERY 8 HOURS AS NEEDED FOR ITCHING     losartan (COZAAR) 100 MG tablet Take 100 mg by mouth at bedtime. Reported on 05/14/2015     Melatonin 10 MG CAPS Take 20 mg by mouth at bedtime as needed.     methylPREDNISolone (MEDROL DOSEPAK) 4 MG TBPK tablet Take as directed  in the package, this is a taper pack. 21 tablet 0   Multiple Vitamins-Minerals (WOMENS MULTIVITAMIN PO) Take 1 tablet by mouth daily at 8 pm.     omeprazole (PRILOSEC) 40 MG capsule TAKE ONE CAPSULE BY MOUTH AT BEDTIME 30 capsule 2   PERFOROMIST 20 MCG/2ML nebulizer solution USE 1 VIAL VIA NEBULIZER AND INHALE BY MOUTH 2 TIMES A DAY 120 mL 11   Probiotic Product (PROBIOTIC BLEND PO) Take by mouth daily.     revefenacin (YUPELRI) 175 MCG/3ML nebulizer solution Take 3 mLs (175 mcg total) by nebulization daily. 90 mL 0   rosuvastatin (CRESTOR) 5 MG tablet Take 5 mg by mouth at bedtime.     traMADol (ULTRAM) 50 MG tablet      traZODone (DESYREL) 150 MG tablet Take by mouth.     triamcinolone (KENALOG) 0.1 % Apply to feet and ankles twice a day x 2 weeks  then decrease to weekends only prn 80 g 2   cephALEXin (KEFLEX) 250 MG capsule Take 1 capsule (250 mg total) by mouth daily. (Patient not taking: Reported on 04/15/2021) 30 capsule 11   No current facility-administered medications for this visit.   Facility-Administered Medications Ordered in Other Visits  Medication Dose Route Frequency Provider Last Rate Last Admin   albuterol (PROVENTIL) (2.5 MG/3ML) 0.083% nebulizer solution 2.5 mg  2.5 mg Nebulization Once Wilhelmina Mcardle, MD        Allergies:   Codeine, Erythromycin, Nitrofurantoin, and Penicillins   Social History:  The patient  reports that she has never smoked. She has never used smokeless tobacco. She reports that she does not drink alcohol and does not use drugs.   Family History:   family history includes Breast cancer in her maternal aunt; Breast cancer (age of onset: 66) in her paternal grandmother; Cancer in her father.    Review of Systems: Review of Systems  Constitutional: Negative.   HENT: Negative.    Respiratory:  Positive for shortness of breath.   Cardiovascular: Negative.   Gastrointestinal: Negative.   Musculoskeletal: Negative.   Neurological: Negative.    Psychiatric/Behavioral: Negative.    All other systems reviewed and are negative.   PHYSICAL EXAM: VS:  BP (!) 150/68 (BP Location: Right Arm, Patient Position: Sitting, Cuff Size: Normal)    Pulse (!) 59    Ht 5\' 2"  (1.575 m)    Wt 211 lb 6 oz (95.9 kg)    SpO2 98%    BMI 38.66 kg/m  , BMI Body mass index is 38.66 kg/m. GEN: Well nourished, well developed, in no acute distress, obese, mild respiratory distress HEENT: normal Neck: no JVD, carotid bruits, or masses Cardiac: RRR; no murmurs, rubs, or gallops,no edema  Respiratory:  clear with scattered wheezing GI: soft, nontender, nondistended, + BS MS: no deformity or atrophy Skin: warm and dry, no rash Neuro:  Strength and sensation are intact Psych: euthymic mood, full affect   Recent Labs: 01/27/2021: TSH 2.271    Lipid Panel No results found for: CHOL, HDL, LDLCALC, TRIG    Wt Readings from Last 3 Encounters:  04/15/21 211 lb 6 oz (95.9 kg)  03/31/21 211 lb 9.6 oz (96 kg)  01/27/21 209 lb 12.8 oz (95.2 kg)       ASSESSMENT AND PLAN:  Problem List Items Addressed This Visit       Cardiology Problems   Coronary artery disease involving native coronary artery of native heart - Primary   Relevant Medications   hydrALAZINE (APRESOLINE) 50 MG tablet   Other Relevant Orders   EKG 12-Lead   HTN (hypertension)   Relevant Medications   hydrALAZINE (APRESOLINE) 50 MG tablet   Hyperlipidemia, mixed   Relevant Medications   hydrALAZINE (APRESOLINE) 50 MG tablet     Other   Diabetes ([place])   Pulmonary fibrosis ([facility])   Relevant Orders   EKG 12-Lead   Other Visit Diagnoses     Precordial pain       Relevant Orders   CT CORONARY MORPH W/CTA COR W/SCORE W/CA W/CM &/OR WO/CM      Coronary disease with stable angina Coronary calcification was seen on CT scan chest Worsening shortness of breath past month or 2, very limited, unable to treadmill secondary to shortness of breath -Risk factors include diabetes,  hyperlipidemia -Given body habitus not a good candidate for Myoview Recommended cardiac CTA to rule out ischemia Orders placed, directions provided  for cardiac CTA, she is in agreement  Shortness of breath Asthma, fibrosis, followed by pulmonary Low heart rate likely not a contributor Work-up for ischemia as above Stay on current dose of Lasix Recent echocardiogram normal ejection fraction no indication of pulmonary hypertension, no valvular heart disease  Bradycardia Likely asymptomatic given concern for primary care and pulmonary concerning low heart rate we will stop her atenolol There is also concern that atenolol can cause bronchoconstriction, we will stop it for now  Essential hypertension To replace the atenolol we will start hydralazine 50 mg 3 times daily Blood pressure elevated in the office, recommend she monitor blood pressure at home  Diabetes type 2 We have encouraged continued careful diet management in an effort to lose weight.    Total encounter time more than 60 minutes  Greater than 50% was spent in counseling and coordination of care with the patient    Signed, Esmond Plants, M.D., Ph.D. [place], [place]

## 2021-04-15 ENCOUNTER — Ambulatory Visit: Payer: Medicare HMO | Admitting: Cardiovascular Disease

## 2021-04-15 ENCOUNTER — Encounter: Payer: Self-pay | Admitting: Cardiovascular Disease

## 2021-04-15 ENCOUNTER — Other Ambulatory Visit: Payer: Self-pay

## 2021-04-15 VITALS — BP 150/68 | HR 59 | Ht 62.0 in | Wt 211.4 lb

## 2021-04-15 DIAGNOSIS — I25118 Atherosclerotic heart disease of native coronary artery with other forms of angina pectoris: Secondary | ICD-10-CM

## 2021-04-15 DIAGNOSIS — E782 Mixed hyperlipidemia: Secondary | ICD-10-CM

## 2021-04-15 DIAGNOSIS — R072 Precordial pain: Secondary | ICD-10-CM

## 2021-04-15 DIAGNOSIS — E1359 Other specified diabetes mellitus with other circulatory complications: Secondary | ICD-10-CM

## 2021-04-15 DIAGNOSIS — I10 Essential (primary) hypertension: Secondary | ICD-10-CM | POA: Diagnosis not present

## 2021-04-15 DIAGNOSIS — J8410 Pulmonary fibrosis, unspecified: Secondary | ICD-10-CM

## 2021-04-15 MED ORDER — HYDRALAZINE HCL 50 MG PO TABS: 50.0000 mg | ORAL_TABLET | Freq: Three times a day (TID) | ORAL | 6 refills | Status: DC

## 2021-04-15 MED ORDER — METOPROLOL TARTRATE 25 MG PO TABS: ORAL_TABLET | ORAL | 0 refills | Status: DC

## 2021-04-15 NOTE — Patient Instructions (Addendum)
Medication Instructions:  - Your physician has recommended you make the following change in your medication:   1) STOP atenolol  2) START hydralazine 50 mg: - take 1 tablet by mouth THREE times a day   If you need a refill on your cardiac medications before your next appointment, please call your pharmacy.   Lab work: No new labs needed  Testing/Procedures: - Your physician has requested that you have cardiac CT. Cardiac computed tomography (CT) is a painless test that uses an x-ray machine to take clear, detailed pictures of your heart.    [facility] [address] [place], [place] [address] [phone]   [facility], please arrive 15 mins early for check-in and test prep.  Please follow these instructions carefully (unless otherwise directed):    On the Night Before the Test: Be sure to Drink plenty of water. Do not consume any caffeinated/decaffeinated beverages or chocolate 12 hours prior to your test. Do not take any antihistamines 12 hours prior to your test.   On the Day of the Test: Drink plenty of water until 1 hour prior to the test. Do not eat any food 4 hours prior to the test. You may take your regular medications prior to the test.  Take atenolol 100 mg the morning of your test prior  HOLD hydralazine all day the day of your test (you will resume the next day) HOLD Furosemide (Lasix) morning of the test. FEMALES- please wear underwire-free bra if available, avoid dresses & tight clothing   After the Test: Drink plenty of water. After receiving IV contrast, you may experience a mild flushed feeling. This is normal. On occasion, you may experience a mild rash up to 24 hours after the test. This is not dangerous. If this occurs, you can take Benadryl 25 mg and increase your fluid intake. If you experience trouble breathing, this can be serious. If it is severe call 911 IMMEDIATELY. If  it is mild, please call our office. If you take any of these medications: Glipizide/Metformin, Avandament, Glucavance, please do not take 48 hours after completing test unless otherwise instructed.  We will call to schedule your test 2-4 weeks out understanding that some insurance companies will need an authorization prior to the service being performed.   For non-scheduling related questions, please contact the cardiac imaging nurse navigator should you have any questions/concerns: Marchia Bond, Cardiac Imaging Nurse Navigator Gordy Clement, Cardiac Imaging Nurse Navigator [facility] Direct Office Dial: [phone]   For scheduling needs, including cancellations and rescheduling, please call [place], [phone].     Follow-Up: At [facility], you and your health needs are our priority.  As part of our continuing mission to provide you with exceptional heart care, we have created designated Provider Care Teams.  These Care Teams include your primary Cardiologist (physician) and Advanced Practice Providers (APPs -  Physician Assistants and Nurse Practitioners) who all work together to provide you with the care you need, when you need it.  You will need a follow up appointment as needed  Providers on your designated Care Team:   Murray Hodgkins, NP Christell Faith, PA-C Cadence Kathlen Mody, [place]  COVID-19 Vaccine Information can be found at: ShippingScam.co.uk For questions related to vaccine distribution or appointments, please email [email or web address] .[email or web address] or call [phone].     Cardiac CT Angiogram A cardiac CT angiogram is a procedure to look at the heart and the area around the heart. It  may be done to help find the cause of chest pains or other symptoms of heart disease. During this procedure, a substance called contrast dye is injected into the blood vessels in the area to be checked. A large  X-ray machine, called a CT scanner, then takes detailed pictures of the heart and the surrounding area. The procedure is also sometimes called a coronary CT angiogram, coronary artery scanning, or CTA. A cardiac CT angiogram allows the health care provider to see how well blood is flowing to and from the heart. The health care provider will be able to see if there are any problems, such as: Blockage or narrowing of the coronary arteries in the heart. Fluid around the heart. Signs of weakness or disease in the muscles, valves, and tissues of the heart. Tell a health care provider about: Any allergies you have. This is especially important if you have had a previous allergic reaction to contrast dye. All medicines you are taking, including vitamins, herbs, eye drops, creams, and over-the-counter medicines. Any blood disorders you have. Any surgeries you have had. Any medical conditions you have. Whether you are pregnant or may be pregnant. Any anxiety disorders, chronic pain, or other conditions you have that may increase your stress or prevent you from lying still. What are the risks? Generally, this is a safe procedure. However, problems may occur, including: Bleeding. Infection. Allergic reactions to medicines or dyes. Damage to other structures or organs. Kidney damage from the contrast dye that is used. Increased risk of cancer from radiation exposure. This risk is low. Talk with your health care provider about: The risks and benefits of testing. How you can receive the lowest dose of radiation. What happens before the procedure? Wear comfortable clothing and remove any jewelry, glasses, dentures, and hearing aids. Follow instructions from your health care provider about eating and drinking. This may include: For 12 hours before the procedure -- avoid caffeine. This includes tea, coffee, soda, energy drinks, and diet pills. Drink plenty of water or other fluids that do not have caffeine  in them. Being well hydrated can prevent complications. For 4-6 hours before the procedure -- stop eating and drinking. The contrast dye can cause nausea, but this is less likely if your stomach is empty. Ask your health care provider about changing or stopping your regular medicines. This is especially important if you are taking diabetes medicines, blood thinners, or medicines to treat problems with erections (erectile dysfunction). What happens during the procedure?  Hair on your chest may need to be removed so that small sticky patches called electrodes can be placed on your chest. These will transmit information that helps to monitor your heart during the procedure. An IV will be inserted into one of your veins. You might be given a medicine to control your heart rate during the procedure. This will help to ensure that good images are obtained. You will be asked to lie on an exam table. This table will slide in and out of the CT machine during the procedure. Contrast dye will be injected into the IV. You might feel warm, or you may get a metallic taste in your mouth. You will be given a medicine called nitroglycerin. This will relax or dilate the arteries in your heart. The table that you are lying on will move into the CT machine tunnel for the scan. The person running the machine will give you instructions while the scans are being done. You may be asked to: Keep your  arms above your head. Hold your breath. Stay very still, even if the table is moving. When the scanning is complete, you will be moved out of the machine. The IV will be removed. The procedure may vary among health care providers and hospitals. What can I expect after the procedure? After your procedure, it is common to have: A metallic taste in your mouth from the contrast dye. A feeling of warmth. A headache from the nitroglycerin. Follow these instructions at home: Take over-the-counter and prescription medicines only  as told by your health care provider. If you are told, drink enough fluid to keep your urine pale yellow. This will help to flush the contrast dye out of your body. Most people can return to their normal activities right after the procedure. Ask your health care provider what activities are safe for you. It is up to you to get the results of your procedure. Ask your health care provider, or the department that is doing the procedure, when your results will be ready. Keep all follow-up visits as told by your health care provider. This is important. Contact a health care provider if: You have any symptoms of allergy to the contrast dye. These include: Shortness of breath. Rash or hives. A racing heartbeat. Summary A cardiac CT angiogram is a procedure to look at the heart and the area around the heart. It may be done to help find the cause of chest pains or other symptoms of heart disease. During this procedure, a large X-ray machine, called a CT scanner, takes detailed pictures of the heart and the surrounding area after a contrast dye has been injected into blood vessels in the area. Ask your health care provider about changing or stopping your regular medicines before the procedure. This is especially important if you are taking diabetes medicines, blood thinners, or medicines to treat erectile dysfunction. If you are told, drink enough fluid to keep your urine pale yellow. This will help to flush the contrast dye out of your body. This information is not intended to replace advice given to you by your health care provider. Make sure you discuss any questions you have with your health care provider. Document Revised: 11/06/2020 Document Reviewed: 10/19/2018 Elsevier Patient Education  2022 [employer].

## 2021-04-22 ENCOUNTER — Telehealth (HOSPITAL_COMMUNITY): Payer: Self-pay | Admitting: *Deleted

## 2021-04-22 NOTE — Telephone Encounter (Signed)
Reaching out to patient to offer assistance regarding upcoming cardiac imaging study; pt verbalizes understanding of appt date/time, parking situation and where to check in, pre-test NPO status and medications ordered, and verified current allergies; name and call back number provided for further questions should they arise  Tiffany Clement RN Navigator Cardiac Imaging [facility] [phone] office [phone] cell  Patient to take 100mg  atenolol prior to her test.

## 2021-04-24 ENCOUNTER — Other Ambulatory Visit: Payer: Self-pay

## 2021-04-24 ENCOUNTER — Ambulatory Visit
Admission: RE | Admit: 2021-04-24 | Discharge: 2021-04-24 | Disposition: A | Discharge status: Home / Self Care | Payer: Medicare HMO | Source: Ambulatory Visit | Attending: Cardiovascular Disease | Admitting: Cardiovascular Disease

## 2021-04-24 ENCOUNTER — Other Ambulatory Visit: Payer: Self-pay | Admitting: Cardiovascular Disease

## 2021-04-24 DIAGNOSIS — R931 Abnormal findings on diagnostic imaging of heart and coronary circulation: Secondary | ICD-10-CM | POA: Insufficient documentation

## 2021-04-24 DIAGNOSIS — R072 Precordial pain: Secondary | ICD-10-CM | POA: Diagnosis not present

## 2021-04-24 DIAGNOSIS — I2510 Atherosclerotic heart disease of native coronary artery without angina pectoris: Secondary | ICD-10-CM | POA: Diagnosis not present

## 2021-04-24 MED ORDER — METOPROLOL TARTRATE 5 MG/5ML IV SOLN
10.0000 mg | Freq: Once | INTRAVENOUS | Status: DC
Start: 2021-04-24 — End: 2021-04-25

## 2021-04-24 MED ORDER — METOPROLOL TARTRATE 5 MG/5ML IV SOLN: 10.0000 mg | Freq: Once | INTRAVENOUS | Status: DC

## 2021-04-24 MED ORDER — METOPROLOL TARTRATE 100 MG PO TABS
100.0000 mg | ORAL_TABLET | ORAL | Status: AC
  Administered 2021-04-24: 100 mg via ORAL

## 2021-04-24 MED ORDER — IOHEXOL 350 MG/ML SOLN
100.0000 mL | Freq: Once | INTRAVENOUS | Status: AC | PRN
  Administered 2021-04-24: 100 mL via INTRAVENOUS

## 2021-04-24 MED ORDER — NITROGLYCERIN 0.4 MG SL SUBL
0.8000 mg | SUBLINGUAL_TABLET | Freq: Once | SUBLINGUAL | Status: AC
  Administered 2021-04-24: 0.8 mg via SUBLINGUAL

## 2021-04-24 MED ORDER — DILTIAZEM HCL 25 MG/5ML IV SOLN: 10.0000 mg | Freq: Once | INTRAVENOUS | Status: DC

## 2021-04-24 NOTE — Progress Notes (Signed)
Patient tolerated procedure well.  2L O2 [place] provided for patient comfort during procedure. Patient uses 2L [place] at night normally. Ambulate w/o difficulty. Denies light headedness or being dizzy. Sitting in chair drinking water provided. Encouraged to drink extra water today and reasoning explained. Verbalized understanding. All questions answered. ABC intact. No further needs. Discharge from procedure area w/o issues.

## 2021-04-25 DIAGNOSIS — R931 Abnormal findings on diagnostic imaging of heart and coronary circulation: Secondary | ICD-10-CM | POA: Diagnosis not present

## 2021-04-25 DIAGNOSIS — I2510 Atherosclerotic heart disease of native coronary artery without angina pectoris: Secondary | ICD-10-CM | POA: Diagnosis not present

## 2021-04-28 ENCOUNTER — Telehealth: Payer: Self-pay

## 2021-04-28 NOTE — Telephone Encounter (Signed)
Able to reach pt regarding her recent CCTA Dr. Rockey Situ had a chance to review her results and advised   "Cardiac CTA  There is concern for blockage of one of the coronary arteries, RCA  Unclear if this could be contributing to her shortness of breath   Shortness of breath could also be coming from  "bilateral chronic appearing peripheral  predominant interstitial reticulation which may reflect sequelae of  chronic interstitial lung disease."   We will defer whether she would like cardiac catheterization or medical management  She is welcome to come in and discuss in the clinic if she would like "  [name] very thankful for the phone call of her results, all questions and concerns were address, pt still reports SOB, HTN, and feeling fatigue, would like to come in to discuss her results and if heart cath is an option.  Schedule her this Thursday 2/23 at 08:00 am with Cadence Kathlen Mody, NP.

## 2021-04-30 NOTE — Progress Notes (Addendum)
Cardiology Office Note:    Date:  04/30/2021   ID:  Tiffany Alexander, DOB [birth year], MRN [id number]  PCP:  [place] Pink, MD  [facility] Providers Cardiologist: Dr. Ida Rogue, MD Click to update primary MD,subspecialty MD or APP then REFRESH:1}   Referring MD: [place] Pink, MD   Chief Complaint:  Follow up to discuss results of Cardiac CTA   Patient Profile: -Hypertension (Norvasc 10 mg) (losartan 100 mg) -Coronary artery disease (Crestor 5 mg) (hydralazine 50 mg) -Diabetes type 2 (glipizide 5 mg -Morbid Obesity -GERD -Pulmonary Fibrosis ( Lasix 20 mg)  Prior CV Studies: -Echo completed 11/22 EF 60 to 11% grade 1 diastolic dysfunction  -CT angiography with FFR 10/22  ( Coronary Calcifications)  History of Present Illness:   Tiffany Alexander is a 76 y.o. female with the above problem list.  She was last seen by Dr. Rockey Situ  on 04/15/2021 establishing as a new patient.  She has a past medical history of CAD,HTN, HLD, DM II, pulmonary fibrosis 5/19 followed by pulmonologist. She was seen by Dr. Nehemiah Massed at [facility] who performed an echo 7/19 with EF 55% LV function.  High-resolution CT completed 5/19 showing aortic sclerosis and three-vessel CAD.  Nuclear stress test performed 7/19 with no abnormal findings.  On 10/22 another high-resolution CT was completed due to increases SOB with findings similar to scan in 5/19 of calcified atherosclerotic plaque in the left main, left anterior descending, left circumflex and right coronary arteries.  There was also calcification of aortic valve and mitral annulas .    She was referred to Dr. Rockey Situ at [facility] for bradycardia (HR 56) and shortness of breath and increasing fatigue over last two months. Coronary CTA was ordered 2/17  with findings of severe stenosis of RCA greater than 70%, stenosis greater than 50% left main ,mild stenosis of left circumflex less than 25%, calcium score of 540. Her atenolol was stopped due to bradycardia and  potential of bronchoconstriction and hydralazine started with instruction to monitor BP at home.  She presents today to discuss findings of recent CTA and to determine treatment path of cardiac cath versus medical management.  She is accompanied by her daughter and husband.She is still endorsing shortness of breath  but denies chest pain. She reports that her heart rate is improved at 78 bpm, but blood pressure has been increasing since changing atenolol to hydralazine. She is anxious about next steps in the  treatment process.  Past Medical History:  Diagnosis Date   Arthritis    osteoarthritis   Carpal tunnel syndrome of left wrist    Depression    Diabetes mellitus without complication (HCC)    Diverticulosis    GERD (gastroesophageal reflux disease)    Hypertension    PONV (postoperative nausea and vomiting)    PONV (postoperative nausea and vomiting)    Pulmonary fibrosis (HCC)    Recurrent UTI    SUI (stress urinary incontinence, female)    Wears dentures    full upper and lower   Current Medications: No outpatient medications have been marked as taking for the 05/01/21 encounter (Appointment) with Kathlen Mody, Cadence H, PA-C.    Allergies:   Codeine, Erythromycin, Nitrofurantoin, and Penicillins   Social History   Tobacco Use   Smoking status: Never   Smokeless tobacco: Never  Vaping Use   Vaping Use: Never used  Substance Use Topics   Alcohol use: No   Drug use: No    Family Hx: The patient's  family history includes Breast cancer in her maternal aunt; Breast cancer (age of onset: 23) in her paternal grandmother; Cancer in her father.  Review of Systems  Cardiovascular:  Negative for chest pain and palpitations.  Respiratory:  Positive for shortness of breath. Negative for cough and wheezing.   All other systems reviewed and are negative.   EKGs/Labs/Other Test Reviewed:    EKG:  EKG is normal sinus rhythm rate of 78 with left axis deviation possible anterior infarct.     Recent Labs: 01/27/2021: TSH 2.271   Recent Lipid Panel No results for input(s): CHOL, TRIG, HDL, VLDL, LDLCALC, LDLDIRECT in the last 8760 hours.   Risk Assessment/Calculations:         Physical Exam:    VS: Today's Vitals   05/01/21 0756  BP: 120/60  Pulse: 78  SpO2: 97%  Weight: 205 lb (93 kg)  Height: 5\' 2"  (1.575 m)   Body mass index is 37.49 kg/m.   Wt Readings from Last 3 Encounters:  04/15/21 211 lb 6 oz (95.9 kg)  03/31/21 211 lb 9.6 oz (96 kg)  01/27/21 209 lb 12.8 oz (95.2 kg)    Vitals reviewed.  Constitutional:      Appearance: Healthy appearance. Not in distress.  HENT:     Head: Normocephalic.  Cardiovascular:     PMI at left midclavicular line. Normal rate. Regular rhythm. Normal S1. Normal S2.      Murmurs: There is no murmur.     No gallop.  No click. No rub.  Pulses:    Radial: 1+ bilaterally.    Dorsalis pedis: 1+ bilaterally. Edema:    Peripheral edema absent.        ASSESSMENT & PLAN:    1.Coronary artery disease: Recent coronary CTA with 3 vessel disease with greater than 70% RCA and 50% Left main.  She has decided to pursue cardiac catheterization at this time.  We discussed the risk and benefits of procedure with her daughter and husband present with all questions answered.  We will plan to have procedure scheduled for next week. -BMET and CBC today -Continue heart healthy low-sodium diet -Norvasc 10 mg, and losartan 100 mg  2.  Hypertension:  -Blood pressure today was 120/ 60,  and at home have been as high as 170's-180's with improved rate in the 70's.  -We will add coreg 6.25 and she will log blood pressures for the next week. -Continue hydralazine 50 mg, -Continue Norvasc 10 mg -Continue losartan 100 mg  3.  Pulmonary fibrosis: Managed by pulmonology  -Continue Lasix 20 mg  4.  Hyperlipidemia:  -Continue Crestor 5 mg  -last LDL 74 (11/21/20)  5.  Diabetes mellitus type 2:  -Last hemoglobin A1c 6.8, followed by  PCP -continue heart healthy low carb diet  Disposition: Follow-up in 2 to 3 weeks after cardiac catheterization with Dr. Rockey Situ or APP  Shared Decision Making/Informed Consent The risks [stroke (1 in 1000), death (1 in 55), kidney failure [usually temporary] (1 in 500), bleeding (1 in 200), allergic reaction [possibly serious] (1 in 200)], benefits (diagnostic support and management of coronary artery disease) and alternatives of a cardiac catheterization were discussed in detail with Ms. Deluna and she is willing to proceed.  Dispo:  No follow-ups on file.   Medication Adjustments/Labs and Tests Ordered: Current medicines are reviewed at length with the patient today.  Concerns regarding medicines are outlined above.  Tests Ordered: Left heart catheterization  Medication Changes: Carvedilol 6.25 mg twice daily  Signed, Mable Fill, Marissa Nestle, NP  04/30/2021 4:17 PM    [facility] [place], [place], [place]  [address] Phone: [phone]; Fax: [phone]

## 2021-04-30 NOTE — H&P (View-Only) (Signed)
Cardiology Office Note:    Date:  04/30/2021   ID:  Tiffany Alexander, DOB April 21, 1945, MRN [id number]  PCP:  Mary[place] Pink, MD  [facility] Providers Cardiologist: Dr. Ida Rogue, MD Click to update primary MD,subspecialty MD or APP then REFRESH:1}   Referring MD: Maryland Pink, MD   Chief Complaint:  Follow up to discuss results of Cardiac CTA   Patient Profile: -Hypertension (Norvasc 10 mg) (losartan 100 mg) -Coronary artery disease (Crestor 5 mg) (hydralazine 50 mg) -Diabetes type 2 (glipizide 5 mg -Morbid Obesity -GERD -Pulmonary Fibrosis ( Lasix 20 mg)  Prior CV Studies: -Echo completed 11/22 EF 60 to 85% grade 1 diastolic dysfunction  -CT angiography with FFR 10/22  ( Coronary Calcifications)  History of Present Illness:   Tiffany Alexander is a 76 y.o. female with the above problem list.  She was last seen by Dr. Rockey Situ  on 04/15/2021 establishing as a new patient.  She has a past medical history of CAD,HTN, HLD, DM II, pulmonary fibrosis 5/19 followed by pulmonologist. She was seen by Dr. Nehemiah Massed at [place] [facility] who performed an echo 7/19 with EF 55% LV function.  High-resolution CT completed 5/19 showing aortic sclerosis and three-vessel CAD.  Nuclear stress test performed 7/19 with no abnormal findings.  On 10/22 another high-resolution CT was completed due to increases SOB with findings similar to scan in 5/19 of calcified atherosclerotic plaque in the left main, left anterior descending, left circumflex and right coronary arteries.  There was also calcification of aortic valve and mitral annulas .    She was referred to Dr. Rockey Situ at [facility] for bradycardia (HR 56) and shortness of breath and increasing fatigue over last two months. Coronary CTA was ordered 2/17  with findings of severe stenosis of RCA greater than 70%, stenosis greater than 50% left main ,mild stenosis of left circumflex less than 25%, calcium score of 540. Her atenolol was stopped due to bradycardia and  potential of bronchoconstriction and hydralazine started with instruction to monitor BP at home.  She presents today to discuss findings of recent CTA and to determine treatment path of cardiac cath versus medical management.  She is accompanied by her daughter and husband.She is still endorsing shortness of breath  but denies chest pain. She reports that her heart rate is improved at 78 bpm, but blood pressure has been increasing since changing atenolol to hydralazine. She is anxious about next steps in the  treatment process.  Past Medical History:  Diagnosis Date   Arthritis    osteoarthritis   Carpal tunnel syndrome of left wrist    Depression    Diabetes mellitus without complication (HCC)    Diverticulosis    GERD (gastroesophageal reflux disease)    Hypertension    PONV (postoperative nausea and vomiting)    PONV (postoperative nausea and vomiting)    Pulmonary fibrosis (HCC)    Recurrent UTI    SUI (stress urinary incontinence, female)    Wears dentures    full upper and lower   Current Medications: No outpatient medications have been marked as taking for the 05/01/21 encounter (Appointment) with Kathlen Mody, Cadence H, PA-C.    Allergies:   Codeine, Erythromycin, Nitrofurantoin, and Penicillins   Social History   Tobacco Use   Smoking status: Never   Smokeless tobacco: Never  Vaping Use   Vaping Use: Never used  Substance Use Topics   Alcohol use: No   Drug use: No    Family Hx: The patient's  family history includes Breast cancer in her maternal aunt; Breast cancer (age of onset: 37) in her paternal grandmother; Cancer in her father.  Review of Systems  Cardiovascular:  Negative for chest pain and palpitations.  Respiratory:  Positive for shortness of breath. Negative for cough and wheezing.   All other systems reviewed and are negative.   EKGs/Labs/Other Test Reviewed:    EKG:  EKG is normal sinus rhythm rate of 78 with left axis deviation possible anterior infarct.     Recent Labs: 01/27/2021: TSH 2.271   Recent Lipid Panel No results for input(s): CHOL, TRIG, HDL, VLDL, LDLCALC, LDLDIRECT in the last 8760 hours.   Risk Assessment/Calculations:         Physical Exam:    VS: Today's Vitals   05/01/21 0756  BP: 120/60  Pulse: 78  SpO2: 97%  Weight: 205 lb (93 kg)  Height: 5\' 2"  (1.575 m)   Body mass index is 37.49 kg/m.   Wt Readings from Last 3 Encounters:  04/15/21 211 lb 6 oz (95.9 kg)  03/31/21 211 lb 9.6 oz (96 kg)  01/27/21 209 lb 12.8 oz (95.2 kg)    Vitals reviewed.  Constitutional:      Appearance: Healthy appearance. Not in distress.  HENT:     Head: Normocephalic.  Cardiovascular:     PMI at left midclavicular line. Normal rate. Regular rhythm. Normal S1. Normal S2.      Murmurs: There is no murmur.     No gallop.  No click. No rub.  Pulses:    Radial: 1+ bilaterally.    Dorsalis pedis: 1+ bilaterally. Edema:    Peripheral edema absent.        ASSESSMENT & PLAN:    1.Coronary artery disease: Recent coronary CTA with 3 vessel disease with greater than 70% RCA and 50% Left main.  She has decided to pursue cardiac catheterization at this time.  We discussed the risk and benefits of procedure with her daughter and husband present with all questions answered.  We will plan to have procedure scheduled for next week. -BMET and CBC today -Continue heart healthy low-sodium diet -Norvasc 10 mg, and losartan 100 mg  2.  Hypertension:  -Blood pressure today was 120/ 60,  and at home have been as high as 170's-180's with improved rate in the 70's.  -We will add coreg 6.25 and she will log blood pressures for the next week. -Continue hydralazine 50 mg, -Continue Norvasc 10 mg -Continue losartan 100 mg  3.  Pulmonary fibrosis: Managed by pulmonology  -Continue Lasix 20 mg  4.  Hyperlipidemia:  -Continue Crestor 5 mg  -last LDL 74 (11/21/20)  5.  Diabetes mellitus type 2:  -Last hemoglobin A1c 6.8, followed by  PCP -continue heart healthy low carb diet  Disposition: Follow-up in 2 to 3 weeks after cardiac catheterization with Dr. Rockey Situ or APP  Shared Decision Making/Informed Consent The risks [stroke (1 in 1000), death (1 in 46), kidney failure [usually temporary] (1 in 500), bleeding (1 in 200), allergic reaction [possibly serious] (1 in 200)], benefits (diagnostic support and management of coronary artery disease) and alternatives of a cardiac catheterization were discussed in detail with Ms. Hase and she is willing to proceed.  Dispo:  No follow-ups on file.   Medication Adjustments/Labs and Tests Ordered: Current medicines are reviewed at length with the patient today.  Concerns regarding medicines are outlined above.  Tests Ordered: Left heart catheterization  Medication Changes: Carvedilol 6.25 mg twice daily  Signed, Mable Fill, Marissa Nestle, NP  04/30/2021 4:17 PM    [facility] [address], [place], [place]  [address] Phone: [phone]; Fax: [phone]

## 2021-05-01 ENCOUNTER — Encounter: Payer: Self-pay | Admitting: Medical

## 2021-05-01 ENCOUNTER — Other Ambulatory Visit
Admission: RE | Admit: 2021-05-01 | Discharge: 2021-05-01 | Disposition: A | Discharge status: Home / Self Care | Payer: Medicare HMO | Attending: Medical | Admitting: Medical

## 2021-05-01 ENCOUNTER — Other Ambulatory Visit: Payer: Self-pay

## 2021-05-01 ENCOUNTER — Ambulatory Visit: Payer: Medicare HMO | Admitting: Nurse Practitioner

## 2021-05-01 VITALS — BP 120/60 | HR 78 | Ht 62.0 in | Wt 205.0 lb

## 2021-05-01 DIAGNOSIS — E782 Mixed hyperlipidemia: Secondary | ICD-10-CM | POA: Diagnosis not present

## 2021-05-01 DIAGNOSIS — J8410 Pulmonary fibrosis, unspecified: Secondary | ICD-10-CM

## 2021-05-01 DIAGNOSIS — I25118 Atherosclerotic heart disease of native coronary artery with other forms of angina pectoris: Secondary | ICD-10-CM

## 2021-05-01 DIAGNOSIS — I10 Essential (primary) hypertension: Secondary | ICD-10-CM | POA: Diagnosis not present

## 2021-05-01 DIAGNOSIS — E1359 Other specified diabetes mellitus with other circulatory complications: Secondary | ICD-10-CM

## 2021-05-01 LAB — BASIC METABOLIC PANEL
Anion gap: 7 (ref 5–15)
BUN: 17 mg/dL (ref 8–23)
CO2: 27 mmol/L (ref 22–32)
Calcium: 9.2 mg/dL (ref 8.9–10.3)
Chloride: 104 mmol/L (ref 98–111)
Creatinine, Ser: 0.53 mg/dL (ref 0.44–1.00)
GFR, Estimated: >60 mL/min (ref >60)
Glucose, Bld: 122 mg/dL — ABNORMAL HIGH (ref 70–99)
Potassium: 3.6 mmol/L (ref 3.5–5.1)
Sodium: 138 mmol/L (ref 135–145)

## 2021-05-01 LAB — CBC
HCT: 40.7 % (ref 36.0–46.0)
Hemoglobin: 13.3 g/dL (ref 12.0–15.0)
MCH: 29.2 pg (ref 26.0–34.0)
MCHC: 32.7 g/dL (ref 30.0–36.0)
MCV: 89.3 fL (ref 80.0–100.0)
Platelets: 200 10*3/uL (ref 150–400)
RBC: 4.56 MIL/uL (ref 3.87–5.11)
RDW: 13.2 % (ref 11.5–15.5)
WBC: 5 10*3/uL (ref 4.0–10.5)
nRBC: 0 % (ref 0.0–0.2)

## 2021-05-01 MED ORDER — SODIUM CHLORIDE 0.9% FLUSH: 3.0000 mL | Freq: Two times a day (BID) | INTRAVENOUS | Status: DC

## 2021-05-01 MED ORDER — CARVEDILOL 6.25 MG PO TABS: 6.2500 mg | ORAL_TABLET | Freq: Two times a day (BID) | ORAL | 3 refills | Status: DC

## 2021-05-01 NOTE — Patient Instructions (Addendum)
Medication Instructions:  Your physician has recommended you make the following change in your medication:   START Carvedilol 6.25 mg twice a day  *If you need a refill on your cardiac medications before your next appointment, please call your pharmacy*   Lab Work: [place] today  If you have labs (blood work) drawn today and your tests are completely normal, you will receive your results only by: [place] (if you have MyChart) OR A paper copy in the mail If you have any lab test that is abnormal or we need to change your treatment, we will call you to review the results.   Testing/Procedures: [facility] Cardiac Cath Instructions  You are scheduled for a Cardiac Cath on: May 05, 2021 Please arrive at 08:30 am on the day of your procedure Please expect a call from our [place] to pre-register you Do not eat/drink anything after midnight Someone will need to drive you home It is recommended someone be with you for the first 24 hours after your procedure Wear clothes that are easy to get on/off and wear slip on shoes if possible   Medications bring a current list of all medications with you  _XX__ You may take all of your other medications the morning of your procedure with enough water to swallow safely  _XX__ Do not take these medications before your procedure: Furosemide.    Day of your procedure: Arrive at the [facility] entrance.  Free valet service is available.  After entering the [place] please check-in at the registration desk (1st desk on your right) to receive your armband. After receiving your armband someone will escort you to the cardiac cath/special procedures waiting area.  The usual length of stay after your procedure is about 2 to 3 hours.  This can vary.  If you have any questions, please call our office at [phone], or you may call the cardiac cath lab at [facility] directly at [phone]   Follow-Up: At [facility], you and your health needs are our priority.  As part of our continuing mission to provide you with exceptional heart care, we have created designated Provider Care Teams.  These Care Teams include your primary Cardiologist (physician) and Advanced Practice Providers (APPs -  Physician Assistants and Nurse Practitioners) who all work together to provide you with the care you need, when you need it.  We recommend signing up for the patient portal called "MyChart".  Sign up information is provided on this After Visit Summary.  MyChart is used to connect with patients for Virtual Visits (Telemedicine).  Patients are able to view lab/test results, encounter notes, upcoming appointments, etc.  Non-urgent messages can be sent to your provider as well.   To learn more about what you can do with MyChart, go to [email or web address].    Your next appointment:   2 week(s) after heart cath  The format for your next appointment:   In Person  Provider:   Ida Rogue, MD or Cadence Kathlen Mody, PA-C   Continue keeping blood pressure log.

## 2021-05-02 ENCOUNTER — Telehealth: Payer: Self-pay | Admitting: Emergency Medicine

## 2021-05-02 NOTE — Telephone Encounter (Signed)
Called and spoke with patient. Results reviewed with patient, pt verbalized understanding,  questions (if any) answered.   ?

## 2021-05-02 NOTE — Telephone Encounter (Signed)
-----   Message from [place], PA-C sent at 05/02/2021  9:52 AM EST ----- Labs unremarkable.

## 2021-05-05 ENCOUNTER — Ambulatory Visit
Admission: RE | Admit: 2021-05-05 | Discharge: 2021-05-06 | Disposition: A | Discharge status: Home / Self Care | Payer: Medicare HMO | Attending: Cardiovascular Disease | Admitting: Cardiovascular Disease

## 2021-05-05 ENCOUNTER — Encounter: Payer: Self-pay | Admitting: Cardiovascular Disease

## 2021-05-05 ENCOUNTER — Encounter
Admission: RE | Disposition: A | Discharge status: Home / Self Care | Payer: Medicare HMO | Source: Home / Self Care | Attending: Cardiovascular Disease

## 2021-05-05 ENCOUNTER — Other Ambulatory Visit: Payer: Self-pay

## 2021-05-05 DIAGNOSIS — I10 Essential (primary) hypertension: Secondary | ICD-10-CM | POA: Insufficient documentation

## 2021-05-05 DIAGNOSIS — I2584 Coronary atherosclerosis due to calcified coronary lesion: Secondary | ICD-10-CM | POA: Insufficient documentation

## 2021-05-05 DIAGNOSIS — I25118 Atherosclerotic heart disease of native coronary artery with other forms of angina pectoris: Secondary | ICD-10-CM | POA: Diagnosis not present

## 2021-05-05 DIAGNOSIS — Z6837 Body mass index (BMI) 37.0-37.9, adult: Secondary | ICD-10-CM | POA: Insufficient documentation

## 2021-05-05 DIAGNOSIS — I209 Angina pectoris, unspecified: Secondary | ICD-10-CM | POA: Diagnosis present

## 2021-05-05 DIAGNOSIS — E782 Mixed hyperlipidemia: Secondary | ICD-10-CM | POA: Insufficient documentation

## 2021-05-05 DIAGNOSIS — Z9861 Coronary angioplasty status: Secondary | ICD-10-CM | POA: Diagnosis not present

## 2021-05-05 DIAGNOSIS — R0602 Shortness of breath: Secondary | ICD-10-CM | POA: Diagnosis not present

## 2021-05-05 DIAGNOSIS — K219 Gastro-esophageal reflux disease without esophagitis: Secondary | ICD-10-CM | POA: Diagnosis not present

## 2021-05-05 DIAGNOSIS — I208 Other forms of angina pectoris: Secondary | ICD-10-CM | POA: Diagnosis present

## 2021-05-05 DIAGNOSIS — E119 Type 2 diabetes mellitus without complications: Secondary | ICD-10-CM | POA: Insufficient documentation

## 2021-05-05 DIAGNOSIS — J8410 Pulmonary fibrosis, unspecified: Secondary | ICD-10-CM | POA: Insufficient documentation

## 2021-05-05 DIAGNOSIS — I2510 Atherosclerotic heart disease of native coronary artery without angina pectoris: Secondary | ICD-10-CM | POA: Diagnosis present

## 2021-05-05 DIAGNOSIS — I25119 Atherosclerotic heart disease of native coronary artery with unspecified angina pectoris: Secondary | ICD-10-CM | POA: Diagnosis not present

## 2021-05-05 DIAGNOSIS — Z7984 Long term (current) use of oral hypoglycemic drugs: Secondary | ICD-10-CM | POA: Insufficient documentation

## 2021-05-05 HISTORY — PX: LEFT HEART CATH AND CORONARY ANGIOGRAPHY: CATH118249

## 2021-05-05 HISTORY — PX: CORONARY STENT INTERVENTION: CATH118234

## 2021-05-05 LAB — CREATININE, SERUM
Creatinine, Ser: 0.52 mg/dL (ref 0.44–1.00)
GFR, Estimated: >60 mL/min (ref >60)

## 2021-05-05 LAB — GLUCOSE, CAPILLARY
Glucose-Capillary: 142 mg/dL — ABNORMAL HIGH (ref 70–99)
Glucose-Capillary: 102 mg/dL — ABNORMAL HIGH (ref 70–99)

## 2021-05-05 LAB — CBC
HCT: 36.6 % (ref 36.0–46.0)
Hemoglobin: 12.3 g/dL (ref 12.0–15.0)
MCH: 29.3 pg (ref 26.0–34.0)
MCHC: 33.6 g/dL (ref 30.0–36.0)
MCV: 87.1 fL (ref 80.0–100.0)
Platelets: 180 10*3/uL (ref 150–400)
RBC: 4.2 MIL/uL (ref 3.87–5.11)
RDW: 13.2 % (ref 11.5–15.5)
WBC: 6 10*3/uL (ref 4.0–10.5)
nRBC: 0 % (ref 0.0–0.2)

## 2021-05-05 LAB — POCT ACTIVATED CLOTTING TIME
Activated Clotting Time: 281 seconds
Activated Clotting Time: 323 seconds

## 2021-05-05 SURGERY — LEFT HEART CATH AND CORONARY ANGIOGRAPHY
Anesthesia: Moderate Sedation

## 2021-05-05 MED ORDER — ASCORBIC ACID 500 MG PO TABS
500.0000 mg | ORAL_TABLET | Freq: Every evening | ORAL | Status: DC
Start: 2021-05-05 — End: 2021-05-06
  Administered 2021-05-05: 500 mg via ORAL
  Filled 2021-05-05: qty 1

## 2021-05-05 MED ORDER — REVEFENACIN 175 MCG/3ML IN SOLN
175.0000 ug | Freq: Every day | RESPIRATORY_TRACT | Status: DC
  Filled 2021-05-05: qty 3

## 2021-05-05 MED ORDER — FENTANYL CITRATE (PF) 100 MCG/2ML IJ SOLN
INTRAMUSCULAR | Status: DC | PRN
  Administered 2021-05-05 (×2): 25 ug via INTRAVENOUS

## 2021-05-05 MED ORDER — HEPARIN SODIUM (PORCINE) 1000 UNIT/ML IJ SOLN
INTRAMUSCULAR | Status: AC
  Filled 2021-05-05: qty 10

## 2021-05-05 MED ORDER — TRAMADOL HCL 50 MG PO TABS
50.0000 mg | ORAL_TABLET | Freq: Three times a day (TID) | ORAL | Status: DC | PRN
  Administered 2021-05-05 – 2021-05-06 (×2): 50 mg via ORAL
  Filled 2021-05-05 (×2): qty 1

## 2021-05-05 MED ORDER — FLUTICASONE PROPIONATE 50 MCG/ACT NA SUSP
2.0000 | Freq: Two times a day (BID) | NASAL | Status: DC
  Administered 2021-05-06: 2 via NASAL
  Filled 2021-05-05: qty 16

## 2021-05-05 MED ORDER — IOHEXOL 300 MG/ML  SOLN
INTRAMUSCULAR | Status: DC | PRN
  Administered 2021-05-05: 152 mL

## 2021-05-05 MED ORDER — ENOXAPARIN SODIUM 60 MG/0.6ML IJ SOSY
0.5000 mg/kg | PREFILLED_SYRINGE | INTRAMUSCULAR | Status: DC
  Administered 2021-05-06: 47.5 mg via SUBCUTANEOUS
  Filled 2021-05-05: qty 0.6

## 2021-05-05 MED ORDER — SODIUM CHLORIDE 0.9% FLUSH
3.0000 mL | Freq: Two times a day (BID) | INTRAVENOUS | Status: DC
  Administered 2021-05-06: 3 mL via INTRAVENOUS

## 2021-05-05 MED ORDER — NITROGLYCERIN 1 MG/10 ML FOR IR/CATH LAB
INTRA_ARTERIAL | Status: DC | PRN
  Administered 2021-05-05: 300 mL via INTRA_ARTERIAL

## 2021-05-05 MED ORDER — HYDROXYZINE HCL 10 MG PO TABS
10.0000 mg | ORAL_TABLET | Freq: Three times a day (TID) | ORAL | Status: DC | PRN
  Filled 2021-05-05: qty 1

## 2021-05-05 MED ORDER — CLOPIDOGREL BISULFATE 75 MG PO TABS
ORAL_TABLET | ORAL | Status: DC | PRN
  Administered 2021-05-05: 600 mg via ORAL

## 2021-05-05 MED ORDER — HEPARIN (PORCINE) IN NACL 1000-0.9 UT/500ML-% IV SOLN
INTRAVENOUS | Status: AC
  Filled 2021-05-05: qty 1000

## 2021-05-05 MED ORDER — MIDAZOLAM HCL 2 MG/2ML IJ SOLN
INTRAMUSCULAR | Status: AC
  Filled 2021-05-05: qty 2

## 2021-05-05 MED ORDER — SODIUM CHLORIDE 0.9 % IV SOLN: INTRAVENOUS | Status: AC

## 2021-05-05 MED ORDER — ASPIRIN 81 MG PO CHEW
CHEWABLE_TABLET | ORAL | Status: AC
  Filled 2021-05-05: qty 1

## 2021-05-05 MED ORDER — CARVEDILOL 12.5 MG PO TABS
12.5000 mg | ORAL_TABLET | Freq: Two times a day (BID) | ORAL | Status: DC
Start: 2021-05-05 — End: 2021-05-06
  Administered 2021-05-05 – 2021-05-06 (×2): 12.5 mg via ORAL
  Filled 2021-05-05 (×2): qty 1

## 2021-05-05 MED ORDER — PANTOPRAZOLE SODIUM 40 MG PO TBEC
40.0000 mg | DELAYED_RELEASE_TABLET | Freq: Every evening | ORAL | Status: DC
  Administered 2021-05-05: 40 mg via ORAL
  Filled 2021-05-05: qty 1

## 2021-05-05 MED ORDER — FUROSEMIDE 20 MG PO TABS
20.0000 mg | ORAL_TABLET | Freq: Every day | ORAL | Status: DC
Start: 2021-05-06 — End: 2021-05-06
  Administered 2021-05-06: 20 mg via ORAL
  Filled 2021-05-05: qty 1

## 2021-05-05 MED ORDER — MIDAZOLAM HCL 2 MG/2ML IJ SOLN
INTRAMUSCULAR | Status: DC | PRN
Start: 2021-05-05 — End: 2021-05-05
  Administered 2021-05-05 (×2): 1 mg via INTRAVENOUS

## 2021-05-05 MED ORDER — HYDRALAZINE HCL 50 MG PO TABS
50.0000 mg | ORAL_TABLET | Freq: Three times a day (TID) | ORAL | Status: DC
  Administered 2021-05-05 – 2021-05-06 (×3): 50 mg via ORAL
  Filled 2021-05-05 (×3): qty 1

## 2021-05-05 MED ORDER — SODIUM CHLORIDE 0.9 % WEIGHT BASED INFUSION: 1.0000 mL/kg/h | INTRAVENOUS | Status: DC

## 2021-05-05 MED ORDER — VERAPAMIL HCL 2.5 MG/ML IV SOLN
INTRAVENOUS | Status: AC
  Filled 2021-05-05: qty 2

## 2021-05-05 MED ORDER — LABETALOL HCL 5 MG/ML IV SOLN
INTRAVENOUS | Status: DC | PRN
  Administered 2021-05-05 (×2): 10 mg via INTRAVENOUS

## 2021-05-05 MED ORDER — HEPARIN (PORCINE) IN NACL 1000-0.9 UT/500ML-% IV SOLN
INTRAVENOUS | Status: DC | PRN
  Administered 2021-05-05: 1000 mL

## 2021-05-05 MED ORDER — ACETAMINOPHEN 500 MG PO TABS
ORAL_TABLET | ORAL | Status: AC
  Filled 2021-05-05: qty 2

## 2021-05-05 MED ORDER — ALPRAZOLAM 0.25 MG PO TABS
0.2500 mg | ORAL_TABLET | Freq: Three times a day (TID) | ORAL | Status: DC | PRN
  Administered 2021-05-05 – 2021-05-06 (×2): 0.25 mg via ORAL
  Filled 2021-05-05 (×2): qty 1

## 2021-05-05 MED ORDER — LABETALOL HCL 5 MG/ML IV SOLN
INTRAVENOUS | Status: AC
  Filled 2021-05-05: qty 4

## 2021-05-05 MED ORDER — SODIUM CHLORIDE 0.9% FLUSH: 3.0000 mL | INTRAVENOUS | Status: DC | PRN

## 2021-05-05 MED ORDER — CALCIUM CITRATE 950 (200 CA) MG PO TABS
1.0000 | ORAL_TABLET | Freq: Two times a day (BID) | ORAL | Status: DC
  Administered 2021-05-06: 200 mg via ORAL
  Filled 2021-05-05 (×3): qty 1

## 2021-05-05 MED ORDER — ASPIRIN 81 MG PO CHEW
81.0000 mg | CHEWABLE_TABLET | Freq: Every day | ORAL | Status: DC
  Administered 2021-05-06: 81 mg via ORAL
  Filled 2021-05-05: qty 1

## 2021-05-05 MED ORDER — ROSUVASTATIN CALCIUM 20 MG PO TABS
20.0000 mg | ORAL_TABLET | Freq: Every day | ORAL | Status: DC
  Administered 2021-05-05: 20 mg via ORAL
  Filled 2021-05-05 (×2): qty 1

## 2021-05-05 MED ORDER — ACETAMINOPHEN 325 MG PO TABS: 650.0000 mg | ORAL_TABLET | ORAL | Status: DC | PRN

## 2021-05-05 MED ORDER — SODIUM CHLORIDE 0.9 % IV SOLN: 250.0000 mL | INTRAVENOUS | Status: DC | PRN

## 2021-05-05 MED ORDER — LABETALOL HCL 5 MG/ML IV SOLN: 10.0000 mg | INTRAVENOUS | Status: AC | PRN

## 2021-05-05 MED ORDER — CLOPIDOGREL BISULFATE 75 MG PO TABS
ORAL_TABLET | ORAL | Status: AC
  Filled 2021-05-05: qty 8

## 2021-05-05 MED ORDER — LOSARTAN POTASSIUM 50 MG PO TABS
100.0000 mg | ORAL_TABLET | Freq: Every day | ORAL | Status: DC
Start: 2021-05-05 — End: 2021-05-06
  Administered 2021-05-05: 100 mg via ORAL
  Filled 2021-05-05: qty 2

## 2021-05-05 MED ORDER — ALBUTEROL SULFATE (2.5 MG/3ML) 0.083% IN NEBU
3.0000 mL | INHALATION_SOLUTION | Freq: Four times a day (QID) | RESPIRATORY_TRACT | Status: DC | PRN
  Filled 2021-05-05: qty 3

## 2021-05-05 MED ORDER — TRAZODONE HCL 50 MG PO TABS
150.0000 mg | ORAL_TABLET | Freq: Every day | ORAL | Status: DC
Start: 2021-05-05 — End: 2021-05-06
  Administered 2021-05-05: 150 mg via ORAL
  Filled 2021-05-05: qty 3

## 2021-05-05 MED ORDER — FENTANYL CITRATE (PF) 100 MCG/2ML IJ SOLN
INTRAMUSCULAR | Status: AC
Start: 2021-05-05 — End: ?
  Filled 2021-05-05: qty 2

## 2021-05-05 MED ORDER — CITALOPRAM HYDROBROMIDE 20 MG PO TABS
20.0000 mg | ORAL_TABLET | Freq: Every day | ORAL | Status: DC
Start: 2021-05-06 — End: 2021-05-06
  Administered 2021-05-06: 20 mg via ORAL
  Filled 2021-05-05: qty 1

## 2021-05-05 MED ORDER — ACETAMINOPHEN 500 MG PO TABS
1000.0000 mg | ORAL_TABLET | ORAL | Status: DC | PRN
  Administered 2021-05-05 – 2021-05-06 (×2): 1000 mg via ORAL
  Filled 2021-05-05: qty 2

## 2021-05-05 MED ORDER — SODIUM CHLORIDE 0.9 % WEIGHT BASED INFUSION
3.0000 mL/kg/h | INTRAVENOUS | Status: DC
  Administered 2021-05-05: 3 mL/kg/h via INTRAVENOUS

## 2021-05-05 MED ORDER — LORAZEPAM 2 MG/ML IJ SOLN
1.0000 mg | Freq: Once | INTRAMUSCULAR | Status: AC
  Administered 2021-05-05: 1 mg via INTRAVENOUS
  Filled 2021-05-05: qty 1

## 2021-05-05 MED ORDER — AMLODIPINE BESYLATE 5 MG PO TABS
10.0000 mg | ORAL_TABLET | Freq: Every day | ORAL | Status: DC
Start: 2021-05-05 — End: 2021-05-06
  Administered 2021-05-05: 10 mg via ORAL
  Filled 2021-05-05: qty 2

## 2021-05-05 MED ORDER — CLOPIDOGREL BISULFATE 75 MG PO TABS
75.0000 mg | ORAL_TABLET | Freq: Every day | ORAL | Status: DC
  Administered 2021-05-06: 75 mg via ORAL
  Filled 2021-05-05: qty 1

## 2021-05-05 MED ORDER — VERAPAMIL HCL 2.5 MG/ML IV SOLN
INTRAVENOUS | Status: DC | PRN
Start: 2021-05-05 — End: 2021-05-05
  Administered 2021-05-05: 2.5 mg via INTRA_ARTERIAL

## 2021-05-05 MED ORDER — TRAMADOL HCL 50 MG PO TABS
ORAL_TABLET | ORAL | Status: AC
  Filled 2021-05-05: qty 1

## 2021-05-05 MED ORDER — SODIUM CHLORIDE 0.9% FLUSH
3.0000 mL | Freq: Two times a day (BID) | INTRAVENOUS | Status: DC
  Administered 2021-05-05 – 2021-05-06 (×2): 3 mL via INTRAVENOUS

## 2021-05-05 MED ORDER — GLIPIZIDE ER 5 MG PO TB24
5.0000 mg | ORAL_TABLET | Freq: Every day | ORAL | Status: DC
Start: 2021-05-06 — End: 2021-05-06
  Administered 2021-05-06: 5 mg via ORAL
  Filled 2021-05-05: qty 1

## 2021-05-05 MED ORDER — BUDESONIDE 0.25 MG/2ML IN SUSP: 0.2500 mg | Freq: Two times a day (BID) | RESPIRATORY_TRACT | Status: DC

## 2021-05-05 MED ORDER — ASPIRIN 81 MG PO CHEW
81.0000 mg | CHEWABLE_TABLET | ORAL | Status: AC
  Administered 2021-05-05: 81 mg via ORAL

## 2021-05-05 MED ORDER — HEPARIN SODIUM (PORCINE) 1000 UNIT/ML IJ SOLN
INTRAMUSCULAR | Status: DC | PRN
  Administered 2021-05-05: 5000 [IU] via INTRAVENOUS
  Administered 2021-05-05: 2000 [IU] via INTRAVENOUS
  Administered 2021-05-05: 4000 [IU] via INTRAVENOUS

## 2021-05-05 MED ORDER — BUDESONIDE 0.25 MG/2ML IN SUSP
0.2500 mg | Freq: Two times a day (BID) | RESPIRATORY_TRACT | Status: DC
  Administered 2021-05-06: 0.25 mg via RESPIRATORY_TRACT
  Filled 2021-05-05 (×2): qty 2

## 2021-05-05 MED ORDER — ONDANSETRON HCL 4 MG/2ML IJ SOLN
4.0000 mg | Freq: Four times a day (QID) | INTRAMUSCULAR | Status: DC | PRN
Start: 2021-05-05 — End: 2021-05-06
  Administered 2021-05-05 – 2021-05-06 (×2): 4 mg via INTRAVENOUS
  Filled 2021-05-05 (×2): qty 2

## 2021-05-05 SURGICAL SUPPLY — 24 items
BALLN SCOREFLEX 3.50X15 (BALLOONS) ×3
BALLN TREK RX 3.0X12 (BALLOONS) ×3
BALLN ~~LOC~~ TREK RX 4.0X12 (BALLOONS) ×3
BALLOON SCOREFLEX 3.50X15 (BALLOONS) IMPLANT
BALLOON TREK RX 3.0X12 (BALLOONS) IMPLANT
BALLOON ~~LOC~~ TREK RX 4.0X12 (BALLOONS) IMPLANT
CATH INFINITI 5FR JK (CATHETERS) ×1 IMPLANT
CATH INFINITI JR4 5F (CATHETERS) ×1 IMPLANT
CATH LAUNCHER 6FR JR4 (CATHETERS) ×1 IMPLANT
CATH SHOCKWAVE 3.5X12 (CATHETERS) IMPLANT
CATHETER SHOCKWAVE 3.5X12 (CATHETERS) ×3
DEVICE RAD TR BAND REGULAR (VASCULAR PRODUCTS) ×1 IMPLANT
DRAPE BRACHIAL (DRAPES) ×1 IMPLANT
GLIDESHEATH SLEND SS 6F .021 (SHEATH) ×1 IMPLANT
GUIDEWIRE INQWIRE 1.5J.035X260 (WIRE) IMPLANT
INQWIRE 1.5J .035X260CM (WIRE) ×3
KIT ENCORE 26 ADVANTAGE (KITS) ×1 IMPLANT
PACK CARDIAC CATH (CUSTOM PROCEDURE TRAY) ×3 IMPLANT
PROTECTION STATION PRESSURIZED (MISCELLANEOUS) ×3
SET ATX SIMPLICITY (MISCELLANEOUS) ×1 IMPLANT
STATION PROTECTION PRESSURIZED (MISCELLANEOUS) IMPLANT
STENT ONYX FRONTIER 4.0X15 (Permanent Stent) ×1 IMPLANT
TUBING CIL FLEX 10 FLL-RA (TUBING) ×1 IMPLANT
WIRE RUNTHROUGH .014X180CM (WIRE) ×1 IMPLANT

## 2021-05-05 NOTE — Progress Notes (Signed)
Patient still experiencing nausea after dose of Zofran at [place]. Patient has no other PRNs at this time. Vital signs obtained. Dr. Oval Linsey paged, called back and order received for 1 mg of Ativan x1.

## 2021-05-05 NOTE — Plan of Care (Signed)

## 2021-05-05 NOTE — Interval H&P Note (Signed)
History and Physical Interval Note:  05/05/2021 10:59 AM  Tiffany Alexander  has presented today for surgery, with the diagnosis of LT Cath   Shortnes of breath   Positive coronary CT.  The various methods of treatment have been discussed with the patient and family. After consideration of risks, benefits and other options for treatment, the patient has consented to  Procedure(s): LEFT HEART CATH AND CORONARY ANGIOGRAPHY (Left) as a surgical intervention.  The patient's history has been reviewed, patient examined, no change in status, stable for surgery.  I have reviewed the patient's chart and labs.  Questions were answered to the patient's satisfaction.     Kathlyn Sacramento

## 2021-05-05 NOTE — Progress Notes (Signed)
she had very bad vomitting after eating crackers, PRN medication given, PRN xanax given, MD Arida notified.

## 2021-05-05 NOTE — Progress Notes (Signed)
PHARMACIST - PHYSICIAN COMMUNICATION  CONCERNING:  Enoxaparin (Lovenox) for DVT Prophylaxis    RECOMMENDATION: Patient was prescribed enoxaprin 40mg  q24 hours for VTE prophylaxis.   Filed Weights   05/05/21 0829  Weight: 93 kg (205 lb)    Body mass index is 37.49 kg/m.  Estimated Creatinine Clearance: 62.6 mL/min (by C-G formula based on SCr of 0.53 mg/dL).   Based on [place] patient is candidate for enoxaparin 0.5mg /kg TBW SQ every 24 hours based on BMI being >30.  DESCRIPTION: Pharmacy has adjusted enoxaparin dose per [facility] policy.  Patient is now receiving enoxaparin 47.5 mg every 24 hours    Berta Minor, PharmD Clinical Pharmacist  05/05/2021 5:09 PM

## 2021-05-06 ENCOUNTER — Encounter: Payer: Self-pay | Admitting: Cardiovascular Disease

## 2021-05-06 DIAGNOSIS — I25118 Atherosclerotic heart disease of native coronary artery with other forms of angina pectoris: Secondary | ICD-10-CM | POA: Diagnosis not present

## 2021-05-06 DIAGNOSIS — I10 Essential (primary) hypertension: Secondary | ICD-10-CM | POA: Diagnosis not present

## 2021-05-06 DIAGNOSIS — J8410 Pulmonary fibrosis, unspecified: Secondary | ICD-10-CM | POA: Diagnosis not present

## 2021-05-06 DIAGNOSIS — K219 Gastro-esophageal reflux disease without esophagitis: Secondary | ICD-10-CM | POA: Diagnosis not present

## 2021-05-06 DIAGNOSIS — E119 Type 2 diabetes mellitus without complications: Secondary | ICD-10-CM | POA: Diagnosis not present

## 2021-05-06 DIAGNOSIS — I25119 Atherosclerotic heart disease of native coronary artery with unspecified angina pectoris: Secondary | ICD-10-CM | POA: Diagnosis not present

## 2021-05-06 DIAGNOSIS — E782 Mixed hyperlipidemia: Secondary | ICD-10-CM | POA: Diagnosis not present

## 2021-05-06 DIAGNOSIS — I2584 Coronary atherosclerosis due to calcified coronary lesion: Secondary | ICD-10-CM | POA: Diagnosis not present

## 2021-05-06 DIAGNOSIS — R0602 Shortness of breath: Secondary | ICD-10-CM | POA: Diagnosis not present

## 2021-05-06 LAB — CBC
HCT: 36.1 % (ref 36.0–46.0)
Hemoglobin: 11.9 g/dL — ABNORMAL LOW (ref 12.0–15.0)
MCH: 29.4 pg (ref 26.0–34.0)
MCHC: 33 g/dL (ref 30.0–36.0)
MCV: 89.1 fL (ref 80.0–100.0)
Platelets: 167 10*3/uL (ref 150–400)
RBC: 4.05 MIL/uL (ref 3.87–5.11)
RDW: 13.1 % (ref 11.5–15.5)
WBC: 5 10*3/uL (ref 4.0–10.5)
nRBC: 0 % (ref 0.0–0.2)

## 2021-05-06 LAB — BASIC METABOLIC PANEL
Anion gap: 8 (ref 5–15)
BUN: 15 mg/dL (ref 8–23)
CO2: 27 mmol/L (ref 22–32)
Calcium: 8.8 mg/dL — ABNORMAL LOW (ref 8.9–10.3)
Chloride: 106 mmol/L (ref 98–111)
Creatinine, Ser: 0.57 mg/dL (ref 0.44–1.00)
GFR, Estimated: >60 mL/min (ref >60)
Glucose, Bld: 138 mg/dL — ABNORMAL HIGH (ref 70–99)
Potassium: 3.8 mmol/L (ref 3.5–5.1)
Sodium: 141 mmol/L (ref 135–145)

## 2021-05-06 MED ORDER — CLOPIDOGREL BISULFATE 75 MG PO TABS
75.0000 mg | ORAL_TABLET | Freq: Every day | ORAL | 11 refills | Status: DC
Start: 2021-05-07 — End: 2021-05-21

## 2021-05-06 MED ORDER — PANTOPRAZOLE SODIUM 40 MG PO TBEC
40.0000 mg | DELAYED_RELEASE_TABLET | Freq: Every evening | ORAL | 2 refills | Status: DC
Start: 2021-05-06 — End: 2021-05-13

## 2021-05-06 MED ORDER — ROSUVASTATIN CALCIUM 20 MG PO TABS: 20.0000 mg | ORAL_TABLET | Freq: Every day | ORAL | 5 refills | Status: AC

## 2021-05-06 MED ORDER — ASPIRIN 81 MG PO CHEW
81.0000 mg | CHEWABLE_TABLET | Freq: Every day | ORAL | 11 refills | Status: DC
Start: 2021-05-07 — End: 2021-08-12

## 2021-05-06 MED ORDER — CARVEDILOL 12.5 MG PO TABS: 12.5000 mg | ORAL_TABLET | Freq: Two times a day (BID) | ORAL | 2 refills | Status: DC

## 2021-05-06 MED ORDER — ONDANSETRON 4 MG PO TBDP: 4.0000 mg | ORAL_TABLET | Freq: Three times a day (TID) | ORAL | 0 refills | Status: AC | PRN

## 2021-05-06 NOTE — Discharge Summary (Signed)
Discharge Summary    Patient ID: Tiffany Alexander  MRN: [id number], DOB/AGE: [birth year] 78 y.o.  Admit Date: 05/05/2021 Discharge Date: 05/06/2021  Primary Care Provider: [place] Pink, MD Primary Cardiologist: Dr. Rockey Situ, MD  Discharge Diagnoses    Principal Problem:   Effort angina [facility]) Active Problems:   Coronary artery disease involving native coronary artery of native heart   Diabetes (HCC)   HTN (hypertension)   Hyperlipidemia, mixed   Allergies Allergies  Allergen Reactions   Codeine Itching   Erythromycin Other (See Comments)    Stomach cramps   Nitrofurantoin Other (See Comments)    Scarring of lung per pt   Penicillins Rash    Has patient had a PCN reaction causing immediate rash, facial/tongue/throat swelling, SOB or lightheadedness with hypotension: Yes Has patient had a PCN reaction causing severe rash involving mucus membranes or skin necrosis: Unknown Has patient had a PCN reaction that required hospitalization No Has patient had a PCN reaction occurring within the last 10 years: No If all of the above answers are "NO", then may proceed with Cephalosporin use.      History of Present Illness     76 year old female with history of CAD, DM 2, HTN, HLD, asthma, and mild pulmonary fibrosis who was recently seen in the office for progressive dyspnea.   She was previously followed by Dr. Nehemiah Massed, with [facility] cardiology, with echo in 09/2017 demonstrating a preserved LV systolic function.  Nuclear stress testing at that time showed no evidence of ischemia.  CT chest showed multivessel coronary artery calcification and aortic atherosclerosis.   She is followed by pulmonology for asthma and mild pulmonary fibrosis.  She underwent echo in 01/2021, due to progressive dyspnea, that showed an EF of 60 to 65%, no regional wall motion abnormalities, grade 1 diastolic dysfunction, normal RV systolic function and ventricular cavity size, mildly calcified aortic valve  without evidence of stenosis, and an estimated right atrial pressure of 3 mmHg.   She was initially evaluated by Dr. Rockey Situ on 04/15/2021, at the request of pulmonology dyspnea and bradycardia with coronary artery calcification noted on prior imaging.  At that time, it was noted the patient had progressive dyspnea over the prior several months.  Subsequent coronary CTA on 04/24/2021 demonstrated 25% proximal LAD stenosis, less than 25% proximal LCx stenosis, severe ostial RCA stenosis at greater than 70%.  ctFFR reported as normal.  She followed up on 05/01/2021 with continued dyspnea.  Given ongoing symptoms, she was scheduled for diagnostic LHC.   Hospital Course     Consultants: Cardiac rehab   She underwent LHC on 05/05/2021 which showed severe one-vessel CAD with 90% heavily calcified ostial RCA stenosis with mild to moderate disease affecting the left coronary tree.  There was normal LV systolic function and mildly elevated LVEDP.  She underwent successful complex angioplasty and drug-eluting stent placement to the ostial RCA using intravascular lithotripsy and flex score Cutting Balloon.  The stent extended 1 to 2 mm into the aorta.  Overall, this was a difficult procedure.  DAPT was recommended for a minimum of 6 months.  On the evening following her cath, she had two episodes of emesis along with a headache. She has not had any further emesis since.  She reports a prior reaction to sedation in the past.  She received Tylenol with improvement in her headache this morning. She has ambulated without symptoms of angina or decompensation.     CAD involving the native coronary arteries  with exertional dyspnea felt to be an anginal equivalent:  -Currently chest pain-free  -Continue DAPT with ASA and clopidogrel for a minimum of 6 months without interruption  -Carvedilol  -Crestor  -Aggressive risk factor modification  -Post-cath instructions  -Cardiac rehab   HTN:  -Blood pressure  stable -Continue PTA amlodipine, Coreg, Lasix, hydralazine, and losartan   HLD:  -LDL 74 in 11/2020  -Crestor titrated to 20 mg -Follow up lipid panel and LFT in the outpatient setting  DM2:  -A1c 6.8  -Follow-up with PCP as directed   Asthma/very mild pulmonary fibrosis:  -PTA inhalers  -Follow-up with pulmonology as directed    The patient's right radial cath site has been examined is healing well without issues at this time. The patient has been seen by Dr. Saunders Revel and felt to be stable for discharge today. All follow up appointments have been made. Discharge medications are listed below. Prescriptions have been reviewed with the patient and sent in to their pharmacy.  _____________  Discharge Vitals Blood pressure (!) 119/51, pulse 73, temperature 97.7 F (36.5 C), resp. rate 17, height $RemoveBe'5\' 2"'dinxfuyCt$  (1.575 m), weight 93 kg, SpO2 90 %.  Filed Weights   05/05/21 0829  Weight: 93 kg   Physical Exam   GEN: No acute distress.   Neck: No JVD. Cardiac: RRR, no murmurs, rubs, or gallops. Right radial arteriotomy site is without bleeding, bruising, swelling, warmth, erythema, or TTP. Radial pulse 2+ proximal and distal to the arteriotomy site.  Respiratory: Clear to auscultation bilaterally.  GI: Soft, nontender, non-distended.   MS: No edema; No deformity. Neuro:  Alert and oriented x 3; Nonfocal.  Psych: Normal affect.  Labs & Radiologic Studies    CBC Recent Labs    05/05/21 1712 05/06/21 0632  WBC 6.0 5.0  HGB 12.3 11.9*  HCT 36.6 36.1  MCV 87.1 89.1  PLT 180 161   Basic Metabolic Panel Recent Labs    05/05/21 1712 05/06/21 0632  NA  --  141  K  --  3.8  CL  --  106  CO2  --  27  GLUCOSE  --  138*  BUN  --  15  CREATININE 0.52 0.57  CALCIUM  --  8.8*   Liver Function Tests No results for input(s): AST, ALT, ALKPHOS, BILITOT, PROT, ALBUMIN in the last 72 hours. No results for input(s): LIPASE, AMYLASE in the last 72 hours. Cardiac Enzymes No results for  input(s): CKTOTAL, CKMB, CKMBINDEX, TROPONINI in the last 72 hours. BNP Invalid input(s): POCBNP D-Dimer No results for input(s): DDIMER in the last 72 hours. Hemoglobin A1C No results for input(s): HGBA1C in the last 72 hours. Fasting Lipid Panel No results for input(s): CHOL, HDL, LDLCALC, TRIG, CHOLHDL, LDLDIRECT in the last 72 hours. Thyroid Function Tests No results for input(s): TSH, T4TOTAL, T3FREE, THYROIDAB in the last 72 hours.  Invalid input(s): FREET3 _____________   CT CORONARY MORPH W/CTA COR W/SCORE W/CA W/CM &/OR WO/CM  Addendum Date: 04/25/2021    IMPRESSION: 1. No acute cardiopulmonary abnormalities. 2. Stable appearance of small bilateral pulmonary nodules. These are compatible with a benign process. 3. Similar appearance of bilateral chronic appearing peripheral predominant interstitial reticulation which may reflect sequelae of chronic interstitial lung disease. 4. Borderline enlarged right hilar lymph nodes. Similar to previous exam. Electronically Signed   By: Kerby Moors M.D.   On: 04/25/2021 14:03   Result Date: 04/25/2021 IMPRESSION: 1. Coronary calcium score of 540. This was 85th percentile for age  and sex matched control. 2. Normal coronary origin with right dominance. 3. Severe ostial RCA stenosis (>70%). 4. Mild proximal LAD disease. 5. CAD-RADS 4 Severe stenosis. (70-99% or > 50% left main). Cardiac catheterization is recommended. 6. Additional analysis with CT FFR will be submitted and reported separately. Electronically Signed: By: Kate Sable M.D. On: 04/24/2021 15:34   CT CORONARY FRACTIONAL FLOW RESERVE DATA PREP  Result Date: 05/05/2021 EXAM: CT FFR ANALYSIS CLINICAL DATA:  Abnormal CCTA IMPRESSION: 1.  CT FFR analysis didn't show any significant stenosis. Electronically Signed   By: Kate Sable M.D.   On: 04/25/2021 10:52   CT CORONARY FRACTIONAL FLOW RESERVE FLUID ANALYSIS  Result Date: 05/05/2021 EXAM: CT FFR ANALYSIS CLINICAL DATA:   Abnormal CCTA  IMPRESSION: 1.  CT FFR analysis didn't show any significant stenosis. Electronically Signed   By: Kate Sable M.D.   On: 04/25/2021 10:52    Diagnostic Studies/Procedures   LHC 05/05/2021:   Prox Cx to Mid Cx lesion is 40% stenosed.   Ramus lesion is 50% stenosed.   Ost RCA lesion is 90% stenosed.   A drug-eluting stent was successfully placed using a STENT ONYX FRONTIER 4.0X15.   Post intervention, there is a 0% residual stenosis.   The left ventricular systolic function is normal.   LV end diastolic pressure is mildly elevated.   The left ventricular ejection fraction is 55-65% by visual estimate.   1.  Severe one-vessel coronary artery disease with 90% heavily calcified ostial right coronary artery stenosis.  Mild to moderate disease affecting the left coronary artery system. 2.  Normal LV systolic function and mildly elevated left ventricular end-diastolic pressure. 3.  Successful complex angioplasty and drug-eluting stent placement to the ostial right coronary artery using intravascular lithotripsy and flex score Cutting Balloon.  The stent extended 1 to 2 mm into the aorta.  Difficult procedure overall.   Recommendations: Dual antiplatelet therapy for a minimum of 6 months. Aggressive treatment of risk factors.   Diagnostic Dominance: Right Left Main  Vessel is angiographically normal.    Left Anterior Descending  The vessel exhibits minimal luminal irregularities.    Ramus Intermedius  Ramus lesion is 50% stenosed.    Left Circumflex  Prox Cx to Mid Cx lesion is 40% stenosed. The lesion is mildly calcified.    Right Coronary Artery  Ost RCA lesion is 90% stenosed. The lesion is type C. The lesion is severely calcified. The lesion was not previously treated . IVUS was not performed.    Intervention   Ost RCA lesion  Stent  Lesion length: 12 mm. CATH LAUNCHER [id number] guide catheter was inserted. Lesion crossed with guidewire using a WIRE  RUNTHROUGH .[id number]. Pre-stent angioplasty was performed using a BALLN TREK RX 3.0X12. Maximum pressure: 12 atm. Inflation time: 20 sec. A drug-eluting stent was successfully placed using a STENT ONYX FRONTIER 4.0X15. Maximum pressure: 14 atm. Inflation time: 20 sec. Post-stent angioplasty was performed using a BALLN [place] RX 4.0X12. Maximum pressure: 18 atm. Inflation time: 20 sec. After initial balloon angioplasty with a 3 x 12 mm balloon, there was significant waste and the lesion was not fully dilated. Thus, I elected to proceed with intravascular lithotripsy and I used a 3.5 x 12 mm balloon which was inflated to 4 atm multiple times and a total of 50 pulses of treatment were given. In spite of that, the balloon was not fully inflated. I elected to use a 3.5 x 15 mm core flex  balloon which was inflated to 16 atm with significant improvement. The lesion was almost fully inflated with this and thus I elected to proceed with stent placement which was postdilated with a 4 mm noncompliant balloon to high pressure with excellent results.  Post-Intervention Lesion Assessment  The intervention was successful. Pre-interventional TIMI flow is 3. Post-intervention TIMI flow is 3. No complications occurred at this lesion.  There is a 0% residual stenosis post intervention.     Left Heart  Left Ventricle The left ventricular size is normal. The left ventricular systolic function is normal. LV end diastolic pressure is mildly elevated. The left ventricular ejection fraction is 55-65% by visual estimate. No regional wall motion abnormalities.   Coronary Diagrams  Diagnostic Dominance: Right Intervention   _____________  Disposition   Pt is being discharged home today in good condition.  Follow-up Plans & Appointments     Follow-up Information     Kathlen Mody, Cadence H, PA-C Follow up on 05/21/2021.   Specialty: Cardiology Why: Appointment time: 2:50 PM Contact information: [address] [place] [address] [phone]                Discharge Instructions     AMB Referral to Cardiac Rehabilitation - Phase II   Complete by: As directed    Diagnosis:  Coronary Stents Stable Angina     After initial evaluation and assessments completed: Virtual Based Care may be provided alone or in conjunction with Phase 2 Cardiac Rehab based on patient barriers.: Yes   Diet - low sodium heart healthy   Complete by: As directed    Increase activity slowly   Complete by: As directed        Discharge Medications   Allergies as of 05/06/2021       Reactions   Codeine Itching   Erythromycin Other (See Comments)   Stomach cramps   Nitrofurantoin Other (See Comments)   Scarring of lung per pt   Penicillins Rash   Has patient had a PCN reaction causing immediate rash, facial/tongue/throat swelling, SOB or lightheadedness with hypotension: Yes Has patient had a PCN reaction causing severe rash involving mucus membranes or skin necrosis: Unknown Has patient had a PCN reaction that required hospitalization No Has patient had a PCN reaction occurring within the last 10 years: No If all of the above answers are "NO", then may proceed with Cephalosporin use.        Medication List     STOP taking these medications    cephALEXin 250 MG capsule Commonly known as: Keflex   etodolac 400 MG tablet Commonly known as: LODINE   methylPREDNISolone 4 MG Tbpk tablet Commonly known as: MEDROL DOSEPAK   naproxen sodium 220 MG tablet Commonly known as: ALEVE   omeprazole 40 MG capsule Commonly known as: PRILOSEC Replaced by: pantoprazole 40 MG tablet       TAKE these medications    albuterol 108 (90 Base) MCG/ACT inhaler Commonly known as: VENTOLIN HFA 2 puffs Q4H PRN   amLODipine 10 MG tablet Commonly known as: NORVASC Take 10 mg by mouth at bedtime.   ascorbic acid 500 MG tablet Commonly known as: VITAMIN C Take 500 mg by mouth daily.   aspirin  81 MG chewable tablet Chew 1 tablet (81 mg total) by mouth daily. Start taking on: May 07, 2021   budesonide 0.25 MG/2ML nebulizer solution Commonly known as: PULMICORT Take 2 mLs (0.25 mg total) by nebulization 2 (two) times daily.  calcium citrate-vitamin D 315-200 MG-UNIT tablet Commonly known as: CITRACAL+D Take 1 tablet by mouth 2 (two) times daily.   carvedilol 12.5 MG tablet Commonly known as: COREG Take 1 tablet (12.5 mg total) by mouth 2 (two) times daily. What changed:  medication strength how much to take   citalopram 20 MG tablet Commonly known as: CELEXA Take by mouth.   clopidogrel 75 MG tablet Commonly known as: PLAVIX Take 1 tablet (75 mg total) by mouth daily with breakfast. Start taking on: May 07, 2021   diphenhydrAMINE 25 MG tablet Commonly known as: BENADRYL Take 75 mg by mouth daily as needed for allergies.   diphenoxylate-atropine 2.5-0.025 MG tablet Commonly known as: LOMOTIL Take 1 tablet by mouth 2 (two) times daily as needed for diarrhea or loose stools.   fluticasone 50 MCG/ACT nasal spray Commonly known as: FLONASE Place 2 sprays into both nostrils 2 (two) times daily.   furosemide 20 MG tablet Commonly known as: LASIX Take 20 mg by mouth daily.   glipiZIDE 5 MG 24 hr tablet Commonly known as: GLUCOTROL XL Take 5 mg by mouth daily with breakfast.   hydrALAZINE 50 MG tablet Commonly known as: APRESOLINE Take 1 tablet (50 mg total) by mouth 3 (three) times daily.   hydrOXYzine 10 MG tablet Commonly known as: ATARAX Take 10 mg by mouth every 8 (eight) hours as needed for itching.   losartan 100 MG tablet Commonly known as: COZAAR Take 100 mg by mouth at bedtime.   Melatonin 10 MG Caps Take 20 mg by mouth at bedtime.   ondansetron 4 MG disintegrating tablet Commonly known as: ZOFRAN-ODT Take 1 tablet (4 mg total) by mouth every 8 (eight) hours as needed for nausea or vomiting.   pantoprazole 40 MG tablet Commonly known  as: PROTONIX Take 1 tablet (40 mg total) by mouth every evening. Replaces: omeprazole 40 MG capsule   Perforomist 20 MCG/2ML nebulizer solution Generic drug: formoterol USE 1 VIAL VIA NEBULIZER AND INHALE BY MOUTH 2 TIMES A DAY   PROBIOTIC BLEND PO Take 1 capsule by mouth daily.   revefenacin 175 MCG/3ML nebulizer solution Commonly known as: YUPELRI Take 3 mLs (175 mcg total) by nebulization daily.   rosuvastatin 20 MG tablet Commonly known as: CRESTOR Take 1 tablet (20 mg total) by mouth at bedtime. What changed:  medication strength how much to take   traMADol 50 MG tablet Commonly known as: ULTRAM Take 50 mg by mouth 3 (three) times daily as needed for moderate pain.   traZODone 150 MG tablet Commonly known as: DESYREL Take 150 mg by mouth at bedtime.   VITAMIN B 12 PO Take 1 tablet by mouth daily.   Vitamin D3 25 MCG (1000 UT) Caps Take by mouth daily.   WOMENS MULTIVITAMIN PO Take 1 tablet by mouth daily.        Aspirin prescribed at discharge?  Yes High Intensity Statin Prescribed? (Lipitor 40-80mg  or Crestor 20-40mg ): Yes Beta Blocker Prescribed? Yes For EF <40%, was ACEI/ARB Prescribed? Yes: EF > 40% ADP Receptor Inhibitor Prescribed? (i.e. Plavix etc.-Includes Medically Managed Patients): Yes For EF <40%, Aldosterone Inhibitor Prescribed? No: EF > 40% Was EF assessed during THIS hospitalization? Yes Was Cardiac Rehab II ordered? (Included Medically managed Patients): Yes   Outstanding Labs/Studies   None  Duration of Discharge Encounter   Greater than 30 minutes including physician time.  Signed, Rise Mu, PA-C [facility] Pager: [phone] 05/06/2021, 1:43 PM

## 2021-05-06 NOTE — Progress Notes (Signed)
Walked with her to door way from the far corner without any oxygen, her O2 was 89-91. no SOB, NO pain, Tiffany Faith, PA notified.

## 2021-05-06 NOTE — Progress Notes (Signed)
°  Transition of Care [facility]) Screening Note   Patient Details  Name: Tiffany Alexander Date of Birth: 05-Nov-1945   Transition of Care [facility]) CM/SW Contact:    Alberteen Sam, LCSW Phone Number: 05/06/2021, 2:18 PM    Transition of Care Department [facility]) has reviewed patient and no TOC needs have been identified at this time. We will continue to monitor patient advancement through interdisciplinary progression rounds. If new patient transition needs arise, please place a TOC consult.  [place], [place]

## 2021-05-06 NOTE — Progress Notes (Signed)
Patient walk to bathroom with 1 person assistant and 1 liter of oxygen. No C/O SOB or pian during walk.

## 2021-05-12 ENCOUNTER — Encounter: Payer: Medicare HMO | Admitting: *Deleted

## 2021-05-12 NOTE — Progress Notes (Signed)
Tiffany Alexander has decided she does not want to start the program. She currently has a lot of stress concerns and this program would add to those. She was informed that if she were to change her mind, to tell her doctor and they can send a new referral.  ?

## 2021-05-13 ENCOUNTER — Encounter: Payer: Self-pay | Admitting: Pulmonary Disease

## 2021-05-13 ENCOUNTER — Ambulatory Visit: Payer: Medicare HMO | Admitting: Pulmonary Disease

## 2021-05-13 ENCOUNTER — Other Ambulatory Visit: Payer: Self-pay

## 2021-05-13 ENCOUNTER — Telehealth: Payer: Self-pay | Admitting: Cardiovascular Disease

## 2021-05-13 VITALS — BP 140/80 | HR 73 | Temp 97.3°F | Ht 62.5 in | Wt 205.6 lb

## 2021-05-13 DIAGNOSIS — J4541 Moderate persistent asthma with (acute) exacerbation: Secondary | ICD-10-CM | POA: Diagnosis not present

## 2021-05-13 DIAGNOSIS — R0602 Shortness of breath: Secondary | ICD-10-CM

## 2021-05-13 DIAGNOSIS — I25118 Atherosclerotic heart disease of native coronary artery with other forms of angina pectoris: Secondary | ICD-10-CM | POA: Diagnosis not present

## 2021-05-13 MED ORDER — HYDRALAZINE HCL 50 MG PO TABS: 100.0000 mg | ORAL_TABLET | Freq: Three times a day (TID) | ORAL | 6 refills | Status: DC

## 2021-05-13 NOTE — Telephone Encounter (Signed)
Left voicemail message to call back  

## 2021-05-13 NOTE — Telephone Encounter (Signed)
Spoke with patients daughter per release form. She reports patient has been having elevated blood pressures since medication changes, headaches which is not normal for her, and confusion which is new. Blood pressures in previous note. Daughter is concerned given her symptoms that are new and she confirmed appointment for next week. She states that they saw Dr. Patsey Berthold today and was advised to give [place] a call. Advised I would send over to provider for review and would give her a call back with recommendations. She verbalized understanding and was agreeable with plan.  ?

## 2021-05-13 NOTE — Telephone Encounter (Signed)
Reviewed recommendations from provider to increase medication and continue monitoring her blood pressures. Also reviewed recommendations to please call back if blood pressures remain elevated. She verbalized understanding, agreement with plan, and had no further questions at this time.  ? ? ? ?Verbal discussion with Cadence Furth PA-C regarding blood pressures, headaches, and confusion. We reviewed medication changes that were made and concerns from patients daughter. Verbal orders were to have patient continue current medications and increase hydralazine to 100 mg three times a day and have her keep upcoming appointment next week.  ? ? ? ? ?Tiffany Alexander, Cadence H, PA-C  ?Sent: Tue May 13, 2021  2:56 PM  ? ? ?Message ? ?Bps appear elevated still despite addition of Coreg. Unsure if these are from [place]. Confusion can be from other non-cardiac issues. OK to stop medication to see if there is improvement of symptoms.   ? ?

## 2021-05-13 NOTE — Patient Instructions (Signed)
Continue your Perforomist and Pulmicort (budesonide) twice a day. ? ?We will see you in follow-up in 3 months time call sooner should any new problems arise. ?

## 2021-05-13 NOTE — Telephone Encounter (Signed)
Pt c/o BP issue: STAT if pt c/o blurred vision, one-sided weakness or slurred speech ? ?1. What are your last 5 BP readings? 2/28 169/68, 3/1 163/68, 3/3 142/66, 3/4 161/69, 3/5 167/65, 3/6 150/77 ? ?2. Are you having any other symptoms (ex. Dizziness, headache, blurred vision, passed out)? Headaches ? ?3. What is your BP issue? Started Coreg 12.5 noticed when this started. ? ? ?

## 2021-05-13 NOTE — Telephone Encounter (Signed)
Patient returning call.

## 2021-05-13 NOTE — Progress Notes (Signed)
Subjective:    Patient ID: Tiffany Alexander, female    DOB: December 09, 1945, 76 y.o.   MRN: [id number]  Patient Care Team: [place] Pink, MD as PCP - General (Family Medicine) Wellington Hampshire, MD as Consulting Physician (Cardiology)  Chief Complaint  Patient presents with   Follow-up   HPI 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  Was last seen on 31 March 2021.  This is a scheduled visit.  She follows here for the issue of her asthmatic bronchitis and fatigue on exertion.  At her prior visit she had been complaining of increasing dyspnea since around September with no worsening on her pulmonary function and dyspnea/fatigue out of proportion to her lung function.  She was referred to cardiology given evidence of coronary calcification on CT and persistent issues with dyspnea.  She was noted to have severe RCA  90% occlusion.  He underwent PCI to this vessel and since then has noted marked improvement on her shortness of breath.  She feels "like a new woman".  She is on Pulmicort and Perforomist (formoterol) via nebulizer as well as needed albuterol.  She notes that she hardly has to use her albuterol at all. She has issues with akathisia which is chronic he has been referred to neurology.  She has not had any fevers, chills or sweats.  No increased cough or sputum production.  No hemoptysis.  Overall she feels well and looks well.  DATA 07/08/17 PFTs: FVC 2.61 L, 98% predicted.  FEV1 2.17 L, 106% predicted.  FEV1 ratio 83%.  TLC 77% predicted.  DLCO 71% predicted.  DLCO/VA 125% predicted 08/03/17 HRCT chest: Very mild nonspecific interstitial changes with basilar predominance.  Multiple tiny pulmonary nodules scattered throughout the lungs bilaterally measuring 5 mm or less in size, nonspecific but statistically likely benign.  01/04/18 PFTs: FVC: 2.56 > 2.63 L (97 > 99 %pred), FEV1:  2.08 > 2.13 L (102 > 105 %pred), FEV1/FVC: 81%, TLC: 4.29 L (87 %pred), DLCO 87 %pred 11/08/2018: [place] allergen profile was negative, total IgE not done.  CBC with differential showed no eosinophilia. 08/24/2019 PFTs: FEV1 1.88 L or 93% predicted, FEV1/FVC 86%.  No bronchodilator response. Volumes normal with the exception of low ERV consistent with obesity.  Patient capacity normal. Overall stable study when compared to previous. 12/19/2020 PFTs: FEV1 1.89 L or 97% predicted, FVC 2.28 L or 87% predicted, FEV1/FVC 74%, no bronchodilator response.  Lung volumes normal, ERV low consistent with obesity.  Diffusion capacity normal.  Overall stable study. 12/27/2020 high resolution CT: Fibrotic changes that remain extremely mild at this time, multiple small stable 5 mm nodules.  Coronary calcifications 01/09/2021 echocardiogram: LVEF 60 to 65% grade 1 DD, normal right ventricular function, no pulmonary hypertension.  Aortic sclerosis without stenosis 05/05/2021 cardiac catheterization: Severe one-vessel coronary artery disease with 90% heavily calcified ostial right coronary artery stenosis, mild to moderate disease affecting the left coronary artery system.  Normal LV function and end-diastolic pressure.  Patient had complex angioplasty and drug-eluting stent placement to the ostial right coronary artery.  Review of Systems A 10 point review of systems was performed and it is as noted above otherwise negative.  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[place]) 05/05/2021   Lymphedema 09/25/2020   Chronic venous insufficiency 09/25/2020   Recurrent urinary tract infection 11/25/2017   Coronary artery disease involving native coronary  artery of native heart 08/24/2017   Hyperlipidemia, mixed 08/24/2017   SOBOE (shortness of breath on exertion) 08/24/2017   Positive ANA (antinuclear antibody) 08/20/2017   Pulmonary fibrosis ([place]) 08/20/2017   Status post finger joint fusion  07/16/2014   Allergic rhinitis 05/01/2013   Diabetes ([place]) 05/01/2013   HTN (hypertension) 05/01/2013   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   Lower urinary tract infectious disease 04/05/2013   Prolapse of female pelvic organs 11/04/2012   Urge incontinence 06/27/2012   Chronic cystitis 01/05/2012   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/05/2012   Incomplete emptying of bladder 01/05/2012   Social History   Tobacco Use   Smoking status: Never   Smokeless tobacco: Never  Substance Use Topics   Alcohol use: No   Allergies  Allergen Reactions   Codeine Itching   Erythromycin Other (See Comments)    Stomach cramps   Nitrofurantoin Other (See Comments)    Scarring of lung per pt   Penicillins Rash    Has patient had a PCN reaction causing immediate rash, facial/tongue/throat swelling, SOB or lightheadedness with hypotension: Yes Has patient had a PCN reaction causing severe rash involving mucus membranes or skin necrosis: Unknown Has patient had a PCN reaction that required hospitalization No Has patient had a PCN reaction occurring within the last 10 years: No If all of the above answers are "NO", then may proceed with Cephalosporin use.    Current Meds  Medication Sig   albuterol (VENTOLIN HFA) 108 (90 Base) MCG/ACT inhaler 2 puffs Q4H PRN   amLODipine (NORVASC) 10 MG tablet Take 10 mg by mouth at bedtime.   ascorbic acid (VITAMIN C) 500 MG tablet Take 500 mg by mouth daily.   aspirin 81 MG chewable tablet Chew 1 tablet (81 mg total) by mouth daily.   budesonide (PULMICORT) 0.25 MG/2ML nebulizer solution Take 2 mLs (0.25 mg total) by nebulization 2 (two) times daily.   calcium citrate-vitamin D (CITRACAL+D) 315-200 MG-UNIT tablet Take 1 tablet by mouth 2 (two) times daily.   carvedilol (COREG) 12.5 MG tablet Take 1 tablet (12.5 mg total) by mouth 2 (two) times daily.   Cholecalciferol (VITAMIN D3) 1000 units CAPS Take by mouth daily.   citalopram (CELEXA) 20 MG tablet Take by  mouth.   clopidogrel (PLAVIX) 75 MG tablet Take 1 tablet (75 mg total) by mouth daily with breakfast.   Cyanocobalamin (VITAMIN B 12 PO) Take 1 tablet by mouth daily.   diphenhydrAMINE (BENADRYL) 25 MG tablet Take 75 mg by mouth daily as needed for allergies.   diphenoxylate-atropine (LOMOTIL) 2.5-0.025 MG tablet Take 1 tablet by mouth 2 (two) times daily as needed for diarrhea or loose stools.   fluticasone (FLONASE) 50 MCG/ACT nasal spray Place 2 sprays into both nostrils 2 (two) times daily.   furosemide (LASIX) 20 MG tablet Take 20 mg by mouth daily.   glipiZIDE (GLUCOTROL XL) 5 MG 24 hr tablet Take 5 mg by mouth daily with breakfast.   hydrALAZINE (APRESOLINE) 50 MG tablet Take 1 tablet (50 mg total) by mouth 3 (three) times daily.   hydrOXYzine (ATARAX/VISTARIL) 10 MG tablet Take 10 mg by mouth every 8 (eight) hours as needed for itching.   Lansoprazole (PREVACID PO) Take by mouth.   losartan (COZAAR) 100 MG tablet Take 100 mg by mouth at bedtime.   Melatonin 10 MG CAPS Take 20 mg by mouth at bedtime.   Multiple Vitamins-Minerals (WOMENS MULTIVITAMIN PO) Take 1 tablet by mouth daily.   ondansetron (ZOFRAN-ODT) 4 MG  disintegrating tablet Take 1 tablet (4 mg total) by mouth every 8 (eight) hours as needed for nausea or vomiting.   PERFOROMIST 20 MCG/2ML nebulizer solution USE 1 VIAL VIA NEBULIZER AND INHALE BY MOUTH 2 TIMES A DAY   Probiotic Product (PROBIOTIC BLEND PO) Take 1 capsule by mouth daily.   revefenacin (YUPELRI) 175 MCG/3ML nebulizer solution Take 3 mLs (175 mcg total) by nebulization daily.   rosuvastatin (CRESTOR) 20 MG tablet Take 1 tablet (20 mg total) by mouth at bedtime.   traMADol (ULTRAM) 50 MG tablet Take 50 mg by mouth 3 (three) times daily as needed for moderate pain.   traZODone (DESYREL) 150 MG tablet Take 150 mg by mouth at bedtime.   [DISCONTINUED] pantoprazole (PROTONIX) 40 MG tablet Take 1 tablet (40 mg total) by mouth every evening.   Immunization History   Administered Date(s) Administered   Influenza, High Dose Seasonal PF 12/25/2014, 12/21/2016, 11/25/2017, 12/17/2018   PFIZER Comirnaty(Gray Top)Covid-19 Tri-Sucrose Vaccine 03/27/2019, 04/17/2019   PFIZER(Purple Top)SARS-COV-2 Vaccination 03/27/2019, 04/17/2019   Pneumococcal Conjugate-13 12/25/2014   Pneumococcal Polysaccharide-23 08/17/2017       Objective:   Physical Exam BP 140/80 (BP Location: Left Arm, Patient Position: Sitting, Cuff Size: Normal)   Pulse 73   Temp (!) 97.3 F (36.3 C) (Oral)   Ht 5' 2.5" (1.588 m)   Wt 205 lb 9.6 oz (93.3 kg)   SpO2 94%   BMI 37.01 kg/m  GENERAL: Awake, alert, no respiratory distress.  Akathisia noted.  She presents in transport chair due to complaints of  back pain. HEAD: Normocephalic, atraumatic.  EYES: Pupils equal, round, reactive to light.  No scleral icterus.  MOUTH: Nose/mouth/throat not examined due to masking requirements for COVID 19. NECK: Supple. No thyromegaly. Trachea midline. No JVD.  No adenopathy.  PULMONARY: Excellent air entry bilaterally.  Lungs clear to auscultation bilaterally.  No wheezes. CARDIOVASCULAR: S1 and S2. Regular rate and rhythm.  No rubs, murmurs or gallops heard. GASTROINTESTINAL: Obese abdomen, soft, nondistended. MUSCULOSKELETAL: No joint deformity, no clubbing, no edema.  NEUROLOGIC: No overt focal deficit.  Involuntary athetoid type movements.  No tremor.  Speech is fluent. SKIN: Intact,warm,dry.  On limited exam no rashes. PSYCH: Mood and behavior normal.    Assessment & Plan:     ICD-10-CM   1. Moderate persistent asthma with acute exacerbation  J45.41    Continue Perforomist and budesonide Continue as needed albuterol    2. SOB (shortness of breath)  R06.02    Marked improvement after PCI to RCA    3. Coronary artery disease of native artery of native heart with stable angina pectoris (HCC)  I25.118    Shortness of breath was angina variant Patient had PCI to RCA Follows with  cardiology      Overall Ms. Warchol is doing markedly better after her intervention to the RCA.  She is to continue her Perforomist and budesonide twice daily.  We will see her in follow-up in 3 months time she is to call sooner should any new problems arise.  [name], MD Advanced Bronchoscopy PCCM [facility]    *This note was dictated using voice recognition software/Dragon.  Despite best efforts to proofread, errors can occur which can change the meaning. Any transcriptional errors that result from this process are unintentional and may not be fully corrected at the time of dictation.

## 2021-05-14 ENCOUNTER — Encounter: Payer: Medicare HMO | Attending: Cardiovascular Disease | Admitting: *Deleted

## 2021-05-14 DIAGNOSIS — E119 Type 2 diabetes mellitus without complications: Secondary | ICD-10-CM | POA: Insufficient documentation

## 2021-05-14 DIAGNOSIS — Z48812 Encounter for surgical aftercare following surgery on the circulatory system: Secondary | ICD-10-CM | POA: Insufficient documentation

## 2021-05-14 DIAGNOSIS — Z955 Presence of coronary angioplasty implant and graft: Secondary | ICD-10-CM | POA: Insufficient documentation

## 2021-05-14 NOTE — Progress Notes (Signed)
Initial telephone orientation completed. Diagnosis can be found in [facility] 2/27. EP orientation scheduled for Thursday 3/16 at 8am. ?

## 2021-05-21 ENCOUNTER — Other Ambulatory Visit: Payer: Self-pay

## 2021-05-21 ENCOUNTER — Ambulatory Visit: Payer: Medicare HMO | Admitting: Medical

## 2021-05-21 ENCOUNTER — Encounter: Payer: Self-pay | Admitting: Medical

## 2021-05-21 VITALS — BP 150/80 | HR 65 | Ht 61.0 in | Wt 205.4 lb

## 2021-05-21 DIAGNOSIS — I10 Essential (primary) hypertension: Secondary | ICD-10-CM

## 2021-05-21 DIAGNOSIS — J8410 Pulmonary fibrosis, unspecified: Secondary | ICD-10-CM

## 2021-05-21 DIAGNOSIS — E1359 Other specified diabetes mellitus with other circulatory complications: Secondary | ICD-10-CM

## 2021-05-21 DIAGNOSIS — E782 Mixed hyperlipidemia: Secondary | ICD-10-CM

## 2021-05-21 DIAGNOSIS — I2510 Atherosclerotic heart disease of native coronary artery without angina pectoris: Secondary | ICD-10-CM

## 2021-05-21 MED ORDER — HYDRALAZINE HCL 50 MG PO TABS: ORAL_TABLET | ORAL | 6 refills | Status: DC

## 2021-05-21 MED ORDER — CARVEDILOL 25 MG PO TABS: 25.0000 mg | ORAL_TABLET | Freq: Two times a day (BID) | ORAL | 3 refills | Status: DC

## 2021-05-21 MED ORDER — CLOPIDOGREL BISULFATE 75 MG PO TABS: 75.0000 mg | ORAL_TABLET | Freq: Every day | ORAL | 3 refills | Status: DC

## 2021-05-21 NOTE — Patient Instructions (Signed)
Medication Instructions:  ?- Your physician has recommended you make the following change in your medication:  ? ?1) INCREASE coreg (carvedilol) to 25 mg: ?- take 1 tablet by mouth TWICE daily (or every 12 hours) ? ?2) Hydralazine 50 mg: ?- take 3 tablets (150 mg) by mouth TWICE daily (or every 12 hours) ? ?3) Plavix (clopidogrel) has been refilled at the pharmacy ? ?*If you need a refill on your cardiac medications before your next appointment, please call your pharmacy* ? ? ?Lab Work: ?- none ordered ? ?If you have labs (blood work) drawn today and your tests are completely normal, you will receive your results only by: ?MyChart Message (if you have MyChart) OR ?A paper copy in the mail ?If you have any lab test that is abnormal or we need to change your treatment, we will call you to review the results. ? ? ?Testing/Procedures: ?- none ordered ? ? ?Follow-Up: ?At [facility], you and your health needs are our priority.  As part of our continuing mission to provide you with exceptional heart care, we have created designated Provider Care Teams.  These Care Teams include your primary Cardiologist (physician) and Advanced Practice Providers (APPs -  Physician Assistants and Nurse Practitioners) who all work together to provide you with the care you need, when you need it. ? ?We recommend signing up for the patient portal called "MyChart".  Sign up information is provided on this After Visit Summary.  MyChart is used to connect with patients for Virtual Visits (Telemedicine).  Patients are able to view lab/test results, encounter notes, upcoming appointments, etc.  Non-urgent messages can be sent to your provider as well.   ?To learn more about what you can do with MyChart, go to [email or web address].   ? ?Your next appointment:   ?1 month(s) ? ?The format for your next appointment:   ?In Person ? ?Provider:   ?You may see Ida Rogue, MD or one of the following Advanced Practice Providers on your designated  Care Team:   ? ?Cadence Kathlen Mody, PA-C  ? ? ?Other Instructions ? ?1) Please monitor your blood pressure & heart rate readings at home ? ? ? ?How to Take Your Blood Pressure ?Blood pressure measures how strongly your blood is pressing against the walls of your arteries. Arteries are blood vessels that carry blood from your heart throughout your body. You can take your blood pressure at home with a machine. ?You may need to check your blood pressure at home: ?To check if you have high blood pressure (hypertension). ?To check your blood pressure over time. ?To make sure your blood pressure medicine is working. ?Supplies needed: ?Blood pressure machine, or monitor. ?Dining room chair to sit in. ?Table or desk. ?Small notebook. ?Pencil or pen. ?How to prepare ?Avoid these things for 30 minutes before checking your blood pressure: ?Having drinks with caffeine in them, such as coffee or tea. ?Drinking alcohol. ?Eating. ?Smoking. ?Exercising. ?Do these things five minutes before checking your blood pressure: ?Go to the bathroom and pee (urinate). ?Sit in a dining chair. Do not sit on a soft couch or an armchair. ?Be quiet. Do not talk. ?How to take your blood pressure ?Follow the instructions that came with your machine. If you have a digital blood pressure monitor, these may be the instructions: ?Sit up straight. ?Place your feet on the floor. Do not cross your ankles or legs. ?Rest your left arm at the level of your heart. You may rest it on a  table, desk, or chair. ?Pull up your shirt sleeve. ?Wrap the blood pressure cuff around the upper part of your left arm. The cuff should be 1 inch (2.5 cm) above your elbow. It is best to wrap the cuff around bare skin. ?Fit the cuff snugly around your arm. You should be able to place only one finger between the cuff and your arm. ?Place the cord so that it rests in the bend of your elbow. ?Press the power button. ?Sit quietly while the cuff fills with air and loses air. ?Write down  the numbers on the screen. ?Wait 2-3 minutes and then repeat steps 1-10. ?What do the numbers mean? ?Two numbers make up your blood pressure. The first number is called systolic pressure. The second is called diastolic pressure. An example of a blood pressure reading is "120 over 80" (or 120/80). ?If you are an adult and do not have a medical condition, use this guide to find out if your blood pressure is normal: ?Normal ?First number: below 120. ?Second number: below 80. ?Elevated ?First number: 161-096. ?Second number: below 80. ?Hypertension stage 1 ?First number: 130-139. ?Second number: 80-89. ?Hypertension stage 2 ?First number: 140 or above. ?Second number: 40 or above. ?Your blood pressure is above normal even if only the first or only the second number is above normal. ?Follow these instructions at home: ?Medicines ?Take over-the-counter and prescription medicines only as told by your doctor. ?Tell your doctor if your medicine is causing side effects. ?General instructions ?Check your blood pressure as often as your doctor tells you to. ?Check your blood pressure at the same time every day. ?Take your monitor to your next doctor's appointment. Your doctor will: ?Make sure you are using it correctly. ?Make sure it is working right. ?Understand what your blood pressure numbers should be. ?Keep all follow-up visits as told by your doctor. This is important. ?General tips ?You will need a blood pressure machine, or monitor. Your doctor can suggest a monitor. You can buy one at a drugstore or online. When choosing one: ?Choose one with an arm cuff. ?Choose one that wraps around your upper arm. Only one finger should fit between your arm and the cuff. ?Do not choose one that measures your blood pressure from your wrist or finger. ?Where to find more information ?American Heart Association: www.heart.org ?Contact a doctor if: ?Your blood pressure keeps being high. ?Your blood pressure is suddenly low. ?Get help  right away if: ?Your first blood pressure number is higher than 180. ?Your second blood pressure number is higher than 120. ?Summary ?Check your blood pressure at the same time every day. ?Avoid caffeine, alcohol, smoking, and exercise for 30 minutes before checking your blood pressure. ?Make sure you understand what your blood pressure numbers should be. ?This information is not intended to replace advice given to you by your health care provider. Make sure you discuss any questions you have with your health care provider. ?Document Revised: 01/03/2020 Document Reviewed: 02/17/2019 ?Elsevier Patient Education ? 2022 [place]. ? ? ?

## 2021-05-21 NOTE — Progress Notes (Signed)
?Cardiology Office Note:   ? ?Date:  05/21/2021  ? ?ID:  Tiffany Alexander, DOB 1945/09/26, MRN [id number] ? ?PCP:  Maryland Pink, MD  ?[facility] Cardiologist:  None  ?[place] Electrophysiologist:  None  ? ?Referring MD: Maryland Pink, MD  ? ?Chief Complaint: post cath follow-up ? ?History of Present Illness:   ? ?Tiffany Alexander is a 76 y.o. female with a hx of CAD, DM 2, HTN, HLD, asthma, and mild pulmonary fibrosis who was recently seen in the office for post-cath follow-up.  ?  ?She was previously followed by Dr. Nehemiah Massed, with [facility] cardiology, with echo in 09/2017 demonstrating a preserved LV systolic function.  Nuclear stress testing at that time showed no evidence of ischemia.  CT chest showed multivessel coronary artery calcification and aortic atherosclerosis.  ? ?She is followed by pulmonology for asthma and mild pulmonary fibrosis.  She underwent echo in 01/2021, due to progressive dyspnea, that showed an EF of 60 to 65%, no regional wall motion abnormalities, grade 1 diastolic dysfunction, normal RV systolic function and ventricular cavity size, mildly calcified aortic valve without evidence of stenosis, and an estimated right atrial pressure of 3 mmHg.  ? ?She was initially evaluated by Dr. Rockey Situ on 04/15/2021, at the request of pulmonology dyspnea and bradycardia with coronary artery calcification noted on prior imaging.  At that time, it was noted the patient had progressive dyspnea over the prior several months.  Subsequent coronary CTA on 04/24/2021 demonstrated 25% proximal LAD stenosis, less than 25% proximal LCx stenosis, severe ostial RCA stenosis at greater than 70%.  ctFFR reported as normal.  She followed up on 05/01/2021 with continued dyspnea.  Given ongoing symptoms, she was scheduled for diagnostic LHC.  ? ?She underwetn LHC showing severe 1V CAD with 90% heavily calcified ostial RCA stenosis with mild to moderate disease affecting the left coronary tree.  There was normal LV  systolic function and mildly elevated LVEDP.  She underwent successful complex angioplasty and drug-eluting stent placement to the ostial RCA using intravascular orthotripsy and flex score cutting balloon.  The stent extended 1 to 2 mm into the aorta.  Overall this was a difficult procedure.  DAPT was recommended for a minimum of 6 months.  On the evening following her cath, she had 2 episodes of emesis along with a headache so she was kept overnight.  She was stable and next day for discharge. ? ?Today, the patient reports she has been ok since the cath. After the procedure she felt fatigue for about a week. This week she is getting around better, overall energy is better. Feels breathing is better, still SOB at times. Left brachial cath site healed well. She is walking around the house. No LLE, orhthopnea, pnd. She is on O2 at night. She starts cardiac rehab tomorrow. She has concerns regarding BP. She is on amlodipine, Coreg and hydralazine.  ?  ? ?Past Medical History:  ?Diagnosis Date  ? Arthritis   ? osteoarthritis  ? Carpal tunnel syndrome of left wrist   ? Depression   ? Diabetes mellitus without complication ([place])   ? Diverticulosis   ? GERD (gastroesophageal reflux disease)   ? Hypertension   ? PONV (postoperative nausea and vomiting)   ? PONV (postoperative nausea and vomiting)   ? Pulmonary fibrosis ([place])   ? Recurrent UTI   ? SUI (stress urinary incontinence, female)   ? Wears dentures   ? full upper and lower  ? ? ?Past Surgical History:  ?  Procedure Laterality Date  ?  endocervical curettage    ? ABDOMINAL HYSTERECTOMY    ? BLADDER SUSPENSION    ? CARPAL TUNNEL RELEASE Left   ? CARPAL TUNNEL RELEASE Right 05/14/2015  ? Procedure: CARPAL TUNNEL RELEASE;  Surgeon: Hessie Knows, MD;  Location: [facility] ORS;  Service: Orthopedics;  Laterality: Right;  ? CATARACT EXTRACTION W/PHACO Right 12/17/2015  ? Procedure: CATARACT EXTRACTION PHACO AND INTRAOCULAR LENS PLACEMENT (IOC);  Surgeon: Birder Robson, MD;   Location: [facility] ORS;  Service: Ophthalmology;  Laterality: Right;  [place] 52.0 ?AP% 15.4 ?CDE 7.99 ?Fluid pack lot # [id number]  ? CATARACT EXTRACTION W/PHACO Left 01/21/2016  ? Procedure: CATARACT EXTRACTION PHACO AND INTRAOCULAR LENS PLACEMENT (IOC);  Surgeon: Birder Robson, MD;  Location: [facility] ORS;  Service: Ophthalmology;  Laterality: Left;  [place] 50.4 ?AP% 19.6 ?CDE 9.92 ?FLUID PACK LOT # [id number]  ? CESAREAN SECTION N/A   ? COLONOSCOPY N/A 08/24/2014  ? Procedure: COLONOSCOPY;  Surgeon: Lollie Sails, MD;  Location: [facility] ENDOSCOPY;  Service: Endoscopy;  Laterality: N/A;  ? COLONOSCOPY N/A 03/13/2020  ? Procedure: COLONOSCOPY;  Surgeon: Toledo, Benay Pike, MD;  Location: [facility] ENDOSCOPY;  Service: Gastroenterology;  Laterality: N/A;  ? COLONOSCOPY WITH PROPOFOL N/A 04/05/2015  ? Procedure: COLONOSCOPY WITH PROPOFOL;  Surgeon: Lollie Sails, MD;  Location: [facility] ENDOSCOPY;  Service: Endoscopy;  Laterality: N/A;  ? COLPORRHAPHY    ? CORONARY STENT INTERVENTION N/A 05/05/2021  ? Procedure: CORONARY STENT INTERVENTION;  Surgeon: Wellington Hampshire, MD;  Location: [place] CV LAB;  Service: Cardiovascular;  Laterality: N/A;  ? EYE SURGERY    ? FINGER ARTHRODESIS Right 07/12/2014  ? Procedure: Right middle finger DIP fussion;  Surgeon: Hessie Knows, MD;  Location: [facility] ORS;  Service: Orthopedics;  Laterality: Right;  ? FRACTURE SURGERY    ? fractured wrist Right   ? HAMMER TOE SURGERY Left 04/01/2016  ? Procedure: HAMMER TOE CORRECTION  Left 2nd  & 3rd toe;  Surgeon: [name], DPM;  Location: [place];  Service: Podiatry;  Laterality: Left;  Special:  Hammer lock implants ?Left 2nd & 3rd ?IVA LOcal ?Diabetic - oral meds  ? LEFT HEART CATH AND CORONARY ANGIOGRAPHY Left 05/05/2021  ? Procedure: LEFT HEART CATH AND CORONARY ANGIOGRAPHY;  Surgeon: Wellington Hampshire, MD;  Location: [place] CV LAB;  Service: Cardiovascular;  Laterality: Left;  ? OPEN REDUCTION INTERNAL FIXATION (ORIF) METACARPAL Left  05/14/2015  ? Procedure: OPEN REDUCTION INTERNAL FIXATION (ORIF) METACARPAL;  Surgeon: Hessie Knows, MD;  Location: [facility] ORS;  Service: Orthopedics;  Laterality: Left;  ? SEPTOPLASTY    ? TUBAL LIGATION    ? ? ?Current Medications: ?Current Meds  ?Medication Sig  ? albuterol (VENTOLIN HFA) 108 (90 Base) MCG/ACT inhaler 2 puffs Q4H PRN  ? amLODipine (NORVASC) 10 MG tablet Take 10 mg by mouth at bedtime.  ? ascorbic acid (VITAMIN C) 500 MG tablet Take 500 mg by mouth daily.  ? aspirin 81 MG chewable tablet Chew 1 tablet (81 mg total) by mouth daily.  ? budesonide (PULMICORT) 0.25 MG/2ML nebulizer solution Take 2 mLs (0.25 mg total) by nebulization 2 (two) times daily.  ? calcium citrate-vitamin D (CITRACAL+D) 315-200 MG-UNIT tablet Take 1 tablet by mouth 2 (two) times daily.  ? carvedilol (COREG) 25 MG tablet Take 1 tablet (25 mg total) by mouth 2 (two) times daily.  ? Cholecalciferol (VITAMIN D3) 1000 units CAPS Take by mouth daily.  ? citalopram (CELEXA) 20 MG tablet Take by mouth.  ?  Cyanocobalamin (VITAMIN B 12 PO) Take 1 tablet by mouth daily.  ? diphenhydrAMINE (BENADRYL) 25 MG tablet Take 75 mg by mouth daily as needed for allergies.  ? diphenoxylate-atropine (LOMOTIL) 2.5-0.025 MG tablet Take 1 tablet by mouth 2 (two) times daily as needed for diarrhea or loose stools.  ? fluticasone (FLONASE) 50 MCG/ACT nasal spray Place 2 sprays into both nostrils 2 (two) times daily.  ? furosemide (LASIX) 20 MG tablet Take 20 mg by mouth daily.  ? glipiZIDE (GLUCOTROL XL) 5 MG 24 hr tablet Take 5 mg by mouth daily with breakfast.  ? hydrOXYzine (ATARAX/VISTARIL) 10 MG tablet Take 10 mg by mouth every 8 (eight) hours as needed for itching.  ? Lansoprazole (PREVACID PO) Take by mouth.  ? losartan (COZAAR) 100 MG tablet Take 100 mg by mouth at bedtime.  ? Melatonin 10 MG CAPS Take 20 mg by mouth at bedtime.  ? Multiple Vitamins-Minerals (WOMENS MULTIVITAMIN PO) Take 1 tablet by mouth daily.  ? ondansetron (ZOFRAN-ODT) 4 MG  disintegrating tablet Take 1 tablet (4 mg total) by mouth every 8 (eight) hours as needed for nausea or vomiting.  ? PERFOROMIST 20 MCG/2ML nebulizer solution USE 1 VIAL VIA NEBULIZER AND INHALE BY MOUTH 2 TIMES A DAY

## 2021-05-22 VITALS — Ht 62.5 in | Wt 204.7 lb

## 2021-05-22 DIAGNOSIS — E119 Type 2 diabetes mellitus without complications: Secondary | ICD-10-CM | POA: Diagnosis not present

## 2021-05-22 DIAGNOSIS — Z955 Presence of coronary angioplasty implant and graft: Secondary | ICD-10-CM | POA: Diagnosis not present

## 2021-05-22 DIAGNOSIS — Z48812 Encounter for surgical aftercare following surgery on the circulatory system: Secondary | ICD-10-CM | POA: Diagnosis not present

## 2021-05-22 NOTE — Patient Instructions (Signed)
Patient Instructions ? ?Patient Details  ?Name: Tiffany Alexander ?MRN: [id number] ?Date of Birth: [birth year] ?Referring Provider:  Wellington Hampshire, MD ? ?Below are your personal goals for exercise, nutrition, and risk factors. Our goal is to help you stay on track towards obtaining and maintaining these goals. We will be discussing your progress on these goals with you throughout the program. ? ?Initial Exercise Prescription: ? Initial Exercise Prescription - 05/22/21 1400   ? ?  ? Date of Initial Exercise RX and Referring Provider  ? Date 05/22/21   ? Referring Provider Kathlyn Sacramento MD   ?  ? Oxygen  ? Maintain Oxygen Saturation 88% or higher   ?  ? Recumbant Bike  ? Level 1   ? RPM 60   ? Minutes 15   ? METs 1   ?  ? NuStep  ? Level 1   ? SPM 80   ? Minutes 15   ? METs 1   ?  ? Biostep-RELP  ? Level 1   ? SPM 50   ? Minutes 15   ? METs 1   ?  ? Track  ? Laps 5   as tolerated  ? Minutes 15   ? METs 1.27   ?  ? Prescription Details  ? Frequency (times per week) 2   ? Duration Progress to 30 minutes of continuous aerobic without signs/symptoms of physical distress   ?  ? Intensity  ? THRR 40-80% of Max Heartrate 94 - 126   ? Ratings of Perceived Exertion 11-13   ? Perceived Dyspnea 0-4   ?  ? Progression  ? Progression Continue to progress workloads to maintain intensity without signs/symptoms of physical distress.   ?  ? Resistance Training  ? Training Prescription Yes   ? Weight 2 lb   ? Reps 10-15   ? ?  ?  ? ?  ? ? ?Exercise Goals: ?Frequency: Be able to perform aerobic exercise two to three times per week in program working toward 2-5 days per week of home exercise. ? ?Intensity: Work with a perceived exertion of 11 (fairly light) - 15 (hard) while following your exercise prescription.  We will make changes to your prescription with you as you progress through the program. ?  ?Duration: Be able to do 30 to 45 minutes of continuous aerobic exercise in addition to a 5 minute warm-up and a 5 minute cool-down  routine. ?  ?Nutrition Goals: ?Your personal nutrition goals will be established when you do your nutrition analysis with the dietician. ? ?The following are general nutrition guidelines to follow: ?Cholesterol < '200mg'$ /day ?Sodium < '1500mg'$ /day ?Fiber: Women over 50 yrs - 21 grams per day ? ?Personal Goals: ? Personal Goals and Risk Factors at Admission - 05/22/21 1422   ? ?  ? Core Components/Risk Factors/Patient Goals on Admission  ?  Weight Management Yes;Weight Loss   ? Intervention Weight Management: Develop a combined nutrition and exercise program designed to reach desired caloric intake, while maintaining appropriate intake of nutrient and fiber, sodium and fats, and appropriate energy expenditure required for the weight goal.;Weight Management: Provide education and appropriate resources to help participant work on and attain dietary goals.;Weight Management/Obesity: Establish reasonable short term and long term weight goals.   ? Admit Weight 204 lb (92.5 kg)   ? Goal Weight: Short Term 200 lb (90.7 kg)   ? Goal Weight: Long Term 190 lb (86.2 kg)   ? Expected Outcomes Short Term:  Continue to assess and modify interventions until short term weight is achieved;Long Term: Adherence to nutrition and physical activity/exercise program aimed toward attainment of established weight goal;Weight Loss: Understanding of general recommendations for a balanced deficit meal plan, which promotes 1-2 lb weight loss per week and includes a negative energy balance of [phone] kcal/d;Understanding recommendations for meals to include 15-35% energy as protein, 25-35% energy from fat, 35-60% energy from carbohydrates, less than '200mg'$  of dietary cholesterol, 20-35 gm of total fiber daily;Understanding of distribution of calorie intake throughout the day with the consumption of 4-5 meals/snacks   ? Diabetes Yes   ? Intervention Provide education about signs/symptoms and action to take for hypo/hyperglycemia.;Provide education  about proper nutrition, including hydration, and aerobic/resistive exercise prescription along with prescribed medications to achieve blood glucose in normal ranges: Fasting glucose 65-99 mg/dL   ? Expected Outcomes Short Term: Participant verbalizes understanding of the signs/symptoms and immediate care of hyper/hypoglycemia, proper foot care and importance of medication, aerobic/resistive exercise and nutrition plan for blood glucose control.;Long Term: Attainment of HbA1C < 7%.   ? Hypertension Yes   ? Intervention Provide education on lifestyle modifcations including regular physical activity/exercise, weight management, moderate sodium restriction and increased consumption of fresh fruit, vegetables, and low fat dairy, alcohol moderation, and smoking cessation.;Monitor prescription use compliance.   ? Expected Outcomes Short Term: Continued assessment and intervention until BP is < 140/26m HG in hypertensive participants. < 130/874mHG in hypertensive participants with diabetes, heart failure or chronic kidney disease.;Long Term: Maintenance of blood pressure at goal levels.   ? Lipids Yes   ? Intervention Provide education and support for participant on nutrition & aerobic/resistive exercise along with prescribed medications to achieve LDL '70mg'$ , HDL >'40mg'$ .   ? Expected Outcomes Short Term: Participant states understanding of desired cholesterol values and is compliant with medications prescribed. Participant is following exercise prescription and nutrition guidelines.;Long Term: Cholesterol controlled with medications as prescribed, with individualized exercise RX and with personalized nutrition plan. Value goals: LDL < '70mg'$ , HDL > 40 mg.   ? ?  ?  ? ?  ? ? ?Tobacco Use Initial Evaluation: ?Social History  ? ?Tobacco Use  ?Smoking Status Never  ?Smokeless Tobacco Never  ? ? ?Exercise Goals and Review: ? Exercise Goals   ? ? [place] 05/22/21 1422  ?  ?  ?  ?  ?  ? Exercise Goals  ? Increase Physical Activity  Yes      ? Intervention Provide advice, education, support and counseling about physical activity/exercise needs.;Develop an individualized exercise prescription for aerobic and resistive training based on initial evaluation findings, risk stratification, comorbidities and participant's personal goals.      ? Expected Outcomes Short Term: Attend rehab on a regular basis to increase amount of physical activity.;Long Term: Add in home exercise to make exercise part of routine and to increase amount of physical activity.;Long Term: Exercising regularly at least 3-5 days a week.      ? Increase Strength and Stamina Yes      ? Intervention Provide advice, education, support and counseling about physical activity/exercise needs.;Develop an individualized exercise prescription for aerobic and resistive training based on initial evaluation findings, risk stratification, comorbidities and participant's personal goals.      ? Expected Outcomes Short Term: Increase workloads from initial exercise prescription for resistance, speed, and METs.;Short Term: Perform resistance training exercises routinely during rehab and add in resistance training at home;Long Term: Improve cardiorespiratory fitness, muscular endurance and strength as  measured by increased METs and functional capacity (6MWT)      ? Able to understand and use rate of perceived exertion (RPE) scale Yes      ? Intervention Provide education and explanation on how to use RPE scale      ? Expected Outcomes Short Term: Able to use RPE daily in rehab to express subjective intensity level;Long Term:  Able to use RPE to guide intensity level when exercising independently      ? Able to understand and use Dyspnea scale Yes      ? Intervention Provide education and explanation on how to use Dyspnea scale      ? Expected Outcomes Short Term: Able to use Dyspnea scale daily in rehab to express subjective sense of shortness of breath during exertion;Long Term: Able to use  Dyspnea scale to guide intensity level when exercising independently      ? Knowledge and understanding of Target Heart Rate Range (THRR) Yes      ? Intervention Provide education and explanation of THRR includin

## 2021-05-22 NOTE — Progress Notes (Signed)
Cardiac Individual Treatment Plan ? ?Patient Details  ?Name: Tiffany Alexander ?MRN: [id number] ?Date of Birth: [birth year] ?Referring Provider:   ?Flowsheet Row Cardiac Rehab from 05/22/2021 in [facility] Cardiac and Pulmonary Rehab  ?Referring Provider Kathlyn Sacramento MD  ? ?  ? ? ?Initial Encounter Date:  ?Flowsheet Row Cardiac Rehab from 05/22/2021 in [facility] Cardiac and Pulmonary Rehab  ?Date 05/22/21  ? ?  ? ? ?Visit Diagnosis: Status post coronary artery stent placement ? ?Patient's Home Medications on Admission: ? ?Current Outpatient Medications:  ?  albuterol (VENTOLIN HFA) 108 (90 Base) MCG/ACT inhaler, 2 puffs Q4H PRN, Disp: 18 g, Rfl: 0 ?  amLODipine (NORVASC) 10 MG tablet, Take 10 mg by mouth at bedtime., Disp: , Rfl:  ?  ascorbic acid (VITAMIN C) 500 MG tablet, Take 500 mg by mouth daily., Disp: , Rfl:  ?  aspirin 81 MG chewable tablet, Chew 1 tablet (81 mg total) by mouth daily., Disp: 30 tablet, Rfl: 11 ?  budesonide (PULMICORT) 0.25 MG/2ML nebulizer solution, Take 2 mLs (0.25 mg total) by nebulization 2 (two) times daily., Disp: 120 mL, Rfl: 6 ?  calcium citrate-vitamin D (CITRACAL+D) 315-200 MG-UNIT tablet, Take 1 tablet by mouth 2 (two) times daily., Disp: , Rfl:  ?  carvedilol (COREG) 25 MG tablet, Take 1 tablet (25 mg total) by mouth 2 (two) times daily., Disp: 180 tablet, Rfl: 3 ?  Cholecalciferol (VITAMIN D3) 1000 units CAPS, Take by mouth daily., Disp: , Rfl:  ?  citalopram (CELEXA) 20 MG tablet, Take by mouth., Disp: , Rfl:  ?  clopidogrel (PLAVIX) 75 MG tablet, Take 1 tablet (75 mg total) by mouth daily with breakfast., Disp: 90 tablet, Rfl: 3 ?  Cyanocobalamin (VITAMIN B 12 PO), Take 1 tablet by mouth daily., Disp: , Rfl:  ?  diphenhydrAMINE (BENADRYL) 25 MG tablet, Take 75 mg by mouth daily as needed for allergies., Disp: , Rfl:  ?  diphenoxylate-atropine (LOMOTIL) 2.5-0.025 MG tablet, Take 1 tablet by mouth 2 (two) times daily as needed for diarrhea or loose stools., Disp: , Rfl:  ?  fluticasone  (FLONASE) 50 MCG/ACT nasal spray, Place 2 sprays into both nostrils 2 (two) times daily., Disp: , Rfl:  ?  furosemide (LASIX) 20 MG tablet, Take 20 mg by mouth daily., Disp: , Rfl:  ?  glipiZIDE (GLUCOTROL XL) 5 MG 24 hr tablet, Take 5 mg by mouth daily with breakfast., Disp: , Rfl:  ?  hydrALAZINE (APRESOLINE) 50 MG tablet, Take 3 tablets (150 mg) by mouth TWICE daily (or every 12 hours), Disp: 180 tablet, Rfl: 6 ?  hydrOXYzine (ATARAX/VISTARIL) 10 MG tablet, Take 10 mg by mouth every 8 (eight) hours as needed for itching., Disp: , Rfl:  ?  Lansoprazole (PREVACID PO), Take by mouth., Disp: , Rfl:  ?  losartan (COZAAR) 100 MG tablet, Take 100 mg by mouth at bedtime., Disp: , Rfl:  ?  Melatonin 10 MG CAPS, Take 20 mg by mouth at bedtime., Disp: , Rfl:  ?  Multiple Vitamins-Minerals (WOMENS MULTIVITAMIN PO), Take 1 tablet by mouth daily., Disp: , Rfl:  ?  ondansetron (ZOFRAN-ODT) 4 MG disintegrating tablet, Take 1 tablet (4 mg total) by mouth every 8 (eight) hours as needed for nausea or vomiting., Disp: 20 tablet, Rfl: 0 ?  PERFOROMIST 20 MCG/2ML nebulizer solution, USE 1 VIAL VIA NEBULIZER AND INHALE BY MOUTH 2 TIMES A DAY, Disp: 120 mL, Rfl: 11 ?  Probiotic Product (PROBIOTIC BLEND PO), Take 1 capsule by mouth daily.,  Disp: , Rfl:  ?  revefenacin (YUPELRI) 175 MCG/3ML nebulizer solution, Take 3 mLs (175 mcg total) by nebulization daily., Disp: 90 mL, Rfl: 0 ?  rosuvastatin (CRESTOR) 20 MG tablet, Take 1 tablet (20 mg total) by mouth at bedtime., Disp: 30 tablet, Rfl: 5 ?  traMADol (ULTRAM) 50 MG tablet, Take 50 mg by mouth 3 (three) times daily as needed for moderate pain., Disp: , Rfl:  ?  traZODone (DESYREL) 150 MG tablet, Take 150 mg by mouth at bedtime., Disp: , Rfl:  ?No current facility-administered medications for this visit. ? ?Facility-Administered Medications Ordered in Other Visits:  ?  albuterol (PROVENTIL) (2.5 MG/3ML) 0.083% nebulizer solution 2.5 mg, 2.5 mg, Nebulization, Once, Wilhelmina Mcardle,  MD ? ?Past Medical History: ?Past Medical History:  ?Diagnosis Date  ? Arthritis   ? osteoarthritis  ? Carpal tunnel syndrome of left wrist   ? Depression   ? Diabetes mellitus without complication ([place])   ? Diverticulosis   ? GERD (gastroesophageal reflux disease)   ? Hypertension   ? PONV (postoperative nausea and vomiting)   ? PONV (postoperative nausea and vomiting)   ? Pulmonary fibrosis ([place])   ? Recurrent UTI   ? SUI (stress urinary incontinence, female)   ? Wears dentures   ? full upper and lower  ? ? ?Tobacco Use: ?Social History  ? ?Tobacco Use  ?Smoking Status Never  ?Smokeless Tobacco Never  ? ? ?Labs: ?Recent Review Flowsheet Data   ?There is no flowsheet data to display. ?  ? ? ? ?Exercise Target Goals: ?Exercise Program Goal: ?Individual exercise prescription set using results from initial 6 min walk test and THRR while considering  patient?s activity barriers and safety.  ? ?Exercise Prescription Goal: ?Initial exercise prescription builds to 30-45 minutes a day of aerobic activity, 2-3 days per week.  Home exercise guidelines will be given to patient during program as part of exercise prescription that the participant will acknowledge. ? ? ?Education: Aerobic Exercise: ?- Group verbal and visual presentation on the components of exercise prescription. Introduces F.I.T.T principle from ACSM for exercise prescriptions.  Reviews F.I.T.T. principles of aerobic exercise including progression. Written material given at graduation. ?Flowsheet Row Cardiac Rehab from 05/22/2021 in [facility] Cardiac and Pulmonary Rehab  ?Education need identified 05/22/21  ? ?  ? ? ?Education: Resistance Exercise: ?- Group verbal and visual presentation on the components of exercise prescription. Introduces F.I.T.T principle from ACSM for exercise prescriptions  Reviews F.I.T.T. principles of resistance exercise including progression. Written material given at graduation. ? ?  ?Education: Exercise & Equipment Safety: ?- Individual  verbal instruction and demonstration of equipment use and safety with use of the equipment. ?Flowsheet Row Cardiac Rehab from 05/22/2021 in [facility] Cardiac and Pulmonary Rehab  ?Education need identified 05/22/21  ?Date 05/22/21  ?Educator KL  ?Instruction Review Code 1- Verbalizes Understanding  ? ?  ? ? ?Education: Exercise Physiology & General Exercise Guidelines: ?- Group verbal and written instruction with models to review the exercise physiology of the cardiovascular system and associated critical values. Provides general exercise guidelines with specific guidelines to those with heart or lung disease.  ?Flowsheet Row Cardiac Rehab from 05/22/2021 in [facility] Cardiac and Pulmonary Rehab  ?Education need identified 05/22/21  ? ?  ? ? ?Education: Flexibility, Balance, Mind/Body Relaxation: ?- Group verbal and visual presentation with interactive activity on the components of exercise prescription. Introduces F.I.T.T principle from ACSM for exercise prescriptions. Reviews F.I.T.T. principles of flexibility and balance exercise training including  progression. Also discusses the mind body connection.  Reviews various relaxation techniques to help reduce and manage stress (i.e. Deep breathing, progressive muscle relaxation, and visualization). Balance handout provided to take home. Written material given at graduation. ? ? ?Activity Barriers & Risk Stratification: ? Activity Barriers & Cardiac Risk Stratification - 05/22/21 0939   ? ?  ? Activity Barriers & Cardiac Risk Stratification  ? Activity Barriers Balance Concerns;Arthritis;Joint Problems;Back Problems;Deconditioning;Muscular [employer] Device;Shortness of Breath   ? Cardiac Risk Stratification Moderate   ? ?  ?  ? ?  ? ? ?6 Minute Walk: ? 6 Minute Walk   ? ? [place] Name 05/22/21 0940  ?  ?  ?  ? 6 Minute Walk  ? Phase Initial    ? Distance 435 feet    ? Walk Time 5.25 minutes  Break 3:45-4:25    ? # of Rest Breaks 1    ? MPH 0.94    ? METS 0.46    ? RPE 13    ?  Perceived Dyspnea  2    ? VO2 Peak 1.63    ? Symptoms Yes (comment)    ? Comments SOB    ? Resting HR 62 bpm    ? Resting BP 154/74    ? Resting Oxygen Saturation  96 %    ? Exercise Oxygen Saturation  during 6

## 2021-05-27 ENCOUNTER — Other Ambulatory Visit: Payer: Self-pay

## 2021-05-27 DIAGNOSIS — E119 Type 2 diabetes mellitus without complications: Secondary | ICD-10-CM | POA: Diagnosis not present

## 2021-05-27 DIAGNOSIS — Z955 Presence of coronary angioplasty implant and graft: Secondary | ICD-10-CM

## 2021-05-27 DIAGNOSIS — Z48812 Encounter for surgical aftercare following surgery on the circulatory system: Secondary | ICD-10-CM | POA: Diagnosis not present

## 2021-05-27 LAB — GLUCOSE, CAPILLARY
Glucose-Capillary: 169 mg/dL — ABNORMAL HIGH (ref 70–99)
Glucose-Capillary: 163 mg/dL — ABNORMAL HIGH (ref 70–99)

## 2021-05-27 NOTE — Progress Notes (Signed)
Daily Session Note ? ?Patient Details  ?Name: Tiffany Alexander ?MRN: [id number] ?Date of Birth: 1946/01/27 ?Referring Provider:   ?Flowsheet Row Cardiac Rehab from 05/22/2021 in [facility] Cardiac and Pulmonary Rehab  ?Referring Provider Kathlyn Sacramento MD  ? ?  ? ? ?Encounter Date: 05/27/2021 ? ?Check In: ? Session Check In - 05/27/21 0755   ? ?  ? Check-In  ? Supervising physician immediately available to respond to emergencies See telemetry face sheet for immediately available ER MD   ? Location [facility]-Cardiac & Pulmonary Rehab   ? Staff Present Birdie Sons, MPA, RN;Jessica [place], MA, RCEP, CCRP, CCET;Amanda Sommer, BA, ACSM CEP, Exercise Physiologist   ? Virtual Visit No   ? Medication changes reported     No   ? Fall or balance concerns reported    No   ? Warm-up and Cool-down Performed on first and last piece of equipment   ? Resistance Training Performed Yes   ? VAD Patient? No   ? PAD/SET Patient? No   ?  ? Pain Assessment  ? Currently in Pain? No/denies   ? ?  ?  ? ?  ? ? ? ? ? ?Social History  ? ?Tobacco Use  ?Smoking Status Never  ?Smokeless Tobacco Never  ? ? ?Goals Met:  ?Independence with exercise equipment ?Exercise tolerated well ?No report of concerns or symptoms today ?Strength training completed today ? ?Goals Unmet:  ?Not Applicable ? ?Comments: First full day of exercise!  Patient was oriented to gym and equipment including functions, settings, policies, and procedures.  Patient's individual exercise prescription and treatment plan were reviewed.  All starting workloads were established based on the results of the 6 minute walk test done at initial orientation visit.  The plan for exercise progression was also introduced and progression will be customized based on patient's performance and goals. ? ? ? ?Dr. Emily Filbert is Medical Director for [place].  ?Dr. Ottie Glazier is Medical Director for [facility] Pulmonary Rehabilitation. ?

## 2021-05-28 ENCOUNTER — Encounter: Payer: Self-pay | Admitting: *Deleted

## 2021-05-28 DIAGNOSIS — Z955 Presence of coronary angioplasty implant and graft: Secondary | ICD-10-CM

## 2021-05-28 NOTE — Progress Notes (Signed)
Cardiac Individual Treatment Plan ? ?Patient Details  ?Name: Tiffany Alexander ?MRN: [id number] ?Date of Birth: [birth year] ?Referring Provider:   ?Flowsheet Row Cardiac Rehab from 05/22/2021 in [facility] Cardiac and Pulmonary Rehab  ?Referring Provider Kathlyn Sacramento MD  ? ?  ? ? ?Initial Encounter Date:  ?Flowsheet Row Cardiac Rehab from 05/22/2021 in [facility] Cardiac and Pulmonary Rehab  ?Date 05/22/21  ? ?  ? ? ?Visit Diagnosis: Status post coronary artery stent placement ? ?Patient's Home Medications on Admission: ? ?Current Outpatient Medications:  ?  albuterol (VENTOLIN HFA) 108 (90 Base) MCG/ACT inhaler, 2 puffs Q4H PRN, Disp: 18 g, Rfl: 0 ?  amLODipine (NORVASC) 10 MG tablet, Take 10 mg by mouth at bedtime., Disp: , Rfl:  ?  ascorbic acid (VITAMIN C) 500 MG tablet, Take 500 mg by mouth daily., Disp: , Rfl:  ?  aspirin 81 MG chewable tablet, Chew 1 tablet (81 mg total) by mouth daily., Disp: 30 tablet, Rfl: 11 ?  budesonide (PULMICORT) 0.25 MG/2ML nebulizer solution, Take 2 mLs (0.25 mg total) by nebulization 2 (two) times daily., Disp: 120 mL, Rfl: 6 ?  calcium citrate-vitamin D (CITRACAL+D) 315-200 MG-UNIT tablet, Take 1 tablet by mouth 2 (two) times daily., Disp: , Rfl:  ?  carvedilol (COREG) 25 MG tablet, Take 1 tablet (25 mg total) by mouth 2 (two) times daily., Disp: 180 tablet, Rfl: 3 ?  Cholecalciferol (VITAMIN D3) 1000 units CAPS, Take by mouth daily., Disp: , Rfl:  ?  citalopram (CELEXA) 20 MG tablet, Take by mouth., Disp: , Rfl:  ?  clopidogrel (PLAVIX) 75 MG tablet, Take 1 tablet (75 mg total) by mouth daily with breakfast., Disp: 90 tablet, Rfl: 3 ?  Cyanocobalamin (VITAMIN B 12 PO), Take 1 tablet by mouth daily., Disp: , Rfl:  ?  diphenhydrAMINE (BENADRYL) 25 MG tablet, Take 75 mg by mouth daily as needed for allergies., Disp: , Rfl:  ?  diphenoxylate-atropine (LOMOTIL) 2.5-0.025 MG tablet, Take 1 tablet by mouth 2 (two) times daily as needed for diarrhea or loose stools., Disp: , Rfl:  ?  fluticasone  (FLONASE) 50 MCG/ACT nasal spray, Place 2 sprays into both nostrils 2 (two) times daily., Disp: , Rfl:  ?  furosemide (LASIX) 20 MG tablet, Take 20 mg by mouth daily., Disp: , Rfl:  ?  glipiZIDE (GLUCOTROL XL) 5 MG 24 hr tablet, Take 5 mg by mouth daily with breakfast., Disp: , Rfl:  ?  hydrALAZINE (APRESOLINE) 50 MG tablet, Take 3 tablets (150 mg) by mouth TWICE daily (or every 12 hours), Disp: 180 tablet, Rfl: 6 ?  hydrOXYzine (ATARAX/VISTARIL) 10 MG tablet, Take 10 mg by mouth every 8 (eight) hours as needed for itching., Disp: , Rfl:  ?  Lansoprazole (PREVACID PO), Take by mouth., Disp: , Rfl:  ?  losartan (COZAAR) 100 MG tablet, Take 100 mg by mouth at bedtime., Disp: , Rfl:  ?  Melatonin 10 MG CAPS, Take 20 mg by mouth at bedtime., Disp: , Rfl:  ?  Multiple Vitamins-Minerals (WOMENS MULTIVITAMIN PO), Take 1 tablet by mouth daily., Disp: , Rfl:  ?  ondansetron (ZOFRAN-ODT) 4 MG disintegrating tablet, Take 1 tablet (4 mg total) by mouth every 8 (eight) hours as needed for nausea or vomiting., Disp: 20 tablet, Rfl: 0 ?  PERFOROMIST 20 MCG/2ML nebulizer solution, USE 1 VIAL VIA NEBULIZER AND INHALE BY MOUTH 2 TIMES A DAY, Disp: 120 mL, Rfl: 11 ?  Probiotic Product (PROBIOTIC BLEND PO), Take 1 capsule by mouth daily.,  Disp: , Rfl:  ?  revefenacin (YUPELRI) 175 MCG/3ML nebulizer solution, Take 3 mLs (175 mcg total) by nebulization daily., Disp: 90 mL, Rfl: 0 ?  rosuvastatin (CRESTOR) 20 MG tablet, Take 1 tablet (20 mg total) by mouth at bedtime., Disp: 30 tablet, Rfl: 5 ?  traMADol (ULTRAM) 50 MG tablet, Take 50 mg by mouth 3 (three) times daily as needed for moderate pain., Disp: , Rfl:  ?  traZODone (DESYREL) 150 MG tablet, Take 150 mg by mouth at bedtime., Disp: , Rfl:  ?No current facility-administered medications for this visit. ? ?Facility-Administered Medications Ordered in Other Visits:  ?  albuterol (PROVENTIL) (2.5 MG/3ML) 0.083% nebulizer solution 2.5 mg, 2.5 mg, Nebulization, Once, Wilhelmina Mcardle,  MD ? ?Past Medical History: ?Past Medical History:  ?Diagnosis Date  ? Arthritis   ? osteoarthritis  ? Carpal tunnel syndrome of left wrist   ? Depression   ? Diabetes mellitus without complication ([place])   ? Diverticulosis   ? GERD (gastroesophageal reflux disease)   ? Hypertension   ? PONV (postoperative nausea and vomiting)   ? PONV (postoperative nausea and vomiting)   ? Pulmonary fibrosis ([place])   ? Recurrent UTI   ? SUI (stress urinary incontinence, female)   ? Wears dentures   ? full upper and lower  ? ? ?Tobacco Use: ?Social History  ? ?Tobacco Use  ?Smoking Status Never  ?Smokeless Tobacco Never  ? ? ?Labs: ?Review Flowsheet   ? ?    ? View : No data to display.  ?  ?  ?  ?  ?  ? ? ? ?Exercise Target Goals: ?Exercise Program Goal: ?Individual exercise prescription set using results from initial 6 min walk test and THRR while considering  patient?s activity barriers and safety.  ? ?Exercise Prescription Goal: ?Initial exercise prescription builds to 30-45 minutes a day of aerobic activity, 2-3 days per week.  Home exercise guidelines will be given to patient during program as part of exercise prescription that the participant will acknowledge. ? ? ?Education: Aerobic Exercise: ?- Group verbal and visual presentation on the components of exercise prescription. Introduces F.I.T.T principle from ACSM for exercise prescriptions.  Reviews F.I.T.T. principles of aerobic exercise including progression. Written material given at graduation. ?Flowsheet Row Cardiac Rehab from 05/22/2021 in [facility] Cardiac and Pulmonary Rehab  ?Education need identified 05/22/21  ? ?  ? ? ?Education: Resistance Exercise: ?- Group verbal and visual presentation on the components of exercise prescription. Introduces F.I.T.T principle from ACSM for exercise prescriptions  Reviews F.I.T.T. principles of resistance exercise including progression. Written material given at graduation. ? ?  ?Education: Exercise & Equipment Safety: ?- Individual  verbal instruction and demonstration of equipment use and safety with use of the equipment. ?Flowsheet Row Cardiac Rehab from 05/22/2021 in [facility] Cardiac and Pulmonary Rehab  ?Education need identified 05/22/21  ?Date 05/22/21  ?Educator KL  ?Instruction Review Code 1- Verbalizes Understanding  ? ?  ? ? ?Education: Exercise Physiology & General Exercise Guidelines: ?- Group verbal and written instruction with models to review the exercise physiology of the cardiovascular system and associated critical values. Provides general exercise guidelines with specific guidelines to those with heart or lung disease.  ?Flowsheet Row Cardiac Rehab from 05/22/2021 in [facility] Cardiac and Pulmonary Rehab  ?Education need identified 05/22/21  ? ?  ? ? ?Education: Flexibility, Balance, Mind/Body Relaxation: ?- Group verbal and visual presentation with interactive activity on the components of exercise prescription. Introduces F.I.T.T principle from ACSM for  exercise prescriptions. Reviews F.I.T.T. principles of flexibility and balance exercise training including progression. Also discusses the mind body connection.  Reviews various relaxation techniques to help reduce and manage stress (i.e. Deep breathing, progressive muscle relaxation, and visualization). Balance handout provided to take home. Written material given at graduation. ? ? ?Activity Barriers & Risk Stratification: ? Activity Barriers & Cardiac Risk Stratification - 05/22/21 0939   ? ?  ? Activity Barriers & Cardiac Risk Stratification  ? Activity Barriers Balance Concerns;Arthritis;Joint Problems;Back Problems;Deconditioning;Muscular [employer] Device;Shortness of Breath   ? Cardiac Risk Stratification Moderate   ? ?  ?  ? ?  ? ? ?6 Minute Walk: ? 6 Minute Walk   ? ? [place] Name 05/22/21 0940  ?  ?  ?  ? 6 Minute Walk  ? Phase Initial    ? Distance 435 feet    ? Walk Time 5.25 minutes  Break 3:45-4:25    ? # of Rest Breaks 1    ? MPH 0.94    ? METS 0.46    ? RPE 13    ?  Perceived Dyspnea  2    ? VO2 Peak 1.63    ? Symptoms Yes (comment)    ? Comments SOB    ? Resting HR 62 bpm    ? Resting BP 154/74    ? Resting Oxygen Saturation  96 %    ? Exercise Oxygen Saturation  during 6 mi

## 2021-05-29 DIAGNOSIS — M5416 Radiculopathy, lumbar region: Secondary | ICD-10-CM | POA: Diagnosis not present

## 2021-05-29 DIAGNOSIS — M48062 Spinal stenosis, lumbar region with neurogenic claudication: Secondary | ICD-10-CM | POA: Diagnosis not present

## 2021-06-02 ENCOUNTER — Telehealth: Payer: Self-pay | Admitting: Cardiovascular Disease

## 2021-06-02 NOTE — Telephone Encounter (Signed)
This is a Dr. Rockey Situ pt ?

## 2021-06-02 NOTE — Telephone Encounter (Signed)
Pt c/o medication issue: ? ?1. Name of Medication: Carvedilol ? ?2. How are you currently taking this medication (dosage and times per day)? 3 in morning 3 at night ? ?3. Are you having a reaction (difficulty breathing--STAT)? no ? ?4. What is your medication issue? diarrhea ?

## 2021-06-05 DIAGNOSIS — Z955 Presence of coronary angioplasty implant and graft: Secondary | ICD-10-CM

## 2021-06-05 DIAGNOSIS — Z48812 Encounter for surgical aftercare following surgery on the circulatory system: Secondary | ICD-10-CM | POA: Diagnosis not present

## 2021-06-05 DIAGNOSIS — E119 Type 2 diabetes mellitus without complications: Secondary | ICD-10-CM | POA: Diagnosis not present

## 2021-06-05 LAB — GLUCOSE, CAPILLARY
Glucose-Capillary: 166 mg/dL — ABNORMAL HIGH (ref 70–99)
Glucose-Capillary: 105 mg/dL — ABNORMAL HIGH (ref 70–99)

## 2021-06-05 NOTE — Progress Notes (Signed)
Daily Session Note ? ?Patient Details  ?Name: Tiffany Alexander ?MRN: [id number] ?Date of Birth: 27-Dec-1945 ?Referring Provider:   ?Flowsheet Row Cardiac Rehab from 05/22/2021 in [facility] Cardiac and Pulmonary Rehab  ?Referring Provider Kathlyn Sacramento MD  ? ?  ? ? ?Encounter Date: 06/05/2021 ? ?Check In: ? Session Check In - 06/05/21 0746   ? ?  ? Check-In  ? Supervising physician immediately available to respond to emergencies See telemetry face sheet for immediately available ER MD   ? Location [facility]-Cardiac & Pulmonary Rehab   ? Staff Present Birdie Sons, MPA, RN;Joseph Tessie Fass, Lindell Spar, BA, ACSM CEP, Exercise Physiologist;Melissa [place], RDN, LDN   ? Virtual Visit No   ? Medication changes reported     No   ? Fall or balance concerns reported    No   ? Warm-up and Cool-down Performed on first and last piece of equipment   ? Resistance Training Performed Yes   ? VAD Patient? No   ? PAD/SET Patient? No   ?  ? Pain Assessment  ? Currently in Pain? No/denies   ? ?  ?  ? ?  ? ? ? ? ? ?Social History  ? ?Tobacco Use  ?Smoking Status Never  ?Smokeless Tobacco Never  ? ? ?Goals Met:  ?Independence with exercise equipment ?Exercise tolerated well ?No report of concerns or symptoms today ?Strength training completed today ? ?Goals Unmet:  ?Not Applicable ? ?Comments: Pt able to follow exercise prescription today without complaint.  Will continue to monitor for progression. ? ? ? ?Dr. Emily Filbert is Medical Director for [place].  ?Dr. Ottie Glazier is Medical Director for [facility] Pulmonary Rehabilitation. ?

## 2021-06-10 ENCOUNTER — Encounter: Payer: Medicare HMO | Attending: Cardiovascular Disease

## 2021-06-10 DIAGNOSIS — E118 Type 2 diabetes mellitus with unspecified complications: Secondary | ICD-10-CM | POA: Diagnosis not present

## 2021-06-10 DIAGNOSIS — Z955 Presence of coronary angioplasty implant and graft: Secondary | ICD-10-CM | POA: Insufficient documentation

## 2021-06-10 NOTE — Progress Notes (Signed)
Daily Session Note ? ?Patient Details  ?Name: Tiffany Alexander ?MRN: [id number] ?Date of Birth: Jul 22, 1945 ?Referring Provider:   ?Flowsheet Row Cardiac Rehab from 05/22/2021 in [facility] Cardiac and Pulmonary Rehab  ?Referring Provider Kathlyn Sacramento MD  ? ?  ? ? ?Encounter Date: 06/10/2021 ? ?Check In: ? Session Check In - 06/10/21 0806   ? ?  ? Check-In  ? Supervising physician immediately available to respond to emergencies See telemetry face sheet for immediately available ER MD   ? Location [facility]-Cardiac & Pulmonary Rehab   ? Staff Present Birdie Sons, MPA, RN;Amanda Sommer, BA, ACSM CEP, Exercise Physiologist;Jessica Luan Pulling, MA, RCEP, CCRP, CCET   ? Virtual Visit No   ? Medication changes reported     No   ? Fall or balance concerns reported    No   ? Warm-up and Cool-down Performed on first and last piece of equipment   ? Resistance Training Performed Yes   ? VAD Patient? No   ? PAD/SET Patient? No   ?  ? Pain Assessment  ? Currently in Pain? No/denies   ? ?  ?  ? ?  ? ? ? ? ? ?Social History  ? ?Tobacco Use  ?Smoking Status Never  ?Smokeless Tobacco Never  ? ? ?Goals Met:  ?Independence with exercise equipment ?Exercise tolerated well ?No report of concerns or symptoms today ?Strength training completed today ? ?Goals Unmet:  ?Not Applicable ? ?Comments: Pt able to follow exercise prescription today without complaint.  Will continue to monitor for progression. ? ? ? ?Dr. Emily Filbert is Medical Director for [place].  ?Dr. Ottie Glazier is Medical Director for [facility] Pulmonary Rehabilitation. ?

## 2021-06-11 ENCOUNTER — Other Ambulatory Visit: Payer: Self-pay | Admitting: Physician Assistant

## 2021-06-11 LAB — GLUCOSE, CAPILLARY: Glucose-Capillary: 162 mg/dL — ABNORMAL HIGH (ref 70–99)

## 2021-06-12 DIAGNOSIS — Z955 Presence of coronary angioplasty implant and graft: Secondary | ICD-10-CM

## 2021-06-12 DIAGNOSIS — E118 Type 2 diabetes mellitus with unspecified complications: Secondary | ICD-10-CM | POA: Diagnosis not present

## 2021-06-12 NOTE — Progress Notes (Signed)
Daily Session Note ? ?Patient Details  ?Name: Tiffany Alexander ?MRN: [id number] ?Date of Birth: 09-14-1945 ?Referring Provider:   ?Flowsheet Row Cardiac Rehab from 05/22/2021 in [facility] Cardiac and Pulmonary Rehab  ?Referring Provider Kathlyn Sacramento MD  ? ?  ? ? ?Encounter Date: 06/12/2021 ? ?Check In: ? Session Check In - 06/12/21 0801   ? ?  ? Check-In  ? Supervising physician immediately available to respond to emergencies See telemetry face sheet for immediately available ER MD   ? Location [facility]-Cardiac & Pulmonary Rehab   ? Staff Present Birdie Sons, MPA, RN;Reesa Gotschall Amedeo Plenty, BS, ACSM CEP, Exercise Physiologist;Jessica Luan Pulling, MA, RCEP, CCRP, CCET   ? Virtual Visit No   ? Medication changes reported     No   ? Fall or balance concerns reported    No   ? Warm-up and Cool-down Performed on first and last piece of equipment   ? Resistance Training Performed Yes   ? VAD Patient? No   ? PAD/SET Patient? No   ?  ? Pain Assessment  ? Currently in Pain? No/denies   ? ?  ?  ? ?  ? ? ? ? ? ?Social History  ? ?Tobacco Use  ?Smoking Status Never  ?Smokeless Tobacco Never  ? ? ?Goals Met:  ?Independence with exercise equipment ?Exercise tolerated well ?Personal goals reviewed ?No report of concerns or symptoms today ?Strength training completed today ? ?Goals Unmet:  ?Not Applicable ? ?Comments: Pt able to follow exercise prescription today without complaint.  Will continue to monitor for progression. ? ? ? ?Dr. Emily Filbert is Medical Director for [place].  ?Dr. Ottie Glazier is Medical Director for [facility] Pulmonary Rehabilitation. ?

## 2021-06-17 DIAGNOSIS — E118 Type 2 diabetes mellitus with unspecified complications: Secondary | ICD-10-CM | POA: Diagnosis not present

## 2021-06-17 DIAGNOSIS — Z955 Presence of coronary angioplasty implant and graft: Secondary | ICD-10-CM | POA: Diagnosis not present

## 2021-06-17 NOTE — Progress Notes (Signed)
Cardiology Office Note ? ?Date:  06/18/2021  ? ?ID:  Tiffany Alexander, DOB 1945-12-24, MRN [id number] ? ?PCP:  Maryland Pink, MD  ? ?Chief Complaint  ?Patient presents with  ? 1 month follow up   ?  "Doing well." Medications reviewed by the patient verbally.   ? ? ?HPI:  ?Tiffany Alexander is a 76 year old woman with past medical history of ?asthma ?Hypertension ?Diabetes ?Coronary calcification on CT scan October 2022 ?mild pulmonary fibrosis ?Movement disorder, ?Morbid obesity ?OSA on oxygen ?Who presents for f/u of her bradycardia, shortness of breath, coronary calcification ? ?LOV 04/2021 ?Daughter presents with her today ? ?On last clinic visit, reported having increasing shortness of breath ?Cardiac CTA: April 24, 2021 ?bilateral chronic appearing peripheral predominant interstitial reticulation which may reflect sequelae of chronic interstitial lung disease ?-- Calcium score 540 ?Severe ostial RCA disease ? ?Underwent cardiac catheterization May 05, 2021 ?Stent placed to ostial RCA ? ?Reports that she feels better, breathing is improving ?Has started working with cardiac rehab ?Family also confirms conditioning is improving ? ?Checking blood pressure at home but uncertain the range, left the numbers/papers at home ? ?EKG personally reviewed by myself on todays visit ?Normal sinus rhythm rate 63 bpm no significant ST-T wave changes ? ?Lab work reviewed ?A1C 6.8 ? ? ?PMH:   has a past medical history of Arthritis, Carpal tunnel syndrome of left wrist, Depression, Diabetes mellitus without complication ([place]), Diverticulosis, GERD (gastroesophageal reflux disease), Hypertension, PONV (postoperative nausea and vomiting), PONV (postoperative nausea and vomiting), Pulmonary fibrosis ([place]), Recurrent UTI, SUI (stress urinary incontinence, female), and Wears dentures. ? ?PSH:    ?Past Surgical History:  ?Procedure Laterality Date  ?  endocervical curettage    ? ABDOMINAL HYSTERECTOMY    ? BLADDER SUSPENSION    ?  CARPAL TUNNEL RELEASE Left   ? CARPAL TUNNEL RELEASE Right 05/14/2015  ? Procedure: CARPAL TUNNEL RELEASE;  Surgeon: Hessie Knows, MD;  Location: [facility] ORS;  Service: Orthopedics;  Laterality: Right;  ? CATARACT EXTRACTION W/PHACO Right 12/17/2015  ? Procedure: CATARACT EXTRACTION PHACO AND INTRAOCULAR LENS PLACEMENT (IOC);  Surgeon: Birder Robson, MD;  Location: [facility];  Service: Ophthalmology;  Laterality: Right;  [place] 52.0 ?AP% 15.4 ?CDE 7.99 ?Fluid pack lot # [id number]  ? CATARACT EXTRACTION W/PHACO Left 01/21/2016  ? Procedure: CATARACT EXTRACTION PHACO AND INTRAOCULAR LENS PLACEMENT (IOC);  Surgeon: Birder Robson, MD;  Location: [facility];  Service: Ophthalmology;  Laterality: Left;  [place] 50.4 ?AP% 19.6 ?CDE 9.92 ?FLUID PACK LOT # [id number]  ? CESAREAN SECTION N/A   ? COLONOSCOPY N/A 08/24/2014  ? Procedure: COLONOSCOPY;  Surgeon: Lollie Sails, MD;  Location: [facility] ENDOSCOPY;  Service: Endoscopy;  Laterality: N/A;  ? COLONOSCOPY N/A 03/13/2020  ? Procedure: COLONOSCOPY;  Surgeon: Toledo, Benay Pike, MD;  Location: [facility] ENDOSCOPY;  Service: Gastroenterology;  Laterality: N/A;  ? COLONOSCOPY WITH PROPOFOL N/A 04/05/2015  ? Procedure: COLONOSCOPY WITH PROPOFOL;  Surgeon: Lollie Sails, MD;  Location: [facility] ENDOSCOPY;  Service: Endoscopy;  Laterality: N/A;  ? COLPORRHAPHY    ? CORONARY STENT INTERVENTION N/A 05/05/2021  ? Procedure: CORONARY STENT INTERVENTION;  Surgeon: Wellington Hampshire, MD;  Location: [place] CV LAB;  Service: Cardiovascular;  Laterality: N/A;  ? EYE SURGERY    ? FINGER ARTHRODESIS Right 07/12/2014  ? Procedure: Right middle finger DIP fussion;  Surgeon: Hessie Knows, MD;  Location: [facility] OR[facility];  Service: Orthopedics;  Laterality: Right;  ? FRACTURE SURGERY    ? fractured wrist Right   ?  HAMMER TOE SURGERY Left 04/01/2016  ? Procedure: HAMMER TOE CORRECTION  Left 2nd  & 3rd toe;  Surgeon: [name], DPM;  Location: [place];  Service: Podiatry;  Laterality: Left;  Special:  Hammer  lock implants ?Left 2nd & 3rd ?IVA LOcal ?Diabetic - oral meds  ? LEFT HEART CATH AND CORONARY ANGIOGRAPHY Left 05/05/2021  ? Procedure: LEFT HEART CATH AND CORONARY ANGIOGRAPHY;  Surgeon: Wellington Hampshire, MD;  Location: [place] CV LAB;  Service: Cardiovascular;  Laterality: Left;  ? OPEN REDUCTION INTERNAL FIXATION (ORIF) METACARPAL Left 05/14/2015  ? Procedure: OPEN REDUCTION INTERNAL FIXATION (ORIF) METACARPAL;  Surgeon: Hessie Knows, MD;  Location: [facility];  Service: Orthopedics;  Laterality: Left;  ? SEPTOPLASTY    ? TUBAL LIGATION    ? ? ?Current Outpatient Medications  ?Medication Sig Dispense Refill  ? albuterol (VENTOLIN HFA) 108 (90 Base) MCG/ACT inhaler 2 puffs Q4H PRN 18 g 0  ? amLODipine (NORVASC) 10 MG tablet Take 10 mg by mouth at bedtime.    ? ascorbic acid (VITAMIN C) 500 MG tablet Take 500 mg by mouth daily.    ? aspirin 81 MG chewable tablet Chew 1 tablet (81 mg total) by mouth daily. 30 tablet 11  ? budesonide (PULMICORT) 0.25 MG/2ML nebulizer solution Take 2 mLs (0.25 mg total) by nebulization 2 (two) times daily. 120 mL 6  ? calcium citrate-vitamin D (CITRACAL+D) 315-200 MG-UNIT tablet Take 1 tablet by mouth 2 (two) times daily.    ? carvedilol (COREG) 25 MG tablet Take 1 tablet (25 mg total) by mouth 2 (two) times daily. 180 tablet 3  ? Cholecalciferol (VITAMIN D3) 1000 units CAPS Take by mouth daily.    ? citalopram (CELEXA) 20 MG tablet Take by mouth.    ? clopidogrel (PLAVIX) 75 MG tablet Take 1 tablet (75 mg total) by mouth daily with breakfast. 90 tablet 3  ? Cyanocobalamin (VITAMIN B 12 PO) Take 1 tablet by mouth daily.    ? diphenhydrAMINE (BENADRYL) 25 MG tablet Take 75 mg by mouth daily as needed for allergies.    ? diphenoxylate-atropine (LOMOTIL) 2.5-0.025 MG tablet Take 1 tablet by mouth 2 (two) times daily as needed for diarrhea or loose stools.    ? fluticasone (FLONASE) 50 MCG/ACT nasal spray Place 2 sprays into both nostrils 2 (two) times daily.    ? furosemide (LASIX) 20  MG tablet Take 20 mg by mouth daily.    ? glipiZIDE (GLUCOTROL XL) 5 MG 24 hr tablet Take 5 mg by mouth daily with breakfast.    ? hydrALAZINE (APRESOLINE) 50 MG tablet Take 3 tablets (150 mg) by mouth TWICE daily (or every 12 hours) 180 tablet 6  ? hydrOXYzine (ATARAX/VISTARIL) 10 MG tablet Take 10 mg by mouth every 8 (eight) hours as needed for itching.    ? Lansoprazole (PREVACID PO) Take by mouth.    ? losartan (COZAAR) 100 MG tablet Take 100 mg by mouth at bedtime.    ? Melatonin 10 MG CAPS Take 20 mg by mouth at bedtime.    ? Multiple Vitamins-Minerals (WOMENS MULTIVITAMIN PO) Take 1 tablet by mouth daily.    ? ondansetron (ZOFRAN-ODT) 4 MG disintegrating tablet Take 1 tablet (4 mg total) by mouth every 8 (eight) hours as needed for nausea or vomiting. 20 tablet 0  ? PERFOROMIST 20 MCG/2ML nebulizer solution USE 1 VIAL VIA NEBULIZER AND INHALE BY MOUTH 2 TIMES A DAY 120 mL 11  ? Probiotic Product (PROBIOTIC BLEND PO)  Take 1 capsule by mouth daily.    ? revefenacin (YUPELRI) 175 MCG/3ML nebulizer solution Take 3 mLs (175 mcg total) by nebulization daily. 90 mL 0  ? rosuvastatin (CRESTOR) 20 MG tablet Take 1 tablet (20 mg total) by mouth at bedtime. 30 tablet 5  ? traMADol (ULTRAM) 50 MG tablet Take 50 mg by mouth 3 (three) times daily as needed for moderate pain.    ? traZODone (DESYREL) 150 MG tablet Take 150 mg by mouth at bedtime.    ? ?No current facility-administered medications for this visit.  ? ?Facility-Administered Medications Ordered in Other Visits  ?Medication Dose Route Frequency Provider Last Rate Last Admin  ? albuterol (PROVENTIL) (2.5 MG/3ML) 0.083% nebulizer solution 2.5 mg  2.5 mg Nebulization Once Wilhelmina Mcardle, MD      ? ? ?Allergies:   Codeine, Erythromycin, Nitrofurantoin, and Penicillins  ? ?Social History:  The patient  reports that she has never smoked. She has never used smokeless tobacco. She reports that she does not drink alcohol and does not use drugs.  ? ?Family History:    family history includes Breast cancer in her maternal aunt; Breast cancer (age of onset: 72) in her paternal grandmother; Cancer in her father.  ? ? ?Review of Systems: ?Review of Systems  ?[employer]stit[employer]

## 2021-06-17 NOTE — Progress Notes (Signed)
Daily Session Note ? ?Patient Details  ?Name: Tiffany Alexander ?MRN: [id number] ?Date of Birth: [birth year] ?Referring Provider:   ?Flowsheet Row Cardiac Rehab from 05/22/2021 in [facility] Cardiac and Pulmonary Rehab  ?Referring Provider Kathlyn Sacramento MD  ? ?  ? ? ?Encounter Date: 06/17/2021 ? ?Check In: ? Session Check In - 06/17/21 0744   ? ?  ? Check-In  ? Supervising physician immediately available to respond to emergencies See telemetry face sheet for immediately available ER MD   ? Location [facility]-Cardiac & Pulmonary Rehab   ? Staff Present Birdie Sons, MPA, RN;Melissa Jackson, RDN, Rowe Pavy, BA, ACSM CEP, Exercise Physiologist   ? Virtual Visit No   ? Medication changes reported     No   ? Fall or balance concerns reported    No   ? Warm-up and Cool-down Performed on first and last piece of equipment   ? Resistance Training Performed Yes   ? VAD Patient? No   ? PAD/SET Patient? No   ?  ? Pain Assessment  ? Currently in Pain? No/denies   ? ?  ?  ? ?  ? ? ? ? ? ?Social History  ? ?Tobacco Use  ?Smoking Status Never  ?Smokeless Tobacco Never  ? ? ?Goals Met:  ?Independence with exercise equipment ?Exercise tolerated well ?No report of concerns or symptoms today ?Strength training completed today ? ?Goals Unmet:  ?Not Applicable ? ?Comments: Pt able to follow exercise prescription today without complaint.  Will continue to monitor for progression. ? ? ? ?Dr. Emily Filbert is Medical Director for [place].  ?Dr. Ottie Glazier is Medical Director for [facility] Pulmonary Rehabilitation. ?

## 2021-06-18 ENCOUNTER — Ambulatory Visit: Payer: Medicare HMO | Admitting: Cardiovascular Disease

## 2021-06-18 ENCOUNTER — Encounter: Payer: Self-pay | Admitting: Cardiovascular Disease

## 2021-06-18 VITALS — BP 140/68 | HR 63 | Ht 62.5 in | Wt 208.2 lb

## 2021-06-18 DIAGNOSIS — I10 Essential (primary) hypertension: Secondary | ICD-10-CM

## 2021-06-18 DIAGNOSIS — I2510 Atherosclerotic heart disease of native coronary artery without angina pectoris: Secondary | ICD-10-CM | POA: Diagnosis not present

## 2021-06-18 DIAGNOSIS — J8410 Pulmonary fibrosis, unspecified: Secondary | ICD-10-CM | POA: Diagnosis not present

## 2021-06-18 DIAGNOSIS — R072 Precordial pain: Secondary | ICD-10-CM

## 2021-06-18 DIAGNOSIS — E782 Mixed hyperlipidemia: Secondary | ICD-10-CM | POA: Diagnosis not present

## 2021-06-18 NOTE — Patient Instructions (Addendum)
Please monitor blood pressure ?Please call with numbers ? ?Medication Instructions:  ?No changes ? ?If you need a refill on your cardiac medications before your next appointment, please call your pharmacy.  ? ?Lab work: ?No new labs needed ? ?Testing/Procedures: ?No new testing needed ? ?Follow-Up: ?At [facility], you and your health needs are our priority.  As part of our continuing mission to provide you with exceptional heart care, we have created designated Provider Care Teams.  These Care Teams include your primary Cardiologist (physician) and Advanced Practice Providers (APPs -  Physician Assistants and Nurse Practitioners) who all work together to provide you with the care you need, when you need it. ? ?You will need a follow up appointment in 6 months ? ?Providers on your designated Care Team:   ?Murray Hodgkins, NP ?Christell Faith, PA-C ?Cadence Kathlen Mody, PA-C ? ?COVID-19 Vaccine Information can be found at: [email or web address] For questions related to vaccine distribution or appointments, please email [email or web address] or call [phone].  ? ?

## 2021-06-25 ENCOUNTER — Encounter: Payer: Self-pay | Admitting: *Deleted

## 2021-06-25 DIAGNOSIS — Z955 Presence of coronary angioplasty implant and graft: Secondary | ICD-10-CM

## 2021-06-25 NOTE — Progress Notes (Signed)
Cardiac Individual Treatment Plan ? ?Patient Details  ?Name: Tiffany Alexander ?MRN: [id number] ?Date of Birth: 09-May-1945 ?Referring Provider:   ?Flowsheet Row Cardiac Rehab from 05/22/2021 in [facility] Cardiac and Pulmonary Rehab  ?Referring Provider Kathlyn Sacramento MD  ? ?  ? ? ?Initial Encounter Date:  ?Flowsheet Row Cardiac Rehab from 05/22/2021 in [facility] Cardiac and Pulmonary Rehab  ?Date 05/22/21  ? ?  ? ? ?Visit Diagnosis: Status post coronary artery stent placement ? ?Patient's Home Medications on Admission: ? ?Current Outpatient Medications:  ?  albuterol (VENTOLIN HFA) 108 (90 Base) MCG/ACT inhaler, 2 puffs Q4H PRN, Disp: 18 g, Rfl: 0 ?  amLODipine (NORVASC) 10 MG tablet, Take 10 mg by mouth at bedtime., Disp: , Rfl:  ?  ascorbic acid (VITAMIN C) 500 MG tablet, Take 500 mg by mouth daily., Disp: , Rfl:  ?  aspirin 81 MG chewable tablet, Chew 1 tablet (81 mg total) by mouth daily., Disp: 30 tablet, Rfl: 11 ?  budesonide (PULMICORT) 0.25 MG/2ML nebulizer solution, Take 2 mLs (0.25 mg total) by nebulization 2 (two) times daily., Disp: 120 mL, Rfl: 6 ?  calcium citrate-vitamin D (CITRACAL+D) 315-200 MG-UNIT tablet, Take 1 tablet by mouth 2 (two) times daily., Disp: , Rfl:  ?  carvedilol (COREG) 25 MG tablet, Take 1 tablet (25 mg total) by mouth 2 (two) times daily., Disp: 180 tablet, Rfl: 3 ?  cephALEXin (KEFLEX) 250 MG capsule, Take 250 mg by mouth daily. As needed for UTI, Disp: , Rfl:  ?  Cholecalciferol (VITAMIN D3) 1000 units CAPS, Take by mouth daily., Disp: , Rfl:  ?  citalopram (CELEXA) 20 MG tablet, Take by mouth., Disp: , Rfl:  ?  clopidogrel (PLAVIX) 75 MG tablet, Take 1 tablet (75 mg total) by mouth daily with breakfast., Disp: 90 tablet, Rfl: 3 ?  Cyanocobalamin (VITAMIN B 12 PO), Take 1 tablet by mouth daily., Disp: , Rfl:  ?  diphenhydrAMINE (BENADRYL) 25 MG tablet, Take 75 mg by mouth daily as needed for allergies., Disp: , Rfl:  ?  diphenoxylate-atropine (LOMOTIL) 2.5-0.025 MG tablet, Take 1 tablet by  mouth 2 (two) times daily as needed for diarrhea or loose stools., Disp: , Rfl:  ?  fluticasone (FLONASE) 50 MCG/ACT nasal spray, Place 2 sprays into both nostrils 2 (two) times daily., Disp: , Rfl:  ?  furosemide (LASIX) 20 MG tablet, Take 20 mg by mouth daily., Disp: , Rfl:  ?  glipiZIDE (GLUCOTROL XL) 5 MG 24 hr tablet, Take 5 mg by mouth daily with breakfast., Disp: , Rfl:  ?  hydrALAZINE (APRESOLINE) 50 MG tablet, Take 3 tablets (150 mg) by mouth TWICE daily (or every 12 hours) (Patient taking differently: Take 2 tablets in the am & 1 tablet in the pm), Disp: 180 tablet, Rfl: 6 ?  hydrOXYzine (ATARAX/VISTARIL) 10 MG tablet, Take 10 mg by mouth every 8 (eight) hours as needed for itching., Disp: , Rfl:  ?  Lansoprazole (PREVACID PO), Take by mouth., Disp: , Rfl:  ?  losartan (COZAAR) 100 MG tablet, Take 100 mg by mouth at bedtime., Disp: , Rfl:  ?  Melatonin 10 MG CAPS, Take 20 mg by mouth at bedtime., Disp: , Rfl:  ?  Multiple Vitamins-Minerals (WOMENS MULTIVITAMIN PO), Take 1 tablet by mouth daily., Disp: , Rfl:  ?  ondansetron (ZOFRAN-ODT) 4 MG disintegrating tablet, Take 1 tablet (4 mg total) by mouth every 8 (eight) hours as needed for nausea or vomiting., Disp: 20 tablet, Rfl: 0 ?  PERFOROMIST 20 MCG/2ML nebulizer solution, USE 1 VIAL VIA NEBULIZER AND INHALE BY MOUTH 2 TIMES A DAY, Disp: 120 mL, Rfl: 11 ?  Probiotic Product (PROBIOTIC BLEND PO), Take 1 capsule by mouth daily., Disp: , Rfl:  ?  revefenacin (YUPELRI) 175 MCG/3ML nebulizer solution, Take 3 mLs (175 mcg total) by nebulization daily., Disp: 90 mL, Rfl: 0 ?  rosuvastatin (CRESTOR) 20 MG tablet, Take 1 tablet (20 mg total) by mouth at bedtime., Disp: 30 tablet, Rfl: 5 ?  traMADol (ULTRAM) 50 MG tablet, Take 50 mg by mouth 3 (three) times daily as needed for moderate pain., Disp: , Rfl:  ?  traZODone (DESYREL) 150 MG tablet, Take 150 mg by mouth at bedtime., Disp: , Rfl:  ?No current facility-administered medications for this  visit. ? ?Facility-Administered Medications Ordered in Other Visits:  ?  albuterol (PROVENTIL) (2.5 MG/3ML) 0.083% nebulizer solution 2.5 mg, 2.5 mg, Nebulization, Once, Wilhelmina Mcardle, MD ? ?Past Medical History: ?Past Medical History:  ?Diagnosis Date  ? Arthritis   ? osteoarthritis  ? Carpal tunnel syndrome of left wrist   ? Depression   ? Diabetes mellitus without complication ([place])   ? Diverticulosis   ? GERD (gastroesophageal reflux disease)   ? Hypertension   ? PONV (postoperative nausea and vomiting)   ? PONV (postoperative nausea and vomiting)   ? Pulmonary fibrosis ([place])   ? Recurrent UTI   ? SUI (stress urinary incontinence, female)   ? Wears dentures   ? full upper and lower  ? ? ?Tobacco Use: ?Social History  ? ?Tobacco Use  ?Smoking Status Never  ?Smokeless Tobacco Never  ? ? ?Labs: ?Review Flowsheet   ? ?    ? View : No data to display.  ?  ?  ?  ?  ?  ? ? ? ?Exercise Target Goals: ?Exercise Program Goal: ?Individual exercise prescription set using results from initial 6 min walk test and THRR while considering  patient?s activity barriers and safety.  ? ?Exercise Prescription Goal: ?Initial exercise prescription builds to 30-45 minutes a day of aerobic activity, 2-3 days per week.  Home exercise guidelines will be given to patient during program as part of exercise prescription that the participant will acknowledge. ? ? ?Education: Aerobic Exercise: ?- Group verbal and visual presentation on the components of exercise prescription. Introduces F.I.T.T principle from ACSM for exercise prescriptions.  Reviews F.I.T.T. principles of aerobic exercise including progression. Written material given at graduation. ?Flowsheet Row Cardiac Rehab from 05/22/2021 in [facility] Cardiac and Pulmonary Rehab  ?Education need identified 05/22/21  ? ?  ? ? ?Education: Resistance Exercise: ?- Group verbal and visual presentation on the components of exercise prescription. Introduces F.I.T.T principle from ACSM for exercise  prescriptions  Reviews F.I.T.T. principles of resistance exercise including progression. Written material given at graduation. ? ?  ?Education: Exercise & Equipment Safety: ?- Individual verbal instruction and demonstration of equipment use and safety with use of the equipment. ?Flowsheet Row Cardiac Rehab from 05/22/2021 in [facility] Cardiac and Pulmonary Rehab  ?Education need identified 05/22/21  ?Date 05/22/21  ?Educator KL  ?Instruction Review Code 1- Verbalizes Understanding  ? ?  ? ? ?Education: Exercise Physiology & General Exercise Guidelines: ?- Group verbal and written instruction with models to review the exercise physiology of the cardiovascular system and associated critical values. Provides general exercise guidelines with specific guidelines to those with heart or lung disease.  ?Flowsheet Row Cardiac Rehab from 05/22/2021 in [facility] Cardiac and Pulmonary Rehab  ?  Education need identified 05/22/21  ? ?  ? ? ?Education: Flexibility, Balance, Mind/Body Relaxation: ?- Group verbal and visual presentation with interactive activity on the components of exercise prescription. Introduces F.I.T.T principle from ACSM for exercise prescriptions. Reviews F.I.T.T. principles of flexibility and balance exercise training including progression. Also discusses the mind body connection.  Reviews various relaxation techniques to help reduce and manage stress (i.e. Deep breathing, progressive muscle relaxation, and visualization). Balance handout provided to take home. Written material given at graduation. ? ? ?Activity Barriers & Risk Stratification: ? Activity Barriers & Cardiac Risk Stratification - 05/22/21 0939   ? ?  ? Activity Barriers & Cardiac Risk Stratification  ? Activity Barriers Balance Concerns;Arthritis;Joint Problems;Back Problems;Deconditioning;Muscular [employer] Device;Shortness of Breath   ? Cardiac Risk Stratification Moderate   ? ?  ?  ? ?  ? ? ?6 Minute Walk: ? 6 Minute Walk   ? ? [place] Name  05/22/21 0940  ?  ?  ?  ? 6 Minute Walk  ? Phase Initial    ? Distance 435 feet    ? Walk Time 5.25 minutes  Break 3:45-4:25    ? # of Rest Breaks 1    ? MPH 0.94    ? METS 0.46    ? RPE 13    ? Perceived Dyspnea  2    ? VO2 Peak 1.

## 2021-07-01 DIAGNOSIS — E118 Type 2 diabetes mellitus with unspecified complications: Secondary | ICD-10-CM | POA: Diagnosis not present

## 2021-07-01 DIAGNOSIS — Z955 Presence of coronary angioplasty implant and graft: Secondary | ICD-10-CM | POA: Diagnosis not present

## 2021-07-01 NOTE — Progress Notes (Signed)
Daily Session Note ? ?Patient Details  ?Name: Tiffany Alexander ?MRN: [id number] ?Date of Birth: December 02, 1945 ?Referring Provider:   ?Flowsheet Row Cardiac Rehab from 05/22/2021 in [facility] Cardiac and Pulmonary Rehab  ?Referring Provider Kathlyn Sacramento MD  ? ?  ? ? ?Encounter Date: 07/01/2021 ? ?Check In: ? Session Check In - 07/01/21 0746   ? ?  ? Check-In  ? Supervising physician immediately available to respond to emergencies See telemetry face sheet for immediately available ER MD   ? Location [facility]-Cardiac & Pulmonary Rehab   ? Staff Present Birdie Sons, MPA, RN;Jessica Luan Pulling, MA, RCEP, CCRP, CCET;Joseph [place], [place]   ? Virtual Visit No   ? Medication changes reported     No   ? Fall or balance concerns reported    No   ? Warm-up and Cool-down Performed on first and last piece of equipment   ? Resistance Training Performed Yes   ? VAD Patient? No   ? PAD/SET Patient? No   ?  ? Pain Assessment  ? Currently in Pain? No/denies   ? ?  ?  ? ?  ? ? ? ? ? ?Social History  ? ?Tobacco Use  ?Smoking Status Never  ?Smokeless Tobacco Never  ? ? ?Goals Met:  ?Independence with exercise equipment ?Exercise tolerated well ?No report of concerns or symptoms today ?Strength training completed today ? ?Goals Unmet:  ?Not Applicable ? ?Comments: Pt able to follow exercise prescription today without complaint.  Will continue to monitor for progression. ? ? ? ?Dr. Emily Filbert is Medical Director for [place].  ?Dr. Ottie Glazier is Medical Director for [facility] Pulmonary Rehabilitation. ?

## 2021-07-03 DIAGNOSIS — Z955 Presence of coronary angioplasty implant and graft: Secondary | ICD-10-CM | POA: Diagnosis not present

## 2021-07-03 DIAGNOSIS — E118 Type 2 diabetes mellitus with unspecified complications: Secondary | ICD-10-CM | POA: Diagnosis not present

## 2021-07-03 NOTE — Progress Notes (Signed)
Daily Session Note ? ?Patient Details  ?Name: Tiffany Alexander ?MRN: [id number] ?Date of Birth: Nov 29, 1945 ?Referring Provider:   ?Flowsheet Row Cardiac Rehab from 05/22/2021 in [facility] Cardiac and Pulmonary Rehab  ?Referring Provider Kathlyn Sacramento MD  ? ?  ? ? ?Encounter Date: 07/03/2021 ? ?Check In: ? Session Check In - 07/03/21 0756   ? ?  ? Check-In  ? Supervising physician immediately available to respond to emergencies See telemetry face sheet for immediately available ER MD   ? Location [facility]-Cardiac & Pulmonary Rehab   ? Staff Present Birdie Sons, MPA, RN;Joseph [place], RCP,RRT,BSRT;Melissa [place], RDN, LDN   ? Virtual Visit No   ? Medication changes reported     No   ? Fall or balance concerns reported    No   ? Warm-up and Cool-down Performed on first and last piece of equipment   ? Resistance Training Performed Yes   ? VAD Patient? No   ? PAD/SET Patient? No   ?  ? Pain Assessment  ? Currently in Pain? No/denies   ? ?  ?  ? ?  ? ? ? ? ? ?Social History  ? ?Tobacco Use  ?Smoking Status Never  ?Smokeless Tobacco Never  ? ? ?Goals Met:  ?Independence with exercise equipment ?Exercise tolerated well ?No report of concerns or symptoms today ?Strength training completed today ? ?Goals Unmet:  ?Not Applicable ? ?Comments: Pt able to follow exercise prescription today without complaint.  Will continue to monitor for progression. ? ? ? ?Dr. Emily Filbert is Medical Director for [place].  ?Dr. Ottie Glazier is Medical Director for [facility] Pulmonary Rehabilitation. ?

## 2021-07-07 ENCOUNTER — Encounter: Payer: Medicare HMO | Attending: Cardiovascular Disease

## 2021-07-07 DIAGNOSIS — Z955 Presence of coronary angioplasty implant and graft: Secondary | ICD-10-CM | POA: Insufficient documentation

## 2021-07-07 NOTE — Progress Notes (Signed)
Completed initial RD consultation ?

## 2021-07-08 DIAGNOSIS — E118 Type 2 diabetes mellitus with unspecified complications: Secondary | ICD-10-CM | POA: Diagnosis not present

## 2021-07-08 DIAGNOSIS — G8929 Other chronic pain: Secondary | ICD-10-CM | POA: Diagnosis not present

## 2021-07-08 DIAGNOSIS — M25511 Pain in right shoulder: Secondary | ICD-10-CM | POA: Diagnosis not present

## 2021-07-08 DIAGNOSIS — I2510 Atherosclerotic heart disease of native coronary artery without angina pectoris: Secondary | ICD-10-CM | POA: Diagnosis not present

## 2021-07-08 DIAGNOSIS — I10 Essential (primary) hypertension: Secondary | ICD-10-CM | POA: Diagnosis not present

## 2021-07-10 DIAGNOSIS — Z955 Presence of coronary angioplasty implant and graft: Secondary | ICD-10-CM

## 2021-07-10 NOTE — Progress Notes (Signed)
Daily Session Note ? ?Patient Details  ?Name: Tiffany Alexander ?MRN: [id number] ?Date of Birth: 04/18/1945 ?Referring Provider:   ?Flowsheet Row Cardiac Rehab from 05/22/2021 in [facility] Cardiac and Pulmonary Rehab  ?Referring Provider Arida, Muhammad MD  ? ?  ? ? ?Encounter Date: 07/10/2021 ? ?Check In: ? Session Check In - 07/10/21 0746   ? ?  ? Check-In  ? Supervising physician immediately available to respond to emergencies See telemetry face sheet for immediately available ER MD   ? Location [facility]-Cardiac & Pulmonary Rehab   ? Staff Present Kelly Bollinger, MPA, RN;Joseph Hood, RCP,RRT,BSRT;Melissa Caiola, RDN, LDN;Laureen Brown, BS, RRT, CPFT   ? Virtual Visit No   ? Medication changes reported     No   ? Fall or balance concerns reported    No   ? Warm-up and Cool-down Performed on first and last piece of equipment   ? Resistance Training Performed Yes   ? VAD Patient? No   ? PAD/SET Patient? No   ?  ? Pain Assessment  ? Currently in Pain? No/denies   ? ?  ?  ? ?  ? ? ? ? ? ?Social History  ? ?Tobacco Use  ?Smoking Status Never  ?Smokeless Tobacco Never  ? ? ?Goals Met:  ?Independence with exercise equipment ?Exercise tolerated well ?No report of concerns or symptoms today ?Strength training completed today ? ?Goals Unmet:  ?Not Applicable ? ?Comments: Pt able to follow exercise prescription today without complaint.  Will continue to monitor for progression. ? ? ? ?Dr. Mark Miller is Medical Director for [facility] Cardiac Rehabilitation.  ?Dr. Fuad Aleskerov is Medical Director for [facility] Pulmonary Rehabilitation. ?

## 2021-07-15 DIAGNOSIS — Z955 Presence of coronary angioplasty implant and graft: Secondary | ICD-10-CM

## 2021-07-15 NOTE — Progress Notes (Signed)
Daily Session Note ? ?Patient Details  ?Name: Tiffany Alexander ?MRN: [id number] ?Date of Birth: 07/30/1945 ?Referring Provider:   ?Flowsheet Row Cardiac Rehab from 05/22/2021 in [facility] Re[facility]  ?Referring Provider Arida, Muhammad MD  ? ?  ? ? ?Encounter Date: 07/15/2021 ? ?Check In: ? Session Check In - 07/15/21 0742   ? ?  ? Check-In  ? Supervising physician immediately available to respond to emergencies See telemetry face sheet for immediately available ER MD   ? Location [facility]   ? Staff Present Kelly Bollinger, MPA, RN;Kelly Hayes, BS, ACSM CEP, Exercise Physiologist;Jessica Hawkins, MA, RCEP, CCRP, CCET   ? Virtual Visit No   ? Medication changes reported     No   ? Fall or balance concerns reported    No   ? Warm-up and Cool-down Performed on first and last piece of equipment   ? Resistance Training Performed Yes   ? VAD Patient? No   ? PAD/SET Patient? No   ?  ? Pain Assessment  ? Currently in Pain? No/denies   ? ?  ?  ? ?  ? ? ? ? ? ?Social History  ? ?Tobacco Use  ?Smoking Status Never  ?Smokeless Tobacco Never  ? ? ?Goals Met:  ?Independence with exercise equipment ?Exercise tolerated well ?No report of concerns or symptoms today ?Strength training completed today ? ?Goals Unmet:  ?Not Applicable ? ?Comments: Pt able to follow exercise prescription today without complaint.  Will continue to monitor for progression. ? ? ? ?Dr. Mark Miller is Medical Director for [facility].  ?Dr. Fuad Aleskerov is Medical Director for [facility]. ?

## 2021-07-22 DIAGNOSIS — Z955 Presence of coronary angioplasty implant and graft: Secondary | ICD-10-CM

## 2021-07-22 NOTE — Progress Notes (Signed)
Daily Session Note ? ?Patient Details  ?Name: Tiffany Alexander ?MRN: [id number] ?Date of Birth: 1945-04-27 ?Referring Provider:   ?Flowsheet Row Cardiac Rehab from 05/22/2021 in [facility] Cardiac and Pulmonary Rehab  ?Referring Provider Kathlyn Sacramento MD  ? ?  ? ? ?Encounter Date: 07/22/2021 ? ?Check In: ? Session Check In - 07/22/21 0739   ? ?  ? Check-In  ? Supervising physician immediately available to respond to emergencies See telemetry face sheet for immediately available ER MD   ? Location [facility]   ? Staff Present Birdie Sons, MPA, RN;Jessica [place], MA, RCEP, CCRP, [place], BS, ACSM CEP, Exercise Physiologist   ? Virtual Visit No   ? Medication changes reported     No   ? Fall or balance concerns reported    No   ? Warm-up and Cool-down Performed on first and last piece of equipment   ? Resistance Training Performed Yes   ? VAD Patient? No   ? PAD/SET Patient? No   ?  ? Pain Assessment  ? Currently in Pain? No/denies   ? ?  ?  ? ?  ? ? ? ? ? ?Social History  ? ?Tobacco Use  ?Smoking Status Never  ?Smokeless Tobacco Never  ? ? ?Goals Met:  ?Independence with exercise equipment ?Exercise tolerated well ?No report of concerns or symptoms today ?Strength training completed today ? ?Goals Unmet:  ?Not Applicable ? ?Comments: Pt able to follow exercise prescription today without complaint.  Will continue to monitor for progression. ? ? ? ?Dr. Emily Filbert is Medical Director for [place].  ?Dr. Ottie Glazier is Medical Director for [facility]. ?

## 2021-07-23 ENCOUNTER — Encounter: Payer: Self-pay | Admitting: *Deleted

## 2021-07-23 DIAGNOSIS — Z955 Presence of coronary angioplasty implant and graft: Secondary | ICD-10-CM

## 2021-07-23 NOTE — Progress Notes (Signed)
Cardiac Individual Treatment Plan ? ?Patient Details  ?Name: Tiffany Alexander ?MRN: [id number] ?Date of Birth: [birth year] ?Referring Provider:   ?Flowsheet Row Cardiac Rehab from 05/22/2021 in [facility] Cardiac and Pulmonary Rehab  ?Referring Provider Kathlyn Sacramento MD  ? ?  ? ? ?Initial Encounter Date:  ?Flowsheet Row Cardiac Rehab from 05/22/2021 in [facility] Cardiac and Pulmonary Rehab  ?Date 05/22/21  ? ?  ? ? ?Visit Diagnosis: Status post coronary artery stent placement ? ?Patient's Home Medications on Admission: ? ?Current Outpatient Medications:  ?  albuterol (VENTOLIN HFA) 108 (90 Base) MCG/ACT inhaler, 2 puffs Q4H PRN, Disp: 18 g, Rfl: 0 ?  amLODipine (NORVASC) 10 MG tablet, Take 10 mg by mouth at bedtime., Disp: , Rfl:  ?  ascorbic acid (VITAMIN C) 500 MG tablet, Take 500 mg by mouth daily., Disp: , Rfl:  ?  aspirin 81 MG chewable tablet, Chew 1 tablet (81 mg total) by mouth daily., Disp: 30 tablet, Rfl: 11 ?  budesonide (PULMICORT) 0.25 MG/2ML nebulizer solution, Take 2 mLs (0.25 mg total) by nebulization 2 (two) times daily., Disp: 120 mL, Rfl: 6 ?  calcium citrate-vitamin D (CITRACAL+D) 315-200 MG-UNIT tablet, Take 1 tablet by mouth 2 (two) times daily., Disp: , Rfl:  ?  carvedilol (COREG) 25 MG tablet, Take 1 tablet (25 mg total) by mouth 2 (two) times daily., Disp: 180 tablet, Rfl: 3 ?  cephALEXin (KEFLEX) 250 MG capsule, Take 250 mg by mouth daily. As needed for UTI, Disp: , Rfl:  ?  Cholecalciferol (VITAMIN D3) 1000 units CAPS, Take by mouth daily., Disp: , Rfl:  ?  citalopram (CELEXA) 20 MG tablet, Take by mouth., Disp: , Rfl:  ?  clopidogrel (PLAVIX) 75 MG tablet, Take 1 tablet (75 mg total) by mouth daily with breakfast., Disp: 90 tablet, Rfl: 3 ?  Cyanocobalamin (VITAMIN B 12 PO), Take 1 tablet by mouth daily., Disp: , Rfl:  ?  diphenhydrAMINE (BENADRYL) 25 MG tablet, Take 75 mg by mouth daily as needed for allergies., Disp: , Rfl:  ?  diphenoxylate-atropine (LOMOTIL) 2.5-0.025 MG tablet, Take 1 tablet by  mouth 2 (two) times daily as needed for diarrhea or loose stools., Disp: , Rfl:  ?  fluticasone (FLONASE) 50 MCG/ACT nasal spray, Place 2 sprays into both nostrils 2 (two) times daily., Disp: , Rfl:  ?  furosemide (LASIX) 20 MG tablet, Take 20 mg by mouth daily., Disp: , Rfl:  ?  glipiZIDE (GLUCOTROL XL) 5 MG 24 hr tablet, Take 5 mg by mouth daily with breakfast., Disp: , Rfl:  ?  hydrALAZINE (APRESOLINE) 50 MG tablet, Take 3 tablets (150 mg) by mouth TWICE daily (or every 12 hours) (Patient taking differently: Take 2 tablets in the am & 1 tablet in the pm), Disp: 180 tablet, Rfl: 6 ?  hydrOXYzine (ATARAX/VISTARIL) 10 MG tablet, Take 10 mg by mouth every 8 (eight) hours as needed for itching., Disp: , Rfl:  ?  Lansoprazole (PREVACID PO), Take by mouth., Disp: , Rfl:  ?  losartan (COZAAR) 100 MG tablet, Take 100 mg by mouth at bedtime., Disp: , Rfl:  ?  Melatonin 10 MG CAPS, Take 20 mg by mouth at bedtime., Disp: , Rfl:  ?  Multiple Vitamins-Minerals (WOMENS MULTIVITAMIN PO), Take 1 tablet by mouth daily., Disp: , Rfl:  ?  ondansetron (ZOFRAN-ODT) 4 MG disintegrating tablet, Take 1 tablet (4 mg total) by mouth every 8 (eight) hours as needed for nausea or vomiting., Disp: 20 tablet, Rfl: 0 ?  PERFOROMIST 20 MCG/2ML nebulizer solution, USE 1 VIAL VIA NEBULIZER AND INHALE BY MOUTH 2 TIMES A DAY, Disp: 120 mL, Rfl: 11 ?  Probiotic Product (PROBIOTIC BLEND PO), Take 1 capsule by mouth daily., Disp: , Rfl:  ?  revefenacin (YUPELRI) 175 MCG/3ML nebulizer solution, Take 3 mLs (175 mcg total) by nebulization daily., Disp: 90 mL, Rfl: 0 ?  rosuvastatin (CRESTOR) 20 MG tablet, Take 1 tablet (20 mg total) by mouth at bedtime., Disp: 30 tablet, Rfl: 5 ?  traMADol (ULTRAM) 50 MG tablet, Take 50 mg by mouth 3 (three) times daily as needed for moderate pain., Disp: , Rfl:  ?  traZODone (DESYREL) 150 MG tablet, Take 150 mg by mouth at bedtime., Disp: , Rfl:  ?No current facility-administered medications for this  visit. ? ?Facility-Administered Medications Ordered in Other Visits:  ?  albuterol (PROVENTIL) (2.5 MG/3ML) 0.083% nebulizer solution 2.5 mg, 2.5 mg, Nebulization, Once, Wilhelmina Mcardle, MD ? ?Past Medical History: ?Past Medical History:  ?Diagnosis Date  ? Arthritis   ? osteoarthritis  ? Carpal tunnel syndrome of left wrist   ? Depression   ? Diabetes mellitus without complication ([place])   ? Diverticulosis   ? GERD (gastroesophageal reflux disease)   ? Hypertension   ? PONV (postoperative nausea and vomiting)   ? PONV (postoperative nausea and vomiting)   ? Pulmonary fibrosis ([place])   ? Recurrent UTI   ? SUI (stress urinary incontinence, female)   ? Wears dentures   ? full upper and lower  ? ? ?Tobacco Use: ?Social History  ? ?Tobacco Use  ?Smoking Status Never  ?Smokeless Tobacco Never  ? ? ?Labs: ?Review Flowsheet   ? ?    ? View : No data to display.  ?  ?  ?  ?  ?  ? ? ? ?Exercise Target Goals: ?Exercise Program Goal: ?Individual exercise prescription set using results from initial 6 min walk test and THRR while considering  patient?s activity barriers and safety.  ? ?Exercise Prescription Goal: ?Initial exercise prescription builds to 30-45 minutes a day of aerobic activity, 2-3 days per week.  Home exercise guidelines will be given to patient during program as part of exercise prescription that the participant will acknowledge. ? ? ?Education: Aerobic Exercise: ?- Group verbal and visual presentation on the components of exercise prescription. Introduces F.I.T.T principle from ACSM for exercise prescriptions.  Reviews F.I.T.T. principles of aerobic exercise including progression. Written material given at graduation. ?Flowsheet Row Cardiac Rehab from 05/22/2021 in [facility] Cardiac and Pulmonary Rehab  ?Education need identified 05/22/21  ? ?  ? ? ?Education: Resistance Exercise: ?- Group verbal and visual presentation on the components of exercise prescription. Introduces F.I.T.T principle from ACSM for exercise  prescriptions  Reviews F.I.T.T. principles of resistance exercise including progression. Written material given at graduation. ? ?  ?Education: Exercise & Equipment Safety: ?- Individual verbal instruction and demonstration of equipment use and safety with use of the equipment. ?Flowsheet Row Cardiac Rehab from 05/22/2021 in [facility] Cardiac and Pulmonary Rehab  ?Education need identified 05/22/21  ?Date 05/22/21  ?Educator KL  ?Instruction Review Code 1- Verbalizes Understanding  ? ?  ? ? ?Education: Exercise Physiology & General Exercise Guidelines: ?- Group verbal and written instruction with models to review the exercise physiology of the cardiovascular system and associated critical values. Provides general exercise guidelines with specific guidelines to those with heart or lung disease.  ?Flowsheet Row Cardiac Rehab from 05/22/2021 in [facility] Cardiac and Pulmonary Rehab  ?  Education need identified 05/22/21  ? ?  ? ? ?Education: Flexibility, Balance, Mind/Body Relaxation: ?- Group verbal and visual presentation with interactive activity on the components of exercise prescription. Introduces F.I.T.T principle from ACSM for exercise prescriptions. Reviews F.I.T.T. principles of flexibility and balance exercise training including progression. Also discusses the mind body connection.  Reviews various relaxation techniques to help reduce and manage stress (i.e. Deep breathing, progressive muscle relaxation, and visualization). Balance handout provided to take home. Written material given at graduation. ? ? ?Activity Barriers & Risk Stratification: ? Activity Barriers & Cardiac Risk Stratification - 05/22/21 0939   ? ?  ? Activity Barriers & Cardiac Risk Stratification  ? Activity Barriers Balance Concerns;Arthritis;Joint Problems;Back Problems;Deconditioning;Muscular [employer] Device;Shortness of Breath   ? Cardiac Risk Stratification Moderate   ? ?  ?  ? ?  ? ? ?6 Minute Walk: ? 6 Minute Walk   ? ? [place] Name  05/22/21 0940  ?  ?  ?  ? 6 Minute Walk  ? Phase Initial    ? Distance 435 feet    ? Walk Time 5.25 minutes  Break 3:45-4:25    ? # of Rest Breaks 1    ? MPH 0.94    ? METS 0.46    ? RPE 13    ? Perceived Dyspnea  2    ? VO2 Peak 1.

## 2021-07-29 DIAGNOSIS — Z955 Presence of coronary angioplasty implant and graft: Secondary | ICD-10-CM

## 2021-07-29 NOTE — Progress Notes (Signed)
Daily Session Note  Patient Details  Name: Tiffany Alexander MRN: [id number] Date of Birth: 1945-07-02 Referring Provider:   Flowsheet Row Cardiac Rehab from 05/22/2021 in [facility] Cardiac and Pulmonary Rehab  Referring Provider Kathlyn Sacramento MD       Encounter Date: 07/29/2021  Check In:  Session Check In - 07/29/21 0746       Check-In   Supervising physician immediately available to respond to emergencies See telemetry face sheet for immediately available ER MD    Location [facility]    Staff Present Birdie Sons, MPA, RN;Jessica [place], MA, RCEP, CCRP, Rosalio Macadamia, BS, ACSM CEP, Exercise Physiologist    Virtual Visit No    Medication changes reported     No    Fall or balance concerns reported    No    Warm-up and Cool-down Performed on first and last piece of equipment    Resistance Training Performed Yes    VAD Patient? No    PAD/SET Patient? No      Pain Assessment   Currently in Pain? No/denies                Social History   Tobacco Use  Smoking Status Never  Smokeless Tobacco Never    Goals Met:  Independence with exercise equipment Exercise tolerated well No report of concerns or symptoms today Strength training completed today  Goals Unmet:  Not Applicable  Comments: Pt able to follow exercise prescription today without complaint.  Will continue to monitor for progression.    Dr. Emily Filbert is Medical Director for [place].  Dr. Ottie Glazier is Medical Director for [facility] Pulmonary Rehabilitation.

## 2021-07-31 ENCOUNTER — Encounter: Payer: Medicare HMO | Admitting: *Deleted

## 2021-07-31 DIAGNOSIS — Z955 Presence of coronary angioplasty implant and graft: Secondary | ICD-10-CM | POA: Diagnosis not present

## 2021-07-31 NOTE — Progress Notes (Signed)
Daily Session Note  Patient Details  Name: Tiffany Alexander MRN: [id number] Date of Birth: 02-03-1946 Referring Provider:   Flowsheet Row Cardiac Rehab from 05/22/2021 in [facility] Cardiac and Pulmonary Rehab  Referring Provider Kathlyn Sacramento MD       Encounter Date: 07/31/2021  Check In:  Session Check In - 07/31/21 0816       Check-In   Supervising physician immediately available to respond to emergencies See telemetry face sheet for immediately available ER MD    Location [facility]    Staff Present Heath Lark, RN, BSN, CCRP;Laureen Owens Shark, BS, RRT, CPFT;Kara Eliezer Bottom, MS, ASCM CEP, Exercise Physiologist    Virtual Visit No    Medication changes reported     No    Fall or balance concerns reported    No    Warm-up and Cool-down Performed on first and last piece of equipment    Resistance Training Performed Yes    VAD Patient? No    PAD/SET Patient? No      Pain Assessment   Currently in Pain? No/denies                Social History   Tobacco Use  Smoking Status Never  Smokeless Tobacco Never    Goals Met:  Independence with exercise equipment Exercise tolerated well No report of concerns or symptoms today  Goals Unmet:  Not Applicable  Comments: Pt able to follow exercise prescription today without complaint.  Will continue to monitor for progression.    Dr. Emily Filbert is Medical Director for [place].  Dr. Ottie Glazier is Medical Director for [facility] Pulmonary [facility].

## 2021-08-05 DIAGNOSIS — Z955 Presence of coronary angioplasty implant and graft: Secondary | ICD-10-CM | POA: Diagnosis not present

## 2021-08-05 NOTE — Progress Notes (Signed)
Daily Session Note  Patient Details  Name: LONISHA BOBBY MRN: [id number] Date of Birth: 13-Sep-1945 Referring Provider:   Flowsheet Row Cardiac Rehab from 05/22/2021 in [facility] Cardiac and Pulmonary Rehab  Referring Provider Kathlyn Sacramento MD       Encounter Date: 08/05/2021  Check In:  Session Check In - 08/05/21 0752       Check-In   Supervising physician immediately available to respond to emergencies See telemetry face sheet for immediately available ER MD    Location [facility]    Staff Present Birdie Sons, MPA, RN;Jessica Luan [place], MA, RCEP, CCRP, Rosalio Macadamia, BS, ACSM CEP, Exercise Physiologist    Virtual Visit No    Medication changes reported     No    Fall or balance concerns reported    No    Warm-up and Cool-down Performed on first and last piece of equipment    Resistance Training Performed Yes    VAD Patient? No    PAD/SET Patient? No      Pain Assessment   Currently in Pain? No/denies                Social History   Tobacco Use  Smoking Status Never  Smokeless Tobacco Never    Goals Met:  Independence with exercise equipment Exercise tolerated well No report of concerns or symptoms today Strength training completed today  Goals Unmet:  Not Applicable  Comments: Pt able to follow exercise prescription today without complaint.  Will continue to monitor for progression.    Dr. Emily Filbert is Medical Director for [place].  Dr. Ottie Glazier is Medical Director for [facility] Pulmonary Rehabilitation.

## 2021-08-07 ENCOUNTER — Encounter: Payer: Medicare HMO | Attending: Cardiovascular Disease

## 2021-08-07 DIAGNOSIS — Z955 Presence of coronary angioplasty implant and graft: Secondary | ICD-10-CM | POA: Diagnosis not present

## 2021-08-07 DIAGNOSIS — Z48812 Encounter for surgical aftercare following surgery on the circulatory system: Secondary | ICD-10-CM | POA: Insufficient documentation

## 2021-08-07 DIAGNOSIS — E119 Type 2 diabetes mellitus without complications: Secondary | ICD-10-CM | POA: Insufficient documentation

## 2021-08-07 NOTE — Progress Notes (Signed)
Daily Session Note  Patient Details  Name: Tiffany Alexander MRN: [id number] Date of Birth: Oct 02, 1945 Referring Provider:   Flowsheet Row Cardiac Rehab from 05/22/2021 in [facility] Cardiac and Pulmonary Rehab  Referring Provider Kathlyn Sacramento MD       Encounter Date: 08/07/2021  Check In:  Session Check In - 08/07/21 0802       Check-In   Supervising physician immediately available to respond to emergencies See telemetry face sheet for immediately available ER MD    Location [facility]    Staff Present Birdie Sons, MPA, Nino Glow, MS, ASCM CEP, Exercise Physiologist;Laureen Owens Shark, BS, RRT, CPFT    Virtual Visit No    Medication changes reported     No    Fall or balance concerns reported    No    Warm-up and Cool-down Performed on first and last piece of equipment    Resistance Training Performed Yes    VAD Patient? No    PAD/SET Patient? No      Pain Assessment   Currently in Pain? No/denies                Social History   Tobacco Use  Smoking Status Never  Smokeless Tobacco Never    Goals Met:  Independence with exercise equipment Exercise tolerated well No report of concerns or symptoms today Strength training completed today  Goals Unmet:  Not Applicable  Comments: Pt able to follow exercise prescription today without complaint.  Will continue to monitor for progression.    Dr. Emily Filbert is Medical Director for [place].  Dr. Ottie Glazier is Medical Director for [facility] Pulmonary Rehabilitation.

## 2021-08-11 ENCOUNTER — Other Ambulatory Visit: Payer: Self-pay | Admitting: Family Medicine

## 2021-08-11 DIAGNOSIS — Z1231 Encounter for screening mammogram for malignant neoplasm of breast: Secondary | ICD-10-CM

## 2021-08-12 ENCOUNTER — Other Ambulatory Visit: Payer: Self-pay

## 2021-08-12 MED ORDER — CARVEDILOL 25 MG PO TABS: 25.0000 mg | ORAL_TABLET | Freq: Two times a day (BID) | ORAL | 0 refills | Status: DC

## 2021-08-12 MED ORDER — ASPIRIN 81 MG PO CHEW: 81.0000 mg | CHEWABLE_TABLET | Freq: Every day | ORAL | 0 refills | Status: AC

## 2021-08-14 DIAGNOSIS — Z955 Presence of coronary angioplasty implant and graft: Secondary | ICD-10-CM

## 2021-08-14 DIAGNOSIS — Z48812 Encounter for surgical aftercare following surgery on the circulatory system: Secondary | ICD-10-CM | POA: Diagnosis not present

## 2021-08-14 DIAGNOSIS — E119 Type 2 diabetes mellitus without complications: Secondary | ICD-10-CM | POA: Diagnosis not present

## 2021-08-14 NOTE — Progress Notes (Signed)
Daily Session Note  Patient Details  Name: Tiffany Alexander MRN: [id number] Date of Birth: 11-Nov-1945 Referring Provider:   Flowsheet Row Cardiac Rehab from 05/22/2021 in [facility] Cardiac and Pulmonary Rehab  Referring Provider Kathlyn Sacramento MD       Encounter Date: 08/14/2021  Check In:  Session Check In - 08/14/21 0626       Check-In   Supervising physician immediately available to respond to emergencies See telemetry face sheet for immediately available ER MD    Location [facility]    Staff Present Justin Mend, Sharren [place], MS, ASCM CEP, Exercise Physiologist;Kyrstyn Greear Rosalia Hammers, MPA, RN    Virtual Visit No    Medication changes reported     No    Fall or balance concerns reported    No    Warm-up and Cool-down Performed on first and last piece of equipment    Resistance Training Performed Yes    VAD Patient? No    PAD/SET Patient? No      Pain Assessment   Currently in Pain? No/denies                Social History   Tobacco Use  Smoking Status Never  Smokeless Tobacco Never    Goals Met:  Independence with exercise equipment Exercise tolerated well No report of concerns or symptoms today Strength training completed today  Goals Unmet:  Not Applicable  Comments: Pt able to follow exercise prescription today without complaint.  Will continue to monitor for progression.    Dr. Emily Filbert is Medical Director for [place].  Dr. Ottie Glazier is Medical Director for [facility] Pulmonary Rehabilitation.

## 2021-08-15 DIAGNOSIS — M5416 Radiculopathy, lumbar region: Secondary | ICD-10-CM | POA: Diagnosis not present

## 2021-08-15 DIAGNOSIS — M48062 Spinal stenosis, lumbar region with neurogenic claudication: Secondary | ICD-10-CM | POA: Diagnosis not present

## 2021-08-18 ENCOUNTER — Ambulatory Visit: Payer: Medicare HMO | Admitting: Pulmonary Disease

## 2021-08-18 ENCOUNTER — Encounter: Payer: Self-pay | Admitting: Pulmonary Disease

## 2021-08-18 VITALS — BP 122/70 | HR 64 | Temp 97.7°F | Ht 61.5 in | Wt 200.2 lb

## 2021-08-18 DIAGNOSIS — J4540 Moderate persistent asthma, uncomplicated: Secondary | ICD-10-CM | POA: Diagnosis not present

## 2021-08-18 DIAGNOSIS — G259 Extrapyramidal and movement disorder, unspecified: Secondary | ICD-10-CM

## 2021-08-18 DIAGNOSIS — J8410 Pulmonary fibrosis, unspecified: Secondary | ICD-10-CM | POA: Diagnosis not present

## 2021-08-18 DIAGNOSIS — I208 Other forms of angina pectoris: Secondary | ICD-10-CM | POA: Diagnosis not present

## 2021-08-18 NOTE — Patient Instructions (Signed)
Continue your medications as they are.   We will see you in follow-up in 4 months time call sooner should any new problems arise.

## 2021-08-18 NOTE — Progress Notes (Signed)
Subjective:    Patient ID: Tiffany Alexander, female    DOB: October 14, 1945, 76 y.o.   MRN: [id number] Patient Care Team: [place] Pink, MD as PCP - General (Family Medicine) Wellington Hampshire, MD as Consulting Physician (Cardiology)  Chief Complaint  Patient presents with   Follow-up    No current sx.    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  She was last seen on 13 May 2021.  This is a scheduled visit.  She follows here for the issue of her asthmatic bronchitis.  Recall that she had been complaining of increasing dyspnea since around September with no worsening on her pulmonary function and dyspnea/fatigue out of proportion to her lung function.  She was referred to cardiology and subsequently cardiac catheterization was done revealing 90% RCA occlusion, patient required PCI to the RCA.  Since that intervention her shortness of breath has been markedly improved to nonexistent.  She is on Pulmicort and Perforomist (formoterol) via nebulizer as well as needed albuterol.  She notes that she hardly has to use her albuterol at all.  Previously she endorsed that other people sometimes will tell her that she would be "wheezing" since she had the PCI to the RCA this has not occurred.  On her prior PFTs there is a suggestion of possible vocal cord dysfunction.  She has issues with akathisia she has not been evaluated by neurology as previously requested.  She has not had any fevers, chills or sweats.  No increased cough or sputum production.  No hemoptysis.  She does not endorse any complaint today.  Overall she feels well and looks well.  DATA 07/08/17 PFTs: FVC 2.61 L, 98% predicted.  FEV1 2.17 L, 106% predicted.  FEV1 ratio 83%.  TLC 77% predicted.  DLCO 71% predicted.  DLCO/VA 125% predicted 08/03/17 HRCT chest: Very mild nonspecific interstitial changes with basilar predominance.   Multiple tiny pulmonary nodules scattered throughout the lungs bilaterally measuring 5 mm or less in size, nonspecific but statistically likely benign.  01/04/18 PFTs: FVC: 2.56 > 2.63 L (97 > 99 %pred), FEV1: 2.08 > 2.13 L (102 > 105 %pred), FEV1/FVC: 81%, TLC: 4.29 L (87 %pred), DLCO 87 %pred 11/08/2018: Eastern allergen profile was negative, total IgE not done.  CBC with differential showed no eosinophilia. 08/24/2019 PFTs: FEV1 1.88 L or 93% predicted, FEV1/FVC 86%.  No bronchodilator response. Volumes normal with the exception of low ERV consistent with obesity.  Patient capacity normal. Overall stable study when compared to previous. 12/19/2020 PFTs: FEV1 1.89 L or 97% predicted, FVC 2.28 L or 87% predicted, FEV1/FVC 74%, no bronchodilator response.  Lung volumes normal, ERV low consistent with obesity.  Diffusion capacity normal.  Overall stable study. 12/27/2020 high resolution CT: Fibrotic changes that remain extremely mild at this time, multiple small stable 5 mm nodules.  Coronary calcifications 01/09/2021 echocardiogram: LVEF 60 to 65% grade 1 DD, normal right ventricular function, no pulmonary hypertension.  Aortic sclerosis without stenosis. 04/24/2021 cardiac CT: Discrepancy between significant stenosis in the RCA and FFR (Fractional Flow Reserve), cardiac cath recommended. 05/05/2021 left heart cath and PCI: Significant finding of 90% stenosis of the RCA, patient required drug-eluting stent to RCA.  Mild to moderate disease affecting left coronary artery system, will require medical therapy, LVEF 55 to 65%.   Review of Systems A 10 point review of systems was performed and it is as noted above otherwise  negative.  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[place]) 05/05/2021   Lymphedema 09/25/2020   Chronic venous insufficiency 09/25/2020   Recurrent urinary tract infection 11/25/2017   Coronary artery disease involving native coronary artery of native heart  08/24/2017   Hyperlipidemia, mixed 08/24/2017   SOBOE (shortness of breath on exertion) 08/24/2017   Positive ANA (antinuclear antibody) 08/20/2017   Pulmonary fibrosis ([place]) 08/20/2017   Status post finger joint fusion 07/16/2014   Allergic rhinitis 05/01/2013   Diabetes ([place]) 05/01/2013   HTN (hypertension) 05/01/2013   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   Lower urinary tract infectious disease 04/05/2013   Prolapse of female pelvic organs 11/04/2012   Urge incontinence 06/27/2012   Chronic cystitis 01/05/2012   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/05/2012   Incomplete emptying of bladder 01/05/2012   Social History   Tobacco Use   Smoking status: Never   Smokeless tobacco: Never  Substance Use Topics   Alcohol use: No   Allergies  Allergen Reactions   Codeine Itching   Erythromycin Other (See Comments)    Stomach cramps   Nitrofurantoin Other (See Comments)    Scarring of lung per pt   Penicillins Rash    Has patient had a PCN reaction causing immediate rash, facial/tongue/throat swelling, SOB or lightheadedness with hypotension: Yes Has patient had a PCN reaction causing severe rash involving mucus membranes or skin necrosis: Unknown Has patient had a PCN reaction that required hospitalization No Has patient had a PCN reaction occurring within the last 10 years: No If all of the above answers are "NO", then may proceed with Cephalosporin use.    Current Meds  Medication Sig   albuterol (VENTOLIN HFA) 108 (90 Base) MCG/ACT inhaler 2 puffs Q4H PRN   amLODipine (NORVASC) 10 MG tablet Take 10 mg by mouth at bedtime.   ascorbic acid (VITAMIN C) 500 MG tablet Take 500 mg by mouth daily.   aspirin 81 MG chewable tablet Chew 1 tablet (81 mg total) by mouth daily.   budesonide (PULMICORT) 0.25 MG/2ML nebulizer solution Take 2 mLs (0.25 mg total) by nebulization 2 (two) times daily.   calcium citrate-vitamin D (CITRACAL+D) 315-200 MG-UNIT tablet Take 1 tablet by mouth 2 (two)  times daily.   carvedilol (COREG) 25 MG tablet Take 1 tablet (25 mg total) by mouth 2 (two) times daily.   cephALEXin (KEFLEX) 250 MG capsule Take 250 mg by mouth daily. As needed for UTI   Cholecalciferol (VITAMIN D3) 1000 units CAPS Take by mouth daily.   citalopram (CELEXA) 20 MG tablet Take by mouth.   clopidogrel (PLAVIX) 75 MG tablet Take 1 tablet (75 mg total) by mouth daily with breakfast.   Cyanocobalamin (VITAMIN B 12 PO) Take 1 tablet by mouth daily.   diphenhydrAMINE (BENADRYL) 25 MG tablet Take 75 mg by mouth daily as needed for allergies.   diphenoxylate-atropine (LOMOTIL) 2.5-0.025 MG tablet Take 1 tablet by mouth 2 (two) times daily as needed for diarrhea or loose stools.   fluticasone (FLONASE) 50 MCG/ACT nasal spray Place 2 sprays into both nostrils 2 (two) times daily.   furosemide (LASIX) 20 MG tablet Take 20 mg by mouth daily.   glipiZIDE (GLUCOTROL XL) 5 MG 24 hr tablet Take 5 mg by mouth daily with breakfast.   hydrALAZINE (APRESOLINE) 50 MG tablet Take 3 tablets (150 mg) by mouth TWICE daily (or every 12 hours) (Patient taking differently: Take 2 tablets in the am & 1 tablet in the  pm)   hydrOXYzine (ATARAX/VISTARIL) 10 MG tablet Take 10 mg by mouth every 8 (eight) hours as needed for itching.   Lansoprazole (PREVACID PO) Take by mouth.   losartan (COZAAR) 100 MG tablet Take 100 mg by mouth at bedtime.   Melatonin 10 MG CAPS Take 20 mg by mouth at bedtime.   Multiple Vitamins-Minerals (WOMENS MULTIVITAMIN PO) Take 1 tablet by mouth daily.   ondansetron (ZOFRAN-ODT) 4 MG disintegrating tablet Take 1 tablet (4 mg total) by mouth every 8 (eight) hours as needed for nausea or vomiting.   PERFOROMIST 20 MCG/2ML nebulizer solution USE 1 VIAL VIA NEBULIZER AND INHALE BY MOUTH 2 TIMES A DAY   Probiotic Product (PROBIOTIC BLEND PO) Take 1 capsule by mouth daily.   rosuvastatin (CRESTOR) 20 MG tablet Take 1 tablet (20 mg total) by mouth at bedtime.   traMADol (ULTRAM) 50 MG tablet  Take 50 mg by mouth 3 (three) times daily as needed for moderate pain.   traZODone (DESYREL) 150 MG tablet Take 150 mg by mouth at bedtime.   [DISCONTINUED] revefenacin (YUPELRI) 175 MCG/3ML nebulizer solution Take 3 mLs (175 mcg total) by nebulization daily.   Immunization History  Administered Date(s) Administered   Influenza, High Dose Seasonal PF 12/25/2014, 12/21/2016, 11/25/2017, 12/17/2018   PFIZER Comirnaty(Gray Top)Covid-19 Tri-Sucrose Vaccine 03/27/2019, 04/17/2019   PFIZER(Purple Top)SARS-COV-2 Vaccination 03/27/2019, 04/17/2019   Pneumococcal Conjugate-13 12/25/2014   Pneumococcal Polysaccharide-23 08/17/2017        Objective:   Physical Exam BP 122/70 (BP Location: Left Arm, Cuff Size: Large)   Pulse 64   Temp 97.7 F (36.5 C) (Temporal)   Ht 5' 1.5" (1.562 m)   Wt 200 lb 3.2 oz (90.8 kg)   SpO2 95%   BMI 37.21 kg/m  GENERAL: Awake, alert, obese woman, fully ambulatory, no respiratory distress.  Akathisia noted.  No conversational dyspnea. HEAD: Normocephalic, atraumatic.  EYES: Pupils equal, round, reactive to light.  No scleral icterus.  MOUTH: Poor dentition, oral mucosa moist. NECK: Supple. No thyromegaly. Trachea midline. No JVD.  No adenopathy.  PULMONARY: Excellent air entry bilaterally.  Lungs clear to auscultation bilaterally.  CARDIOVASCULAR: S1 and S2. Regular rate and rhythm.  No rubs, murmurs or gallops heard. GASTROINTESTINAL: Obese abdomen, soft, nondistended. MUSCULOSKELETAL: No joint deformity, no clubbing, no edema.  NEUROLOGIC: No overt focal deficit.  Involuntary athetoid type movements.  No tremor.  Speech is fluent. SKIN: Intact,warm,dry.  On limited exam no rashes. PSYCH: Mood and behavior normal    Assessment & Plan:     ICD-10-CM   1. Moderate persistent asthma without complication  Q33.00    She is compensated on Pulmicort and Perforomist via nebulizer Continue as needed albuterol Follow-up in 4 months time    2. Very mild  pulmonary fibrosis ([facility])  J84.10    This has been stable and nonprogressive Likely related to prior nitrofurantoin use    3. Movement disorder  G25.9    Akathisia Has not been evaluated by neurology Likely related to meds    4. Effort angina ([facility])  I20.8    No issues since PCI to RCA Coronary artery disease Adds complexity to her management Managed by cardiology     Patient appears to be well compensated currently.  We will see her in follow-up in 4 months time she is to contact us prior to that time should any new difficulties arise.   [name], MD Advanced Bronchoscopy P[facility]    *This note was dictated using  voice recognition software/Dragon.  Despite best efforts to proofread, errors can occur which can change the meaning. Any transcriptional errors that result from this process are unintentional and may not be fully corrected at the time of dictation.

## 2021-08-19 ENCOUNTER — Encounter: Payer: Medicare HMO | Admitting: *Deleted

## 2021-08-19 DIAGNOSIS — Z955 Presence of coronary angioplasty implant and graft: Secondary | ICD-10-CM | POA: Diagnosis not present

## 2021-08-19 DIAGNOSIS — Z48812 Encounter for surgical aftercare following surgery on the circulatory system: Secondary | ICD-10-CM | POA: Diagnosis not present

## 2021-08-19 DIAGNOSIS — E119 Type 2 diabetes mellitus without complications: Secondary | ICD-10-CM | POA: Diagnosis not present

## 2021-08-19 NOTE — Progress Notes (Signed)
Daily Session Note  Patient Details  Name: RAYSHAWN VISCONTI MRN: [id number] Date of Birth: 04-23-1945 Referring Provider:   Flowsheet Row Cardiac Rehab from 05/22/2021 in [facility]  Referring Provider Kathlyn Sacramento MD       Encounter Date: 08/19/2021  Check In:  Session Check In - 08/19/21 0740       Check-In   Supervising physician immediately available to respond to emergencies See telemetry face sheet for immediately available ER MD    Location [facility]-Cardiac & Pulmonary Rehab    Staff Present Earlean Shawl, BS, ACSM CEP, Exercise Physiologist;Susanne Bice, RN, BSN, CCRP;Melissa Caiola, RDN, LDN;Jkayla Spiewak Luan Pulling, MA, RCEP, CCRP, CCET    Virtual Visit No    Medication changes reported     No    Warm-up and Cool-down Performed on first and last piece of equipment    Resistance Training Performed Yes    VAD Patient? No    PAD/SET Patient? No      Pain Assessment   Currently in Pain? No/denies                Social History   Tobacco Use  Smoking Status Never  Smokeless Tobacco Never    Goals Met:  Proper associated with RPD/PD & O2 Sat Independence with exercise equipment Exercise tolerated well No report of concerns or symptoms today Strength training completed today  Goals Unmet:  Not Applicable  Comments:Pt able to follow exercise prescription today without complaint.  Will continue to monitor for progression.     Dr. Emily Filbert is Medical Director for [facility].  Dr. Ottie Glazier is Medical Director for [facility].

## 2021-08-20 ENCOUNTER — Encounter: Payer: Self-pay | Admitting: *Deleted

## 2021-08-20 DIAGNOSIS — Z955 Presence of coronary angioplasty implant and graft: Secondary | ICD-10-CM

## 2021-08-20 NOTE — Progress Notes (Signed)
Cardiac Individual Treatment Plan  Patient Details  Name: Tiffany Alexander MRN: [id number] Date of Birth: 1945-09-29 Referring Provider:   Flowsheet Row Cardiac Rehab from 05/22/2021 in [facility] Cardiac and Pulmonary Rehab  Referring Provider Kathlyn Sacramento MD       Initial Encounter Date:  Flowsheet Row Cardiac Rehab from 05/22/2021 in [facility] Cardiac and Pulmonary Rehab  Date 05/22/21       Visit Diagnosis: Status post coronary artery stent placement  Patient's Home Medications on Admission:  Current Outpatient Medications:    albuterol (VENTOLIN HFA) 108 (90 Base) MCG/ACT inhaler, 2 puffs Q4H PRN, Disp: 18 g, Rfl: 0   amLODipine (NORVASC) 10 MG tablet, Take 10 mg by mouth at bedtime., Disp: , Rfl:    ascorbic acid (VITAMIN C) 500 MG tablet, Take 500 mg by mouth daily., Disp: , Rfl:    aspirin 81 MG chewable tablet, Chew 1 tablet (81 mg total) by mouth daily., Disp: 90 tablet, Rfl: 0   budesonide (PULMICORT) 0.25 MG/2ML nebulizer solution, Take 2 mLs (0.25 mg total) by nebulization 2 (two) times daily., Disp: 120 mL, Rfl: 6   calcium citrate-vitamin D (CITRACAL+D) 315-200 MG-UNIT tablet, Take 1 tablet by mouth 2 (two) times daily., Disp: , Rfl:    carvedilol (COREG) 25 MG tablet, Take 1 tablet (25 mg total) by mouth 2 (two) times daily., Disp: 180 tablet, Rfl: 0   cephALEXin (KEFLEX) 250 MG capsule, Take 250 mg by mouth daily. As needed for UTI, Disp: , Rfl:    Cholecalciferol (VITAMIN D3) 1000 units CAPS, Take by mouth daily., Disp: , Rfl:    citalopram (CELEXA) 20 MG tablet, Take by mouth., Disp: , Rfl:    clopidogrel (PLAVIX) 75 MG tablet, Take 1 tablet (75 mg total) by mouth daily with breakfast., Disp: 90 tablet, Rfl: 3   Cyanocobalamin (VITAMIN B 12 PO), Take 1 tablet by mouth daily., Disp: , Rfl:    diphenhydrAMINE (BENADRYL) 25 MG tablet, Take 75 mg by mouth daily as needed for allergies., Disp: , Rfl:    diphenoxylate-atropine (LOMOTIL) 2.5-0.025 MG tablet, Take 1 tablet by  mouth 2 (two) times daily as needed for diarrhea or loose stools., Disp: , Rfl:    fluticasone (FLONASE) 50 MCG/ACT nasal spray, Place 2 sprays into both nostrils 2 (two) times daily., Disp: , Rfl:    furosemide (LASIX) 20 MG tablet, Take 20 mg by mouth daily., Disp: , Rfl:    glipiZIDE (GLUCOTROL XL) 5 MG 24 hr tablet, Take 5 mg by mouth daily with breakfast., Disp: , Rfl:    hydrALAZINE (APRESOLINE) 50 MG tablet, Take 3 tablets (150 mg) by mouth TWICE daily (or every 12 hours) (Patient taking differently: Take 2 tablets in the am & 1 tablet in the pm), Disp: 180 tablet, Rfl: 6   hydrOXYzine (ATARAX/VISTARIL) 10 MG tablet, Take 10 mg by mouth every 8 (eight) hours as needed for itching., Disp: , Rfl:    Lansoprazole (PREVACID PO), Take by mouth., Disp: , Rfl:    losartan (COZAAR) 100 MG tablet, Take 100 mg by mouth at bedtime., Disp: , Rfl:    Melatonin 10 MG CAPS, Take 20 mg by mouth at bedtime., Disp: , Rfl:    Multiple Vitamins-Minerals (WOMENS MULTIVITAMIN PO), Take 1 tablet by mouth daily., Disp: , Rfl:    ondansetron (ZOFRAN-ODT) 4 MG disintegrating tablet, Take 1 tablet (4 mg total) by mouth every 8 (eight) hours as needed for nausea or vomiting., Disp: 20 tablet, Rfl: 0  PERFOROMIST 20 MCG/2ML nebulizer solution, USE 1 VIAL VIA NEBULIZER AND INHALE BY MOUTH 2 TIMES A DAY, Disp: 120 mL, Rfl: 11   Probiotic Product (PROBIOTIC BLEND PO), Take 1 capsule by mouth daily., Disp: , Rfl:    rosuvastatin (CRESTOR) 20 MG tablet, Take 1 tablet (20 mg total) by mouth at bedtime., Disp: 30 tablet, Rfl: 5   traMADol (ULTRAM) 50 MG tablet, Take 50 mg by mouth 3 (three) times daily as needed for moderate pain., Disp: , Rfl:    traZODone (DESYREL) 150 MG tablet, Take 150 mg by mouth at bedtime., Disp: , Rfl:  No current facility-administered medications for this visit.  Facility-Administered Medications Ordered in Other Visits:    albuterol (PROVENTIL) (2.5 MG/3ML) 0.083% nebulizer solution 2.5 mg, 2.5  mg, Nebulization, Once, Wilhelmina Mcardle, MD  Past Medical History: Past Medical History:  Diagnosis Date   Arthritis    osteoarthritis   Carpal tunnel syndrome of left wrist    Depression    Diabetes mellitus without complication (HCC)    Diverticulosis    GERD (gastroesophageal reflux disease)    Hypertension    PONV (postoperative nausea and vomiting)    PONV (postoperative nausea and vomiting)    Pulmonary fibrosis (HCC)    Recurrent UTI    SUI (stress urinary incontinence, female)    Wears dentures    full upper and lower    Tobacco Use: Social History   Tobacco Use  Smoking Status Never  Smokeless Tobacco Never    Labs: Review Flowsheet        No data to display           Exercise Target Goals: Exercise Program Goal: Individual exercise prescription set using results from initial 6 min walk test and THRR while considering  patient's activity barriers and safety.   Exercise Prescription Goal: Initial exercise prescription builds to 30-45 minutes a day of aerobic activity, 2-3 days per week.  Home exercise guidelines will be given to patient during program as part of exercise prescription that the participant will acknowledge.   Education: Aerobic Exercise: - Group verbal and visual presentation on the components of exercise prescription. Introduces F.I.T.T principle from ACSM for exercise prescriptions.  Reviews F.I.T.T. principles of aerobic exercise including progression. Written material given at graduation. Flowsheet Row Cardiac Rehab from 05/22/2021 in [facility] Cardiac and Pulmonary Rehab  Education need identified 05/22/21       Education: Resistance Exercise: - Group verbal and visual presentation on the components of exercise prescription. Introduces F.I.T.T principle from ACSM for exercise prescriptions  Reviews F.I.T.T. principles of resistance exercise including progression. Written material given at graduation.    Education: Exercise &  Equipment Safety: - Individual verbal instruction and demonstration of equipment use and safety with use of the equipment. Flowsheet Row Cardiac Rehab from 05/22/2021 in [facility] Cardiac and Pulmonary Rehab  Education need identified 05/22/21  Date 05/22/21  Educator [place]  Instruction Review Code 1- Verbalizes Understanding       Education: Exercise Physiology & General Exercise Guidelines: - Group verbal and written instruction with models to review the exercise physiology of the cardiovascular system and associated critical values. Provides general exercise guidelines with specific guidelines to those with heart or lung disease.  Flowsheet Row Cardiac Rehab from 05/22/2021 in [facility] Cardiac and Pulmonary Rehab  Education need identified 05/22/21       Education: Flexibility, Balance, Mind/Body Relaxation: - Group verbal and visual presentation with interactive activity on the components of exercise  prescription. Introduces F.I.T.T principle from ACSM for exercise prescriptions. Reviews F.I.T.T. principles of flexibility and balance exercise training including progression. Also discusses the mind body connection.  Reviews various relaxation techniques to help reduce and manage stress (i.e. Deep breathing, progressive muscle relaxation, and visualization). Balance handout provided to take home. Written material given at graduation.   Activity Barriers & Risk Stratification:  Activity Barriers & Cardiac Risk Stratification - 05/22/21 0939       Activity Barriers & Cardiac Risk Stratification   Activity Barriers Balance Concerns;Arthritis;Joint Problems;Back Problems;Deconditioning;Muscular [employer] Device;Shortness of Breath    Cardiac Risk Stratification Moderate             6 Minute Walk:  6 Minute Walk     Row Name 05/22/21 0940         6 Minute Walk   Phase Initial     Distance 435 feet     Walk Time 5.25 minutes  Break 3:45-4:25     # of Rest Breaks 1     MPH 0.94      METS 0.46     RPE 13     Perceived Dyspnea  2     VO2 Peak 1.63     Symptoms Yes (comment)     Comments SOB     Resting HR 62 bpm     Resting BP 154/74     Resting Oxygen Saturation  96 %     Exercise Oxygen Saturation  during 6 min walk 92 %     Max Ex. HR 74 bpm     Max Ex. BP 180/78     2 Minute Post BP 156/74              Oxygen Initial Assessment:   Oxygen Re-Evaluation:   Oxygen Discharge (Final Oxygen Re-Evaluation):   Initial Exercise Prescription:  Initial Exercise Prescription - 05/22/21 1400       Date of Initial Exercise RX and Referring Provider   Date 05/22/21    Referring Provider Kathlyn Sacramento MD      Oxygen   Maintain Oxygen Saturation 88% or higher      Recumbant Bike   Level 1    RPM 60    Minutes 15    METs 1      NuStep   Level 1    SPM 80    Minutes 15    METs 1      Biostep-RELP   Level 1    SPM 50    Minutes 15    METs 1      Track   Laps 5   as tolerated   Minutes 15    METs 1.27      Prescription Details   Frequency (times per week) 2    Duration Progress to 30 minutes of continuous aerobic without signs/symptoms of physical distress      Intensity   THRR 40-80% of Max Heartrate 94 - 126    Ratings of Perceived Exertion 11-13    Perceived Dyspnea 0-4      Progression   Progression Continue to progress workloads to maintain intensity without signs/symptoms of physical distress.      Resistance Training   Training Prescription Yes    Weight 2 lb    Reps 10-15             Perform Capillary Blood Glucose checks as needed.  Exercise Prescription Changes:   Exercise Prescription Changes     Row  Name 05/22/21 1400 06/02/21 1100 06/12/21 0800 07/01/21 1600 07/28/21 1600     Response to Exercise   Blood Pressure (Admit) 154/74 110/58 -- 138/68 122/82   Blood Pressure (Exercise) 180/78 132/70 -- 142/64 --   Blood Pressure (Exit) 156/74 124/62 -- 120/62 124/70   Heart Rate (Admit) 62 bpm 66 bpm --  55 bpm 60 bpm   Heart Rate (Exercise) 74 bpm 75 bpm -- 86 bpm 73 bpm   Heart Rate (Exit) 66 bpm 65 bpm -- 72 bpm 56 bpm   Oxygen Saturation (Admit) 96 % -- -- -- --   Oxygen Saturation (Exercise) 92 % -- -- -- --   Oxygen Saturation (Exit) 95 % -- -- -- --   Rating of Perceived Exertion (Exercise) 13 14 -- 12 12   Perceived Dyspnea (Exercise) 2 -- -- -- --   Symptoms SOB none -- fatigue fatigue   Comments walk test results 1st full day of exercise -- -- --   Duration -- Progress to 30 minutes of  aerobic without signs/symptoms of physical distress Progress to 30 minutes of  aerobic without signs/symptoms of physical distress Progress to 30 minutes of  aerobic without signs/symptoms of physical distress Progress to 30 minutes of  aerobic without signs/symptoms of physical distress   Intensity -- THRR unchanged THRR unchanged THRR unchanged THRR unchanged     Progression   Progression -- Continue to progress workloads to maintain intensity without signs/symptoms of physical distress. Continue to progress workloads to maintain intensity without signs/symptoms of physical distress. Continue to progress workloads to maintain intensity without signs/symptoms of physical distress. Continue to progress workloads to maintain intensity without signs/symptoms of physical distress.   Average METs -- 1.1 1.1 1.95 1.88     Resistance Training   Training Prescription -- Yes Yes Yes Yes   Weight -- 2 lb 2 lb 2 lb 2 lb   Reps -- 10-15 10-15 10-15 10-15     Interval Training   Interval Training -- No No No No     Recumbant Bike   Level -- -- -- 1 1   Watts -- -- -- -- 8   Minutes -- -- -- 15 15   METs -- -- -- 3.1 3     NuStep   Level -- _0 Minutes -- _1 METs -- 1.1 1.1 1.6 2     Biostep-RELP   Level -- 1 1 -- --   Minutes -- 15 15 -- --   METs -- 1 1 -- --     Track   Laps -- -- -- -- 3   Minutes -- -- -- -- 15   METs -- -- -- -- 1.3     Home Exercise Plan   Plans to  continue exercise at -- -- Home (comment)  at 1-2 days a week of home exercies (walking, stretching, stregthening) at home when not in rehab. Home (comment)  at 1-2 days a week of home exercies (walking, stretching, stregthening) at home when not in rehab. Home (comment)  at 1-2 days a week of home exercies (walking, stretching, stregthening) at home when not in rehab.   Frequency -- -- Add 2 additional days to program exercise sessions. Add 2 additional days to program exercise sessions. Add 2 additional days to program exercise sessions.   Initial Home Exercises Provided -- -- 06/12/21 06/12/21 06/12/21     Oxygen   Maintain Oxygen Saturation --  88% or higher 88% or higher 88% or higher 88% or higher    Row Name 08/11/21 0900             Response to Exercise   Blood Pressure (Admit) 122/64       Blood Pressure (Exit) 120/70       Heart Rate (Admit) 60 bpm       Heart Rate (Exercise) 85 bpm       Heart Rate (Exit) 61 bpm       Rating of Perceived Exertion (Exercise) 12       Symptoms none       Duration Continue with 30 min of aerobic exercise without signs/symptoms of physical distress.       Intensity THRR unchanged         Progression   Progression Continue to progress workloads to maintain intensity without signs/symptoms of physical distress.       Average METs 1.78         Resistance Training   Training Prescription Yes       Weight 2 lb       Reps 10-15         Interval Training   Interval Training No         Recumbant Bike   Level 1       Watts 9       Minutes 15       METs 2.31         NuStep   Level 2       Minutes 30       METs 15         Home Exercise Plan   Plans to continue exercise at Home (comment)  at 1-2 days a week of home exercies (walking, stretching, stregthening) at home when not in rehab.       Frequency Add 2 additional days to program exercise sessions.       Initial Home Exercises Provided 06/12/21         Oxygen   Maintain Oxygen  Saturation 88% or higher                Exercise Comments:   Exercise Comments     Row Name 05/27/21 0756           Exercise Comments First full day of exercise!  Patient was oriented to gym and equipment including functions, settings, policies, and procedures.  Patient's individual exercise prescription and treatment plan were reviewed.  All starting workloads were established based on the results of the 6 minute walk test done at initial orientation visit.  The plan for exercise progression was also introduced and progression will be customized based on patient's performance and goals.                Exercise Goals and Review:   Exercise Goals     Row Name 05/22/21 1422             Exercise Goals   Increase Physical Activity Yes       Intervention Provide advice, education, support and counseling about physical activity/exercise needs.;Develop an individualized exercise prescription for aerobic and resistive training based on initial evaluation findings, risk stratification, comorbidities and participant's personal goals.       Expected Outcomes Short Term: Attend rehab on a regular basis to increase amount of physical activity.;Long Term: Add in home exercise to make exercise part of routine and to increase amount of physical activity.;Long  Term: Exercising regularly at least 3-5 days a week.       Increase Strength and Stamina Yes       Intervention Provide advice, education, support and counseling about physical activity/exercise needs.;Develop an individualized exercise prescription for aerobic and resistive training based on initial evaluation findings, risk stratification, comorbidities and participant's personal goals.       Expected Outcomes Short Term: Increase workloads from initial exercise prescription for resistance, speed, and METs.;Short Term: Perform resistance training exercises routinely during rehab and add in resistance training at home;Long Term:  Improve cardiorespiratory fitness, muscular endurance and strength as measured by increased METs and functional capacity (6MWT)       Able to understand and use rate of perceived exertion (RPE) scale Yes       Intervention Provide education and explanation on how to use RPE scale       Expected Outcomes Short Term: Able to use RPE daily in rehab to express subjective intensity level;Long Term:  Able to use RPE to guide intensity level when exercising independently       Able to understand and use Dyspnea scale Yes       Intervention Provide education and explanation on how to use Dyspnea scale       Expected Outcomes Short Term: Able to use Dyspnea scale daily in rehab to express subjective sense of shortness of breath during exertion;Long Term: Able to use Dyspnea scale to guide intensity level when exercising independently       Knowledge and understanding of Target Heart Rate Range (THRR) Yes       Intervention Provide education and explanation of THRR including how the numbers were predicted and where they are located for reference       Expected Outcomes Short Term: Able to state/look up THRR;Long Term: Able to use THRR to govern intensity when exercising independently;Short Term: Able to use daily as guideline for intensity in rehab       Able to check pulse independently Yes       Intervention Provide education and demonstration on how to check pulse in carotid and radial arteries.;Review the importance of being able to check your own pulse for safety during independent exercise       Expected Outcomes Short Term: Able to explain why pulse checking is important during independent exercise;Long Term: Able to check pulse independently and accurately       Understanding of Exercise Prescription Yes       Intervention Provide education, explanation, and written materials on patient's individual exercise prescription       Expected Outcomes Short Term: Able to explain program exercise  prescription;Long Term: Able to explain home exercise prescription to exercise independently                Exercise Goals Re-Evaluation :  Exercise Goals Re-Evaluation     Row Name 05/27/21 0756 06/02/21 1117 06/12/21 0824 07/01/21 1612 07/10/21 0811     Exercise Goal Re-Evaluation   Exercise Goals Review Increase Physical Activity;Able to understand and use rate of perceived exertion (RPE) scale;Knowledge and understanding of Target Heart Rate Range (THRR);Understanding of Exercise Prescription;Able to understand and use Dyspnea scale;Able to check pulse independently;Increase Strength and Stamina Increase Physical Activity;Increase Strength and Stamina Increase Physical Activity;Increase Strength and Stamina;Able to understand and use rate of perceived exertion (RPE) scale;Able to understand and use Dyspnea scale;Knowledge and understanding of Target Heart Rate Range (THRR);Able to check pulse independently;Understanding of Exercise Prescription Increase Physical Activity;Increase  Strength and Stamina;Understanding of Exercise Prescription Increase Physical Activity;Increase Strength and Stamina;Understanding of Exercise Prescription   Comments Reviewed RPE and dyspnea scales, THR and program prescription with pt today.  Pt voiced understanding and was given a copy of goals to take home. Tiffany Alexander completed her first session of rehab and tolerated it well. RPEs were in good range. We will continue to monitor as she progresses throughout the program. Reviewed home exercise with pt today.  Pt plans to walk for exercise.  Reviewed THR, pulse, RPE, sign and symptoms, pulse oximetery and when to call 911 or MD.  Also discussed weather considerations and indoor options.  Pt voiced understanding. Tiffany Alexander returned today after being out for two weeks.  She was able to return to her current workloads without a problem.  We will continue to monitor her progress. Tiffany Alexander reports doing no exercise at home right now,  according to home exercise note, Tiffany Alexander reported she will walk for exercise - she does not remember this. She reports outside is too bumpy to walk, but she can walk inside. She reports not being able to go to a gym for exercise while at rehab because her family doesn't like her driving. She reports not having access to youtube for our exercise videos, but she does still have the exerciuse packet. She reports that her arms are weak and she got a cortisone shot which did not help - discussed how this can alleviate pain, but wont build muscle; discussed importance of resistance exercise.   Expected Outcomes Short: Use RPE daily to regulate intensity. Long: Follow program prescription in THR. Short: Continue to exercise at current exercise prescription Long: Increase overall MET level Short: add 1-2 days of exercise at home on off days of rehab. Long: become independent with exercise program. Short: Return to regular attendance Long; Conintue to improve stamina. ST: Return to regular attendance, exercidse outside of rehab  LT; Conintue to improve stamina.    [place] Name 07/28/21 1558 07/31/21 0827 08/11/21 0932         Exercise Goal Re-Evaluation   Exercise Goals Review Increase Physical Activity;Increase Strength and Stamina;Understanding of Exercise Prescription Increase Physical Activity;Increase Strength and Stamina;Understanding of Exercise Prescription Increase Physical Activity;Increase Strength and Stamina;Understanding of Exercise Prescription     Comments Tiffany Alexander continues to have intermitten attendance due to transportation issues with her husband and his appointments.  We have encouraged her to try to attend reguarly to get the most out of the program, but she has been averaging about once a week.  She is able to walk some with her wheelchair around the track and has started to walk down to rehab pushing the chair.  We will continue to montior her progress. Tiffany Alexander is not exercising at home right now -  discussed walking at home as outside is too bumpy and ustilizing the exercise packet given by the EP during home exercise. We discussed utilizing household itema such as water bottles and canned items that can fit in her hand comfortably. EP spoke with Tiffany Alexander about purchasing well made, cheap weights or resistance bands at [employer], [place], [place], [place] told her she could give her some resistance band handouts and show her how to use them. Tiffany Alexander has been coming when she is able to with transportation. She does not walk at rehab but has started to push her wheelchair down to rehab which is good for her as she was not able to do that before. She continues to work at  level 1 on the recumbant bike and hope to see that increase to level 2. We will continue to monitor.     Expected Outcomes Short: Aim for regular attendance in rehab Long; Conitnue to walk more Short: Aim for regular attendance in rehab, purchase resistance bands and/or weights Long: Conitnue to walk more Short: Continue to push wheelchair down to incorporate walking Long: Increase overall MET level              Discharge Exercise Prescription (Final Exercise Prescription Changes):  Exercise Prescription Changes - 08/11/21 0900       Response to Exercise   Blood Pressure (Admit) 122/64    Blood Pressure (Exit) 120/70    Heart Rate (Admit) 60 bpm    Heart Rate (Exercise) 85 bpm    Heart Rate (Exit) 61 bpm    Rating of Perceived Exertion (Exercise) 12    Symptoms none    Duration Continue with 30 min of aerobic exercise without signs/symptoms of physical distress.    Intensity THRR unchanged      Progression   Progression Continue to progress workloads to maintain intensity without signs/symptoms of physical distress.    Average METs 1.78      Resistance Training   Training Prescription Yes    Weight 2 lb    Reps 10-15      Interval Training   Interval Training No      Recumbant Bike   Level 1    Watts 9     Minutes 15    METs 2.31      NuStep   Level 2    Minutes 30    METs 15      Home Exercise Plan   Plans to continue exercise at Home (comment)   at 1-2 days a week of home exercies (walking, stretching, stregthening) at home when not in rehab.   Frequency Add 2 additional days to program exercise sessions.    Initial Home Exercises Provided 06/12/21      Oxygen   Maintain Oxygen Saturation 88% or higher             Nutrition:  Target Goals: Understanding of nutrition guidelines, daily intake of sodium <1570m, cholesterol <2065m calories 30% from fat and 7% or less from saturated fats, daily to have 5 or more servings of fruits and vegetables.  Education: All About Nutrition: -Group instruction provided by verbal, written material, interactive activities, discussions, models, and posters to present general guidelines for heart healthy nutrition including fat, fiber, MyPlate, the role of sodium in heart healthy nutrition, utilization of the nutrition label, and utilization of this knowledge for meal planning. Follow up email sent as well. Written material given at graduation. [facility]  Education need identified 05/22/21       Biometrics:  Pre Biometrics - 05/22/21 0939       Pre Biometrics   Height 5' 2.5" (1.588 m)    Weight 204 lb 11.2 oz (92.9 kg)    BMI (Calculated) 36.82    Single Leg Stand 0 seconds              Nutrition Therapy Plan and Nutrition Goals:  Nutrition Therapy & Goals - 07/07/21 1427       Nutrition Therapy   Diet Heart healthy, low Na, T2DM    Protein (specify units) 75g    Fiber 25 grams    Whole Grain Foods 3 servings  Saturated Fats 12 max. grams    Fruits and Vegetables 8 servings/day    Sodium 2 grams      Personal Nutrition Goals   Nutrition Goal ST: add at least 1 CHO serving to Breakfast and Lunch to avoid BG lows LT: Maintain A1C <7, follow MyPlate  guidelines, eat consistent CHOs    Comments 76 y.o. F admitted to cardiac rehab s/p coronary artery placement. PMHx of asthma, HTN, T2DM, pulmonary fibrosis, coronary calcification, GERD, diverticulosis. Pt wears dentures. Relevant medications include vit C, Calcium w/ vit D, vit D3, vitamin B12, lasix, glipizide, lomotil, lansoprazole, MVI, zofran, probiotic, crestor, tramadol, trazodone. PYP Score: 58. Vegetables & Fruits 5/12. Breads, Grains & Cereals 7/12. Red & Processed Meat 4/12. Poultry 2/2. Fish & Shellfish 0/4. Beans, Nuts & Seeds 1/4. Milk & Dairy Foods 2/6. Toppings, Oils, Seasonings & Salt 13/20. Sweets, Snacks & Restaurant Food 14/14. Beverages 10/10. Coffee B: biscuit - not as much anymore, 2 eggs and toast (keto bread: 2 slices = 53I CHO, 6g fiber) L: will sometimes skip, sandwich (peanut butter or [place] - keto bread) or leftovers S: she will have low sugar around here D:3-4pm: meat with 2 vegetables. Discussed general heart healthy eating as well as T2DM MNT.      Intervention Plan   Intervention Prescribe, educate and counsel regarding individualized specific dietary modifications aiming towards targeted core components such as weight, hypertension, lipid management, diabetes, heart failure and other comorbidities.    Expected Outcomes Short Term Goal: Understand basic principles of dietary content, such as calories, fat, sodium, cholesterol and nutrients.;Short Term Goal: A plan has been developed with personal nutrition goals set during dietitian appointment.;Long Term Goal: Adherence to prescribed nutrition plan.             Nutrition Assessments:  MEDIFICTS Score Key: ?70 Need to make dietary changes  40-70 Heart Healthy Diet ? 40 Therapeutic Level Cholesterol Diet  Flowsheet Row Cardiac Rehab from 05/22/2021 in [facility] Cardiac and Pul[facility] Rehab  Picture Your Plate Total Score on Admission 58      Picture Your Plate Scores: <14 Unhealthy dietary pattern with much room  for improvement. 41-50 Dietary pattern unlikely to meet recommendations for good health and room for improvement. 51-60 More healthful dietary pattern, with some room for improvement.  >60 Healthy dietary pattern, although there may be some specific behaviors that could be improved.    Nutrition Goals Re-Evaluation:  Nutrition Goals Re-Evaluation     Row Name 06/12/21 0759 07/31/21 0814           Goals   Nutrition Goal -- ST: Continue to make sure meals and snacks have protein, fiber, and fat. Make sure meals have at least 2-3 CHO servings and snacks 1-2. LT: Maintain A1C <7, follow MyPlate guidelines, eat consistent CHOs      Comment Patient has not yet met with program dietician to set nutrition goals. She has a scheduled appointment on June 26, 2021. She reports liking cooking and eating. She has optifast bar as one snack - 4g fiber, 17g CHO, protein 16g. She got a new whole grain bread and eats more vegetables; she is practicing MyPlate structure. She would like to continue with her current changes so far. We discussed some other healthy snacks such as fruit with nuts/seeds - she enjoys fruit, but it runs her BG up; discussed pairing CHO rich foods with protein and fat. She is baking chicken with vinegar - her grandchildren calls it vinegar chicken.  Expected Outcome Short: Meet with program dietician to set nutrition goals. Long: Work towards diet changes given by Tourist information centre manager. ST: Continue to make sure meals and snacks have protein, fiber, and fat. Make sure meals have at least 2-3 CHO servings and snacks 1-2. LT: Maintain A1C <7, follow MyPlate guidelines, eat consistent CHOs               Nutrition Goals Discharge (Final Nutrition Goals Re-Evaluation):  Nutrition Goals Re-Evaluation - 07/31/21 7510       Goals   Nutrition Goal ST: Continue to make sure meals and snacks have protein, fiber, and fat. Make sure meals have at least 2-3 CHO servings and snacks 1-2. LT:  Maintain A1C <7, follow MyPlate guidelines, eat consistent CHOs    Comment She reports liking cooking and eating. She has optifast bar as one snack - 4g fiber, 17g CHO, protein 16g. She got a new whole grain bread and eats more vegetables; she is practicing MyPlate structure. She would like to continue with her current changes so far. We discussed some other healthy snacks such as fruit with nuts/seeds - she enjoys fruit, but it runs her BG up; discussed pairing CHO rich foods with protein and fat. She is baking chicken with vinegar - her grandchildren calls it vinegar chicken.    Expected Outcome ST: Continue to make sure meals and snacks have protein, fiber, and fat. Make sure meals have at least 2-3 CHO servings and snacks 1-2. LT: Maintain A1C <7, follow MyPlate guidelines, eat consistent CHOs             Psychosocial: Target Goals: Acknowledge presence or absence of significant depression and/or stress, maximize coping skills, provide positive support system. Participant is able to verbalize types and ability to use techniques and skills needed for reducing stress and depression.   Education: Stress, Anxiety, and Depression - Group verbal and visual presentation to define topics covered.  Reviews how body is impacted by stress, anxiety, and depression.  Also discusses healthy ways to reduce stress and to treat/manage anxiety and depression.  Written material given at graduation.   Education: Sleep Hygiene -Provides group verbal and written instruction about how sleep can affect your health.  Define sleep hygiene, discuss sleep cycles and impact of sleep habits. Review good sleep hygiene tips.    Initial Review & Psychosocial Screening:  Initial Psych Review & Screening - 05/14/21 1411       Initial Review   Current issues with Current Stress Concerns      Family Dynamics   Good Support System? Yes   daughter     Barriers   Psychosocial barriers to participate in program There are  no identifiable barriers or psychosocial needs.;The patient should benefit from training in stress management and relaxation.      Screening Interventions   Interventions Encouraged to exercise;Provide feedback about the scores to participant;To provide support and resources with identified psychosocial needs    Expected Outcomes Short Term goal: Utilizing psychosocial counselor, staff and physician to assist with identification of specific Stressors or current issues interfering with healing process. Setting desired goal for each stressor or current issue identified.;Long Term Goal: Stressors or current issues are controlled or eliminated.;Short Term goal: Identification and review with participant of any Quality of Life or Depression concerns found by scoring the questionnaire.;Long Term goal: The participant improves quality of Life and PHQ9 Scores as seen by post scores and/or verbalization of changes  Quality of Life Scores:   Quality of Life - 05/22/21 0936       Quality of Life   Select Quality of Life      Quality of Life Scores   Health/Function Pre 21 %    Socioeconomic Pre 24.3 %    Psych/Spiritual Pre 26.57 %    Family Pre 26.4 %    GLOBAL Pre 23.66 %            Scores of 19 and below usually indicate a poorer quality of life in these areas.  A difference of  2-3 points is a clinically meaningful difference.  A difference of 2-3 points in the total score of the Quality of Life Index has been associated with significant improvement in overall quality of life, self-image, physical symptoms, and general health in studies assessing change in quality of life.  PHQ-9: Review Flowsheet       07/15/2021 05/22/2021  Depression screen PHQ 2/9  Decreased Interest 0 0  Down, Depressed, Hopeless 0 2  PHQ - 2 Score 0 2  Altered sleeping 0 0  Tired, decreased energy 1 0  Change in appetite 0 3  Feeling bad or failure about yourself  0 1  Trouble concentrating 0 0   Moving slowly or fidgety/restless 0 0  Suicidal thoughts 0 0  PHQ-9 Score 1 6  Difficult doing work/chores Not difficult at all Somewhat difficult   Interpretation of Total Score  Total Score Depression Severity:  1-4 = Minimal depression, 5-9 = Mild depression, 10-14 = Moderate depression, 15-19 = Moderately severe depression, 20-27 = Severe depression   Psychosocial Evaluation and Intervention:  Psychosocial Evaluation - 05/14/21 1421       Psychosocial Evaluation & Interventions   Interventions Encouraged to exercise with the program and follow exercise prescription;Stress management education    Comments Tiffany Alexander originally was not going to participate in cardiac rehab but changed her mind because the copay was doable and she wants to feel better. She lives at home with her husband and their daughter helps her out. When asked about stress, she stated there was none she could talk about at that moment (due to her husband being in the room with her). She mentioned she hasn't had to reach out to anyone to talk about this stress, but staff told her the opportunity would present itself while she is here in the program if she wants to discuss what is going on. She said she felt safe in whatever the situation is. Her breathing difficulties have improved since after the stent. She still struggles with balance so she takes things slow so she doesn't fall. When asked about using a cane or a walker she mentioned her husband didn't want her to be dependent on one. She is extra careful when out of her normal setting so she doesn't get tripped up. Her daughter accompanies her to most of her apppointments. Tiffany Alexander is hoping this program will help boost her stamina.    Expected Outcomes Short; attend cardiac rehab for education and exercise. Long: develop and maintain positive self care habits.    Continue Psychosocial Services  Follow up required by staff             Psychosocial Re-Evaluation:   Psychosocial Re-Evaluation     Tiffany Alexander Name 06/12/21 0801 06/12/21 0809 07/10/21 0817 07/31/21 1093       Psychosocial Re-Evaluation   Current issues with Current Stress Concerns Current Stress Concerns Current Stress Concerns Current Stress  Concerns    Comments -- Patient reports no new stress or sleep concerns. She reports that some of her chruch friends are terminally and chronically ill and that makes her sad. She will take a nap to help relieve stress. Discussed some stress reducing activities as well as mindful meditation, she reports chores and taking care of her son keeps her busy so she does not have time for anything like that. She relies on her daughter and husband for support. She reports sleeping well. She reports that some of her chruch friends are terminally and chronically ill and that makes her sad, she reports that she has given that to the lord and she will make them food like giving them chicken salad. She would like to make a care package to her granddaughter when she goes to college. She reports her marriage is hard - this is her third marriage. Her son is also autistic and she reports this can be difficult at times. She will take a nap and read her bible to help relieve stress. She will also participate in her church as well to help relieve stress. She relies on her daughter for support - her daughter calls every day. She reports sleeping well.    Expected Outcomes -- Short: continue to attend cardiac rehab consistently for physical and mental health benefits. Long: Maintain good mental health habits and sleep patterns. ST: continue to attend cardiac rehab consistently for physical and mental health benefits. LT: Maintain good mental health habits and sleep patterns. ST: continue to attend cardiac rehab consistently for physical and mental health benefits. LT: Maintain good mental health habits and sleep patterns.    Continue Psychosocial Services  -- -- Follow up required by staff Follow  up required by staff             Psychosocial Discharge (Final Psychosocial Re-Evaluation):  Psychosocial Re-Evaluation - 07/31/21 5093       Psychosocial Re-Evaluation   Current issues with Current Stress Concerns    Comments She reports that some of her chruch friends are terminally and chronically ill and that makes her sad, she reports that she has given that to the lord and she will make them food like giving them chicken salad. She would like to make a care package to her granddaughter when she goes to college. She reports her marriage is hard - this is her third marriage. Her son is also autistic and she reports this can be difficult at times. She will take a nap and read her bible to help relieve stress. She will also participate in her church as well to help relieve stress. She relies on her daughter for support - her daughter calls every day. She reports sleeping well.    Expected Outcomes ST: continue to attend cardiac rehab consistently for physical and mental health benefits. LT: Maintain good mental health habits and sleep patterns.    Continue Psychosocial Services  Follow up required by staff             Vocational Rehabilitation: Provide vocational rehab assistance to qualifying candidates.   Vocational Rehab Evaluation & Intervention:  Vocational Rehab - 05/14/21 1411       Initial Vocational Rehab Evaluation & Intervention   Assessment shows need for Vocational Rehabilitation No             Education: Education Goals: Education classes will be provided on a variety of topics geared toward better understanding of heart health and risk factor modification. Participant  will state understanding/return demonstration of topics presented as noted by education test scores.  Learning Barriers/Preferences:  Learning Barriers/Preferences - 05/14/21 1411       Learning Barriers/Preferences   Learning Barriers None    Learning Preferences Individual Instruction              General Cardiac Education Topics:  AED/CPR: - Group verbal and written instruction with the use of models to demonstrate the basic use of the AED with the basic ABC's of resuscitation.   Anatomy and Cardiac Procedures: - Group verbal and visual presentation and models provide information about basic cardiac anatomy and function. Reviews the testing methods done to diagnose heart disease and the outcomes of the test results. Describes the treatment choices: Medical Management, Angioplasty, or Coronary Bypass Surgery for treating various heart conditions including Myocardial Infarction, Angina, Valve Disease, and Cardiac Arrhythmias.  Written material given at graduation. Flowsheet Row Cardiac Rehab from 05/22/2021 in [facility] Cardiac and Pulmonary Rehab  Education need identified 05/22/21       Medication Safety: - Group verbal and visual instruction to review commonly prescribed medications for heart and lung disease. Reviews the medication, class of the drug, and side effects. Includes the steps to properly store meds and maintain the prescription regimen.  Written material given at graduation.   Intimacy: - Group verbal instruction through game format to discuss how heart and lung disease can affect sexual intimacy. Written material given at graduation..   Know Your Numbers and Heart Failure: - Group verbal and visual instruction to discuss disease risk factors for cardiac and pulmonary disease and treatment options.  Reviews associated critical values for Overweight/Obesity, Hypertension, Cholesterol, and Diabetes.  Discusses basics of heart failure: signs/symptoms and treatments.  Introduces Heart Failure Zone chart for action plan for heart failure.  Written material given at graduation.   Infection Prevention: - Provides verbal and written material to individual with discussion of infection control including proper hand washing and proper equipment cleaning during  exercise session. Flowsheet Row Cardiac Rehab from 05/22/2021 in [facility] Cardiac and Pulmonary Rehab  Education need identified 05/22/21  Date 05/22/21  Educator [place]  Instruction Review Code 1- Verbalizes Understanding       Falls Prevention: - Provides verbal and written material to individual with discussion of falls prevention and safety. Flowsheet Row Cardiac Rehab from 05/22/2021 in [facility] Cardiac and Pulmonary Rehab  Education need identified 05/22/21  Date 05/22/21  Educator [place]  Instruction Review Code 1- Verbalizes Understanding       Other: -Provides group and verbal instruction on various topics (see comments)   Knowledge Questionnaire Score:  Knowledge Questionnaire Score - 05/22/21 0933       Knowledge Questionnaire Score   Pre Score 21/26: Nutrition, Exercise, HR             Core Components/Risk Factors/Patient Goals at Admission:  Personal Goals and Risk Factors at Admission - 05/22/21 1422       Core Components/Risk Factors/Patient Goals on Admission    Weight Management Yes;Weight Loss    Intervention Weight Management: Develop a combined nutrition and exercise program designed to reach desired caloric intake, while maintaining appropriate intake of nutrient and fiber, sodium and fats, and appropriate energy expenditure required for the weight goal.;Weight Management: Provide education and appropriate resources to help participant work on and attain dietary goals.;Weight Management/Obesity: Establish reasonable short term and long term weight goals.    Admit Weight 204 lb (92.5 kg)    Goal Weight: Short  Term 200 lb (90.7 kg)    Goal Weight: Long Term 190 lb (86.2 kg)    Expected Outcomes Short Term: Continue to assess and modify interventions until short term weight is achieved;Long Term: Adherence to nutrition and physical activity/exercise program aimed toward attainment of established weight goal;Weight Loss: Understanding of general recommendations for a  balanced deficit meal plan, which promotes 1-2 lb weight loss per week and includes a negative energy balance of [phone] kcal/d;Understanding recommendations for meals to include 15-35% energy as protein, 25-35% energy from fat, 35-60% energy from carbohydrates, less than 257m of dietary cholesterol, 20-35 gm of total fiber daily;Understanding of distribution of calorie intake throughout the day with the consumption of 4-5 meals/snacks    Diabetes Yes    Intervention Provide education about signs/symptoms and action to take for hypo/hyperglycemia.;Provide education about proper nutrition, including hydration, and aerobic/resistive exercise prescription along with prescribed medications to achieve blood glucose in normal ranges: Fasting glucose 65-99 mg/dL    Expected Outcomes Short Term: Participant verbalizes understanding of the signs/symptoms and immediate care of hyper/hypoglycemia, proper foot care and importance of medication, aerobic/resistive exercise and nutrition plan for blood glucose control.;Long Term: Attainment of HbA1C < 7%.    Hypertension Yes    Intervention Provide education on lifestyle modifcations including regular physical activity/exercise, weight management, moderate sodium restriction and increased consumption of fresh fruit, vegetables, and low fat dairy, alcohol moderation, and smoking cessation.;Monitor prescription use compliance.    Expected Outcomes Short Term: Continued assessment and intervention until BP is < 140/962mHG in hypertensive participants. < 130/8045mG in hypertensive participants with diabetes, heart failure or chronic kidney disease.;Long Term: Maintenance of blood pressure at goal levels.    Lipids Yes    Intervention Provide education and support for participant on nutrition & aerobic/resistive exercise along with prescribed medications to achieve LDL <52m68mDL >40mg9m Expected Outcomes Short Term: Participant states understanding of desired  cholesterol values and is compliant with medications prescribed. Participant is following exercise prescription and nutrition guidelines.;Long Term: Cholesterol controlled with medications as prescribed, with individualized exercise RX and with personalized nutrition plan. Value goals: LDL < 52mg,13m > 40 mg.             Education:Diabetes - Individual verbal and written instruction to review signs/symptoms of diabetes, desired ranges of glucose level fasting, after meals and with exercise. Acknowledge that pre and post exercise glucose checks will be done for 3 sessions at entry of program. [place]  Education need identified 05/22/21  Date 05/22/21  Educator [place]  InErwinruction Review Code 1- Verbalizes Understanding       Core Components/Risk Factors/Patient Goals Review:   Goals and Risk Factor Review     Row Name 06/12/21 0807 07/10/21 0802 07/31/21 0810         Core Components/Risk Factors/Patient Goals Review   Personal Goals Review Weight Management/Obesity;Diabetes;Hypertension;Lipids Weight Management/Obesity;Diabetes;Hypertension;Lipids Weight Management/Obesity;Diabetes;Hypertension;Lipids     Review Patient reports taking all medications as prescribed by her doctor. She monitors BP and BG at home and states she knows what to do if she has abnormal ranges. Most of the time her BP and BG are within acceptable ranges. Her weight have been steady. Tiffany Alexander wKaremto her PCP Tuesday and he lowered her glipizide so she wouldn't have as many BG lows. Discussed nutrition with pt 07/08/21 and reviewed paperwork today. She continues to take her BG at home. She has  a BP cuff and doesn't take it often as it normally runs fine, encouraged her to check it when not at rehab. Today at rehab her BP was 140/70, discussed how this was slightly more elevated than recommended for a resting BP. She continues to take her medications as  prescribed with no reported issues. Tiffany Alexander reports losing a couple of pounds since beginning rehab, she mentioned with her PCP lowering her glipizide she can go longer without eating before having a low; encouraged her to still eat regular meals to meet her nutritional needs, but to choose more health promoting foods. Karen reports doing well on glipizide and reports not as many low BG. She continues to take her BG at home - about 115-120. She has a BP cuff and doesn't take it often as it normally runs fine, encouraged her to check it when not at rehab. Today at rehab her BP was 132/64. She continues to take her medications as prescribed with no reported issues. Tiffany Alexander reports losing about 8 pounds since beginning rehab, she had mentioned with her PCP lowering her glipizide she can go longer without eating before having a low; reviewed that she should still eat regular meals to meet her nutritional needs, but to choose more health promoting foods.     Expected Outcomes Short: continue to take all meds and monitor BP and BG at home. Long: control cardiac risk facotrs with healthy lifestyle changes. ST: continue to take all meds and monitor BP and BG at home. LT: control cardiac risk facotrs with healthy lifestyle changes. ST: continue to take all meds and monitor BP and BG at home. LT: control cardiac risk facotrs with healthy lifestyle changes.              Core Components/Risk Factors/Patient Goals at Discharge (Final Review):   Goals and Risk Factor Review - 07/31/21 0810       Core Components/Risk Factors/Patient Goals Review   Personal Goals Review Weight Management/Obesity;Diabetes;Hypertension;Lipids    Review Tiffany Alexander reports doing well on glipizide and reports not as many low BG. She continues to take her BG at home - about 115-120. She has a BP cuff and doesn't take it often as it normally runs fine, encouraged her to check it when not at rehab. Today at rehab her BP was 132/64. She continues to take  her medications as prescribed with no reported issues. Tiffany Alexander reports losing about 8 pounds since beginning rehab, she had mentioned with her PCP lowering her glipizide she can go longer without eating before having a low; reviewed that she should still eat regular meals to meet her nutritional needs, but to choose more health promoting foods.    Expected Outcomes ST: continue to take all meds and monitor BP and BG at home. LT: control cardiac risk facotrs with healthy lifestyle changes.             ITP Comments:  ITP Comments     Row Name 05/14/21 1426 05/22/21 0926 05/27/21 0756 05/28/21 0828 06/25/21 1321   ITP Comments Initial telephone orientation completed. Diagnosis can be found in [facility] 2/27. EP orientation scheduled for Thursday 3/16 at 8am. Completed 6MWT and gym orientation. Initial ITP created and sent for review to Dr. Emily Filbert, Medical Director. First full day of exercise!  Patient was oriented to gym and equipment including functions, settings, policies, and procedures.  Patient's individual exercise prescription and treatment plan were reviewed.  All starting workloads were established based on the results of the  6 minute walk test done at initial orientation visit.  The plan for exercise progression was also introduced and progression will be customized based on patient's performance and goals. 30 Day review completed. Medical Director ITP review done, changes made as directed, and signed approval by Medical Director.   New to program 30 Day review completed. Medical Director ITP review done, changes made as directed, and signed approval by Medical Director.    [place] Name 07/07/21 1403 07/23/21 0835 08/20/21 1226       ITP Comments Completed initial RD consultation 30 Day review completed. Medical Director ITP review done, changes made as directed, and signed approval by Medical Director. 30 Day review completed. Medical Director ITP review done, changes made as directed, and signed  approval by Medical Director.              Comments:

## 2021-08-21 DIAGNOSIS — E119 Type 2 diabetes mellitus without complications: Secondary | ICD-10-CM | POA: Diagnosis not present

## 2021-08-21 DIAGNOSIS — Z48812 Encounter for surgical aftercare following surgery on the circulatory system: Secondary | ICD-10-CM | POA: Diagnosis not present

## 2021-08-21 DIAGNOSIS — Z955 Presence of coronary angioplasty implant and graft: Secondary | ICD-10-CM

## 2021-08-21 LAB — GLUCOSE, CAPILLARY: Glucose-Capillary: 97 mg/dL (ref 70–99)

## 2021-08-21 NOTE — Progress Notes (Signed)
Daily Session Note  Patient Details  Name: Tiffany Alexander MRN: [id number] Date of Birth: 29-Dec-1945 Referring Provider:   Flowsheet Row Cardiac Rehab from 05/22/2021 in [facility] Cardiac and Pulmonary Rehab  Referring Provider Kathlyn Sacramento MD       Encounter Date: 08/21/2021  Check In:  Session Check In - 08/21/21 0806       Check-In   Supervising physician immediately available to respond to emergencies See telemetry face sheet for immediately available ER MD    Location [facility]-Cardiac & Pulmonary Rehab    Staff Present Vida Rigger, RN, BSN;Joseph Hood, RCP,RRT,BSRT;Melissa [place], [place], Tawanna Solo, MS, ASCM CEP, Exercise Physiologist    Virtual Visit No    Medication changes reported     No    Fall or balance concerns reported    No    Warm-up and Cool-down Performed on first and last piece of equipment    Resistance Training Performed Yes    VAD Patient? No    PAD/SET Patient? No      Pain Assessment   Currently in Pain? No/denies                Social History   Tobacco Use  Smoking Status Never  Smokeless Tobacco Never    Goals Met:  Proper associated with RPD/PD & O2 Sat Independence with exercise equipment Exercise tolerated well No report of concerns or symptoms today Strength training completed today  Goals Unmet:  Not Applicable  Comments: Pt able to follow exercise prescription today without complaint.  Will continue to monitor for progression. Pt felt signs and symptoms of low BG, BG checked and normal 97, pt given applesauce, pt able to return to exercise with no other issues.   Dr. Emily Filbert is Medical Director for [place].  Dr. Ottie Glazier is Medical Director for [facility] Pulmonary Rehabilitation.

## 2021-08-26 DIAGNOSIS — E119 Type 2 diabetes mellitus without complications: Secondary | ICD-10-CM | POA: Diagnosis not present

## 2021-08-26 DIAGNOSIS — Z955 Presence of coronary angioplasty implant and graft: Secondary | ICD-10-CM

## 2021-08-26 DIAGNOSIS — Z48812 Encounter for surgical aftercare following surgery on the circulatory system: Secondary | ICD-10-CM | POA: Diagnosis not present

## 2021-08-26 NOTE — Progress Notes (Signed)
Daily Session Note  Patient Details  Name: Tiffany Alexander MRN: [id number] Date of Birth: 1946-01-01 Referring Provider:   Flowsheet Row Cardiac Rehab from 05/22/2021 in [facility] Cardiac and Pulmonary Rehab  Referring Provider Kathlyn Sacramento MD       Encounter Date: 08/26/2021  Check In:  Session Check In - 08/26/21 0749       Check-In   Supervising physician immediately available to respond to emergencies See telemetry face sheet for immediately available ER MD    Location [facility]-Cardiac & Pulmonary Rehab    Staff Present Earlean Shawl, BS, ACSM CEP, Exercise Physiologist;Melissa [place] Pillar, RDN, Luther Redo, MPA, RN    Virtual Visit No    Medication changes reported     No    Fall or balance concerns reported    No    Warm-up and Cool-down Performed on first and last piece of equipment    Resistance Training Performed Yes    VAD Patient? No    PAD/SET Patient? No      Pain Assessment   Currently in Pain? No/denies                Social History   Tobacco Use  Smoking Status Never  Smokeless Tobacco Never    Goals Met:  Independence with exercise equipment Exercise tolerated well No report of concerns or symptoms today Strength training completed today  Goals Unmet:  Not Applicable  Comments: Pt able to follow exercise prescription today without complaint.  Will continue to monitor for progression.    Dr. Emily Filbert is Medical Director for [place].  Dr. Ottie Glazier is Medical Director for [facility] Pulmonary Rehabilitation.

## 2021-08-28 DIAGNOSIS — Z955 Presence of coronary angioplasty implant and graft: Secondary | ICD-10-CM

## 2021-08-28 DIAGNOSIS — E119 Type 2 diabetes mellitus without complications: Secondary | ICD-10-CM | POA: Diagnosis not present

## 2021-08-28 DIAGNOSIS — Z48812 Encounter for surgical aftercare following surgery on the circulatory system: Secondary | ICD-10-CM | POA: Diagnosis not present

## 2021-08-28 NOTE — Progress Notes (Signed)
Daily Session Note  Patient Details  Name: Tiffany Alexander MRN: [id number] Date of Birth: [birth year] Referring Provider:   Flowsheet Row Cardiac Rehab from 05/22/2021 in [facility] Cardiac and Pulmonary Rehab  Referring Provider Kathlyn Sacramento MD       Encounter Date: 08/28/2021  Check In:  Session Check In - 08/28/21 0809       Check-In   Supervising physician immediately available to respond to emergencies See telemetry face sheet for immediately available ER MD    Location [facility]-Cardiac & Pulmonary Rehab    Staff Present Carson Myrtle, BS, RRT, CPFT;Joseph Karie Fetch, MPA, RN    Virtual Visit No    Medication changes reported     No    Fall or balance concerns reported    No    Warm-up and Cool-down Performed on first and last piece of equipment    Resistance Training Performed Yes    VAD Patient? No    PAD/SET Patient? No      Pain Assessment   Currently in Pain? No/denies                Social History   Tobacco Use  Smoking Status Never  Smokeless Tobacco Never    Goals Met:  Independence with exercise equipment Exercise tolerated well No report of concerns or symptoms today Strength training completed today  Goals Unmet:  Not Applicable  Comments: Pt able to follow exercise prescription today without complaint.  Will continue to monitor for progression.    Dr. Emily Filbert is Medical Director for [place].  Dr. Ottie Glazier is Medical Director for [facility] Pulmonary Rehabilitation.

## 2021-09-04 DIAGNOSIS — Z955 Presence of coronary angioplasty implant and graft: Secondary | ICD-10-CM

## 2021-09-04 DIAGNOSIS — Z48812 Encounter for surgical aftercare following surgery on the circulatory system: Secondary | ICD-10-CM | POA: Diagnosis not present

## 2021-09-04 DIAGNOSIS — E119 Type 2 diabetes mellitus without complications: Secondary | ICD-10-CM | POA: Diagnosis not present

## 2021-09-04 LAB — GLUCOSE, CAPILLARY: Glucose-Capillary: 93 mg/dL (ref 70–99)

## 2021-09-04 NOTE — Progress Notes (Signed)
Daily Session Note  Patient Details  Name: Tiffany Alexander MRN: [id number] Date of Birth: 05-23-1945 Referring Provider:   Flowsheet Row Cardiac Rehab from 05/22/2021 in [facility] Cardiac and Pulmonary Rehab  Referring Provider Kathlyn Sacramento MD       Encounter Date: 09/04/2021  Check In:  Session Check In - 09/04/21 0758       Check-In   Supervising physician immediately available to respond to emergencies See telemetry face sheet for immediately available ER MD    Location [facility]-Cardiac & Pulmonary Rehab    Staff Present Birdie Sons, MPA, RN;Melissa [place], RDN, LDN;Jessica [place], MA, RCEP, CCRP, CCET;Joseph [place], [place]    Virtual Visit No    Medication changes reported     No    Fall or balance concerns reported    No    Warm-up and Cool-down Performed on first and last piece of equipment    Resistance Training Performed Yes    VAD Patient? No    PAD/SET Patient? No      Pain Assessment   Currently in Pain? No/denies                Social History   Tobacco Use  Smoking Status Never  Smokeless Tobacco Never    Goals Met:  Independence with exercise equipment Exercise tolerated well No report of concerns or symptoms today Strength training completed today  Goals Unmet:  Not Applicable  Comments: Pt able to follow exercise prescription today without complaint.  Will continue to monitor for progression.    Dr. Emily Filbert is Medical Director for [place].  Dr. Ottie Glazier is Medical Director for [facility] Pulmonary Rehabilitation.

## 2021-09-08 ENCOUNTER — Ambulatory Visit
Admission: RE | Admit: 2021-09-08 | Discharge: 2021-09-08 | Disposition: A | Discharge status: Home / Self Care | Payer: Medicare HMO | Source: Ambulatory Visit | Attending: Family Medicine | Admitting: Family Medicine

## 2021-09-08 DIAGNOSIS — Z1231 Encounter for screening mammogram for malignant neoplasm of breast: Secondary | ICD-10-CM | POA: Insufficient documentation

## 2021-09-11 ENCOUNTER — Encounter: Payer: Medicare HMO | Attending: Cardiovascular Disease

## 2021-09-11 DIAGNOSIS — Z955 Presence of coronary angioplasty implant and graft: Secondary | ICD-10-CM | POA: Diagnosis not present

## 2021-09-11 DIAGNOSIS — Z48812 Encounter for surgical aftercare following surgery on the circulatory system: Secondary | ICD-10-CM | POA: Diagnosis not present

## 2021-09-11 NOTE — Progress Notes (Signed)
Daily Session Note  Patient Details  Name: Tiffany Alexander MRN: [id number] Date of Birth: February 02, 1946 Referring Provider:   Flowsheet Row Cardiac Rehab from 05/22/2021 in [facility] Cardiac and Pulmonary Rehab  Referring Provider Kathlyn Sacramento MD       Encounter Date: 09/11/2021  Check In:  Session Check In - 09/11/21 0807       Check-In   Supervising physician immediately available to respond to emergencies See telemetry face sheet for immediately available ER MD    Location [facility]-Cardiac & Pulmonary Rehab    Staff Present Antionette Fairy, BS, Exercise Physiologist;Joseph Dimmitt, RCP,RRT,BSRT;Melissa [place], RDN, Luther Redo, MPA, RN    Virtual Visit No    Medication changes reported     No    Fall or balance concerns reported    No    Tobacco Cessation No Change    Warm-up and Cool-down Performed on first and last piece of equipment    Resistance Training Performed Yes    VAD Patient? No    PAD/SET Patient? No      Pain Assessment   Currently in Pain? No/denies    Multiple Pain Sites No                Social History   Tobacco Use  Smoking Status Never  Smokeless Tobacco Never    Goals Met:  Independence with exercise equipment Exercise tolerated well No report of concerns or symptoms today Strength training completed today  Goals Unmet:  Not Applicable  Comments: Pt able to follow exercise prescription today without complaint.  Will continue to monitor for progression.    Dr. Emily Filbert is Medical Director for [place].  Dr. Ottie Glazier is Medical Director for [facility] Pulmonary Rehabilitation.

## 2021-09-16 DIAGNOSIS — Z48812 Encounter for surgical aftercare following surgery on the circulatory system: Secondary | ICD-10-CM | POA: Diagnosis not present

## 2021-09-16 DIAGNOSIS — Z955 Presence of coronary angioplasty implant and graft: Secondary | ICD-10-CM

## 2021-09-16 NOTE — Progress Notes (Signed)
Daily Session Note  Patient Details  Name: Tiffany Alexander MRN: [id number] Date of Birth: [birth year] Referring Provider:   Flowsheet Row Cardiac Rehab from 05/22/2021 in [facility] Cardiac and Pulmonary Rehab  Referring Provider Kathlyn Sacramento MD       Encounter Date: 09/16/2021  Check In:  Session Check In - 09/16/21 0751       Check-In   Supervising physician immediately available to respond to emergencies See telemetry face sheet for immediately available ER MD    Location [facility]-Cardiac & Pulmonary Rehab    Staff Present Birdie Sons, MPA, RN;Jessica [employer], MA, RCEP, CCRP, [place], BS, ACSM CEP, Exercise Physiologist    Virtual Visit No    Medication changes reported     No    Fall or balance concerns reported    No    Tobacco Cessation No Change    Warm-up and Cool-down Performed on first and last piece of equipment    Resistance Training Performed Yes    VAD Patient? No    PAD/SET Patient? No      Pain Assessment   Currently in Pain? No/denies                Social History   Tobacco Use  Smoking Status Never  Smokeless Tobacco Never    Goals Met:  Independence with exercise equipment Exercise tolerated well No report of concerns or symptoms today Strength training completed today  Goals Unmet:  Not Applicable  Comments: Pt able to follow exercise prescription today without complaint.  Will continue to monitor for progression.    Dr. Emily Filbert is Medical Director for [place].  Dr. Ottie Glazier is Medical Director for [facility] Pulmonary Rehabilitation.

## 2021-09-17 ENCOUNTER — Encounter: Payer: Self-pay | Admitting: *Deleted

## 2021-09-17 DIAGNOSIS — Z955 Presence of coronary angioplasty implant and graft: Secondary | ICD-10-CM

## 2021-09-17 NOTE — Progress Notes (Signed)
Cardiac Individual Treatment Plan  Patient Details  Name: Tiffany Alexander MRN: [id number] Date of Birth: 1945-09-29 Referring Provider:   Flowsheet Row Cardiac Rehab from 05/22/2021 in [facility] Cardiac and Pulmonary Rehab  Referring Provider Kathlyn Sacramento MD       Initial Encounter Date:  Flowsheet Row Cardiac Rehab from 05/22/2021 in [facility] Cardiac and Pulmonary Rehab  Date 05/22/21       Visit Diagnosis: Status post coronary artery stent placement  Patient's Home Medications on Admission:  Current Outpatient Medications:    albuterol (VENTOLIN HFA) 108 (90 Base) MCG/ACT inhaler, 2 puffs Q4H PRN, Disp: 18 g, Rfl: 0   amLODipine (NORVASC) 10 MG tablet, Take 10 mg by mouth at bedtime., Disp: , Rfl:    ascorbic acid (VITAMIN C) 500 MG tablet, Take 500 mg by mouth daily., Disp: , Rfl:    aspirin 81 MG chewable tablet, Chew 1 tablet (81 mg total) by mouth daily., Disp: 90 tablet, Rfl: 0   budesonide (PULMICORT) 0.25 MG/2ML nebulizer solution, Take 2 mLs (0.25 mg total) by nebulization 2 (two) times daily., Disp: 120 mL, Rfl: 6   calcium citrate-vitamin D (CITRACAL+D) 315-200 MG-UNIT tablet, Take 1 tablet by mouth 2 (two) times daily., Disp: , Rfl:    carvedilol (COREG) 25 MG tablet, Take 1 tablet (25 mg total) by mouth 2 (two) times daily., Disp: 180 tablet, Rfl: 0   cephALEXin (KEFLEX) 250 MG capsule, Take 250 mg by mouth daily. As needed for UTI, Disp: , Rfl:    Cholecalciferol (VITAMIN D3) 1000 units CAPS, Take by mouth daily., Disp: , Rfl:    citalopram (CELEXA) 20 MG tablet, Take by mouth., Disp: , Rfl:    clopidogrel (PLAVIX) 75 MG tablet, Take 1 tablet (75 mg total) by mouth daily with breakfast., Disp: 90 tablet, Rfl: 3   Cyanocobalamin (VITAMIN B 12 PO), Take 1 tablet by mouth daily., Disp: , Rfl:    diphenhydrAMINE (BENADRYL) 25 MG tablet, Take 75 mg by mouth daily as needed for allergies., Disp: , Rfl:    diphenoxylate-atropine (LOMOTIL) 2.5-0.025 MG tablet, Take 1 tablet by  mouth 2 (two) times daily as needed for diarrhea or loose stools., Disp: , Rfl:    fluticasone (FLONASE) 50 MCG/ACT nasal spray, Place 2 sprays into both nostrils 2 (two) times daily., Disp: , Rfl:    furosemide (LASIX) 20 MG tablet, Take 20 mg by mouth daily., Disp: , Rfl:    glipiZIDE (GLUCOTROL XL) 5 MG 24 hr tablet, Take 5 mg by mouth daily with breakfast., Disp: , Rfl:    hydrALAZINE (APRESOLINE) 50 MG tablet, Take 3 tablets (150 mg) by mouth TWICE daily (or every 12 hours) (Patient taking differently: Take 2 tablets in the am & 1 tablet in the pm), Disp: 180 tablet, Rfl: 6   hydrOXYzine (ATARAX/VISTARIL) 10 MG tablet, Take 10 mg by mouth every 8 (eight) hours as needed for itching., Disp: , Rfl:    Lansoprazole (PREVACID PO), Take by mouth., Disp: , Rfl:    losartan (COZAAR) 100 MG tablet, Take 100 mg by mouth at bedtime., Disp: , Rfl:    Melatonin 10 MG CAPS, Take 20 mg by mouth at bedtime., Disp: , Rfl:    Multiple Vitamins-Minerals (WOMENS MULTIVITAMIN PO), Take 1 tablet by mouth daily., Disp: , Rfl:    ondansetron (ZOFRAN-ODT) 4 MG disintegrating tablet, Take 1 tablet (4 mg total) by mouth every 8 (eight) hours as needed for nausea or vomiting., Disp: 20 tablet, Rfl: 0  PERFOROMIST 20 MCG/2ML nebulizer solution, USE 1 VIAL VIA NEBULIZER AND INHALE BY MOUTH 2 TIMES A DAY, Disp: 120 mL, Rfl: 11   Probiotic Product (PROBIOTIC BLEND PO), Take 1 capsule by mouth daily., Disp: , Rfl:    rosuvastatin (CRESTOR) 20 MG tablet, Take 1 tablet (20 mg total) by mouth at bedtime., Disp: 30 tablet, Rfl: 5   traMADol (ULTRAM) 50 MG tablet, Take 50 mg by mouth 3 (three) times daily as needed for moderate pain., Disp: , Rfl:    traZODone (DESYREL) 150 MG tablet, Take 150 mg by mouth at bedtime., Disp: , Rfl:  No current facility-administered medications for this visit.  Facility-Administered Medications Ordered in Other Visits:    albuterol (PROVENTIL) (2.5 MG/3ML) 0.083% nebulizer solution 2.5 mg, 2.5  mg, Nebulization, Once, Wilhelmina Mcardle, MD  Past Medical History: Past Medical History:  Diagnosis Date   Arthritis    osteoarthritis   Carpal tunnel syndrome of left wrist    Depression    Diabetes mellitus without complication (HCC)    Diverticulosis    GERD (gastroesophageal reflux disease)    Hypertension    PONV (postoperative nausea and vomiting)    PONV (postoperative nausea and vomiting)    Pulmonary fibrosis (HCC)    Recurrent UTI    SUI (stress urinary incontinence, female)    Wears dentures    full upper and lower    Tobacco Use: Social History   Tobacco Use  Smoking Status Never  Smokeless Tobacco Never    Labs: Review Flowsheet        No data to display           Exercise Target Goals: Exercise Program Goal: Individual exercise prescription set using results from initial 6 min walk test and THRR while considering  patient's activity barriers and safety.   Exercise Prescription Goal: Initial exercise prescription builds to 30-45 minutes a day of aerobic activity, 2-3 days per week.  Home exercise guidelines will be given to patient during program as part of exercise prescription that the participant will acknowledge.   Education: Aerobic Exercise: - Group verbal and visual presentation on the components of exercise prescription. Introduces F.I.T.T principle from ACSM for exercise prescriptions.  Reviews F.I.T.T. principles of aerobic exercise including progression. Written material given at graduation. Flowsheet Row Cardiac Rehab from 05/22/2021 in [facility] Cardiac and Pulmonary Rehab  Education need identified 05/22/21       Education: Resistance Exercise: - Group verbal and visual presentation on the components of exercise prescription. Introduces F.I.T.T principle from ACSM for exercise prescriptions  Reviews F.I.T.T. principles of resistance exercise including progression. Written material given at graduation.    Education: Exercise &  Equipment Safety: - Individual verbal instruction and demonstration of equipment use and safety with use of the equipment. Flowsheet Row Cardiac Rehab from 05/22/2021 in [facility] Cardiac and Pulmonary Rehab  Education need identified 05/22/21  Date 05/22/21  Educator [place]  Instruction Review Code 1- Verbalizes Understanding       Education: Exercise Physiology & General Exercise Guidelines: - Group verbal and written instruction with models to review the exercise physiology of the cardiovascular system and associated critical values. Provides general exercise guidelines with specific guidelines to those with heart or lung disease.  Flowsheet Row Cardiac Rehab from 05/22/2021 in [facility] Cardiac and Pulmonary Rehab  Education need identified 05/22/21       Education: Flexibility, Balance, Mind/Body Relaxation: - Group verbal and visual presentation with interactive activity on the components of exercise  prescription. Introduces F.I.T.T principle from ACSM for exercise prescriptions. Reviews F.I.T.T. principles of flexibility and balance exercise training including progression. Also discusses the mind body connection.  Reviews various relaxation techniques to help reduce and manage stress (i.e. Deep breathing, progressive muscle relaxation, and visualization). Balance handout provided to take home. Written material given at graduation.   Activity Barriers & Risk Stratification:  Activity Barriers & Cardiac Risk Stratification - 05/22/21 0939       Activity Barriers & Cardiac Risk Stratification   Activity Barriers Balance Concerns;Arthritis;Joint Problems;Back Problems;Deconditioning;Muscular [employer] Device;Shortness of Breath    Cardiac Risk Stratification Moderate             6 Minute Walk:  6 Minute Walk     Row Name 05/22/21 0940         6 Minute Walk   Phase Initial     Distance 435 feet     Walk Time 5.25 minutes  Break 3:45-4:25     # of Rest Breaks 1     MPH 0.94      METS 0.46     RPE 13     Perceived Dyspnea  2     VO2 Peak 1.63     Symptoms Yes (comment)     Comments SOB     Resting HR 62 bpm     Resting BP 154/74     Resting Oxygen Saturation  96 %     Exercise Oxygen Saturation  during 6 min walk 92 %     Max Ex. HR 74 bpm     Max Ex. BP 180/78     2 Minute Post BP 156/74              Oxygen Initial Assessment:   Oxygen Re-Evaluation:   Oxygen Discharge (Final Oxygen Re-Evaluation):   Initial Exercise Prescription:  Initial Exercise Prescription - 05/22/21 1400       Date of Initial Exercise RX and Referring Provider   Date 05/22/21    Referring Provider Kathlyn Sacramento MD      Oxygen   Maintain Oxygen Saturation 88% or higher      Recumbant Bike   Level 1    RPM 60    Minutes 15    METs 1      NuStep   Level 1    SPM 80    Minutes 15    METs 1      Biostep-RELP   Level 1    SPM 50    Minutes 15    METs 1      Track   Laps 5   as tolerated   Minutes 15    METs 1.27      Prescription Details   Frequency (times per week) 2    Duration Progress to 30 minutes of continuous aerobic without signs/symptoms of physical distress      Intensity   THRR 40-80% of Max Heartrate 94 - 126    Ratings of Perceived Exertion 11-13    Perceived Dyspnea 0-4      Progression   Progression Continue to progress workloads to maintain intensity without signs/symptoms of physical distress.      Resistance Training   Training Prescription Yes    Weight 2 lb    Reps 10-15             Perform Capillary Blood Glucose checks as needed.  Exercise Prescription Changes:   Exercise Prescription Changes     Row  Name 05/22/21 1400 06/02/21 1100 06/12/21 0800 07/01/21 1600 07/28/21 1600     Response to Exercise   Blood Pressure (Admit) 154/74 110/58 -- 138/68 122/82   Blood Pressure (Exercise) 180/78 132/70 -- 142/64 --   Blood Pressure (Exit) 156/74 124/62 -- 120/62 124/70   Heart Rate (Admit) 62 bpm 66 bpm --  55 bpm 60 bpm   Heart Rate (Exercise) 74 bpm 75 bpm -- 86 bpm 73 bpm   Heart Rate (Exit) 66 bpm 65 bpm -- 72 bpm 56 bpm   Oxygen Saturation (Admit) 96 % -- -- -- --   Oxygen Saturation (Exercise) 92 % -- -- -- --   Oxygen Saturation (Exit) 95 % -- -- -- --   Rating of Perceived Exertion (Exercise) 13 14 -- 12 12   Perceived Dyspnea (Exercise) 2 -- -- -- --   Symptoms SOB none -- fatigue fatigue   Comments walk test results 1st full day of exercise -- -- --   Duration -- Progress to 30 minutes of  aerobic without signs/symptoms of physical distress Progress to 30 minutes of  aerobic without signs/symptoms of physical distress Progress to 30 minutes of  aerobic without signs/symptoms of physical distress Progress to 30 minutes of  aerobic without signs/symptoms of physical distress   Intensity -- THRR unchanged THRR unchanged THRR unchanged THRR unchanged     Progression   Progression -- Continue to progress workloads to maintain intensity without signs/symptoms of physical distress. Continue to progress workloads to maintain intensity without signs/symptoms of physical distress. Continue to progress workloads to maintain intensity without signs/symptoms of physical distress. Continue to progress workloads to maintain intensity without signs/symptoms of physical distress.   Average METs -- 1.1 1.1 1.95 1.88     Resistance Training   Training Prescription -- Yes Yes Yes Yes   Weight -- 2 lb 2 lb 2 lb 2 lb   Reps -- 10-15 10-15 10-15 10-15     Interval Training   Interval Training -- No No No No     Recumbant Bike   Level -- -- -- 1 1   Watts -- -- -- -- 8   Minutes -- -- -- 15 15   METs -- -- -- 3.1 3     NuStep   Level -- _0 Minutes -- _1 METs -- 1.1 1.1 1.6 2     Biostep-RELP   Level -- 1 1 -- --   Minutes -- 15 15 -- --   METs -- 1 1 -- --     Track   Laps -- -- -- -- 3   Minutes -- -- -- -- 15   METs -- -- -- -- 1.3     Home Exercise Plan   Plans to  continue exercise at -- -- Home (comment)  at 1-2 days a week of home exercies (walking, stretching, stregthening) at home when not in rehab. Home (comment)  at 1-2 days a week of home exercies (walking, stretching, stregthening) at home when not in rehab. Home (comment)  at 1-2 days a week of home exercies (walking, stretching, stregthening) at home when not in rehab.   Frequency -- -- Add 2 additional days to program exercise sessions. Add 2 additional days to program exercise sessions. Add 2 additional days to program exercise sessions.   Initial Home Exercises Provided -- -- 06/12/21 06/12/21 06/12/21     Oxygen   Maintain Oxygen Saturation --  88% or higher 88% or higher 88% or higher 88% or higher    Row Name 08/11/21 0900 08/25/21 1400 09/08/21 0800         Response to Exercise   Blood Pressure (Admit) 122/64 128/76 126/74     Blood Pressure (Exit) 120/70 118/62 142/70     Heart Rate (Admit) 60 bpm 62 bpm 65 bpm     Heart Rate (Exercise) 85 bpm 69 bpm 77 bpm     Heart Rate (Exit) 61 bpm 58 bpm 74 bpm     Rating of Perceived Exertion (Exercise) _0 Symptoms none none none     Duration Continue with 30 min of aerobic exercise without signs/symptoms of physical distress. Continue with 30 min of aerobic exercise without signs/symptoms of physical distress. Continue with 30 min of aerobic exercise without signs/symptoms of physical distress.     Intensity THRR unchanged THRR unchanged THRR unchanged       Progression   Progression Continue to progress workloads to maintain intensity without signs/symptoms of physical distress. Continue to progress workloads to maintain intensity without signs/symptoms of physical distress. Continue to progress workloads to maintain intensity without signs/symptoms of physical distress.     Average METs 1.78 1.72 2.19       Resistance Training   Training Prescription Yes Yes Yes     Weight 2 lb 2 lb 2 lb     Reps 10-15 10-15 10-15        Interval Training   Interval Training No No No       Recumbant Bike   Level _1 Watts _2 Minutes _3 METs 2.31 2.31 2.31       NuStep   Level _4 Minutes _5 METs 15 1.7 2       Biostep-RELP   Level -- 1 --     Minutes -- 15 --     METs -- 1 --       Track   Laps -- 5 --     Minutes -- 15 --     METs -- 1.27 --       Home Exercise Plan   Plans to continue exercise at Home (comment)  at 1-2 days a week of home exercies (walking, stretching, stregthening) at home when not in rehab. Home (comment)  at 1-2 days a week of home exercies (walking, stretching, stregthening) at home when not in rehab. Home (comment)  at 1-2 days a week of home exercies (walking, stretching, stregthening) at home when not in rehab.     Frequency Add 2 additional days to program exercise sessions. Add 2 additional days to program exercise sessions. Add 2 additional days to program exercise sessions.     Initial Home Exercises Provided 06/12/21 06/12/21 06/12/21       Oxygen   Maintain Oxygen Saturation 88% or higher 88% or higher 88% or higher              Exercise Comments:   Exercise Comments     Row Name 05/27/21 0756           Exercise Comments First full day of exercise!  Patient was oriented to gym and equipment including functions, settings, policies, and procedures.  Patient's individual exercise prescription and treatment plan were reviewed.  All  starting workloads were established based on the results of the 6 minute walk test done at initial orientation visit.  The plan for exercise progression was also introduced and progression will be customized based on patient's performance and goals.                Exercise Goals and Review:   Exercise Goals     Row Name 05/22/21 1422             Exercise Goals   Increase Physical Activity Yes       Intervention Provide advice, education, support and counseling about physical  activity/exercise needs.;Develop an individualized exercise prescription for aerobic and resistive training based on initial evaluation findings, risk stratification, comorbidities and participant's personal goals.       Expected Outcomes Short Term: Attend rehab on a regular basis to increase amount of physical activity.;Long Term: Add in home exercise to make exercise part of routine and to increase amount of physical activity.;Long Term: Exercising regularly at least 3-5 days a week.       Increase Strength and Stamina Yes       Intervention Provide advice, education, support and counseling about physical activity/exercise needs.;Develop an individualized exercise prescription for aerobic and resistive training based on initial evaluation findings, risk stratification, comorbidities and participant's personal goals.       Expected Outcomes Short Term: Increase workloads from initial exercise prescription for resistance, speed, and METs.;Short Term: Perform resistance training exercises routinely during rehab and add in resistance training at home;Long Term: Improve cardiorespiratory fitness, muscular endurance and strength as measured by increased METs and functional capacity (6MWT)       Able to understand and use rate of perceived exertion (RPE) scale Yes       Intervention Provide education and explanation on how to use RPE scale       Expected Outcomes Short Term: Able to use RPE daily in rehab to express subjective intensity level;Long Term:  Able to use RPE to guide intensity level when exercising independently       Able to understand and use Dyspnea scale Yes       Intervention Provide education and explanation on how to use Dyspnea scale       Expected Outcomes Short Term: Able to use Dyspnea scale daily in rehab to express subjective sense of shortness of breath during exertion;Long Term: Able to use Dyspnea scale to guide intensity level when exercising independently       Knowledge and  understanding of Target Heart Rate Range (THRR) Yes       Intervention Provide education and explanation of THRR including how the numbers were predicted and where they are located for reference       Expected Outcomes Short Term: Able to state/look up THRR;Long Term: Able to use THRR to govern intensity when exercising independently;Short Term: Able to use daily as guideline for intensity in rehab       Able to check pulse independently Yes       Intervention Provide education and demonstration on how to check pulse in carotid and radial arteries.;Review the importance of being able to check your own pulse for safety during independent exercise       Expected Outcomes Short Term: Able to explain why pulse checking is important during independent exercise;Long Term: Able to check pulse independently and accurately       Understanding of Exercise Prescription Yes       Intervention Provide education, explanation,  and written materials on patient's individual exercise prescription       Expected Outcomes Short Term: Able to explain program exercise prescription;Long Term: Able to explain home exercise prescription to exercise independently                Exercise Goals Re-Evaluation :  Exercise Goals Re-Evaluation     Row Name 05/27/21 0756 06/02/21 1117 06/12/21 0824 07/01/21 1612 07/10/21 0811     Exercise Goal Re-Evaluation   Exercise Goals Review Increase Physical Activity;Able to understand and use rate of perceived exertion (RPE) scale;Knowledge and understanding of Target Heart Rate Range (THRR);Understanding of Exercise Prescription;Able to understand and use Dyspnea scale;Able to check pulse independently;Increase Strength and Stamina Increase Physical Activity;Increase Strength and Stamina Increase Physical Activity;Increase Strength and Stamina;Able to understand and use rate of perceived exertion (RPE) scale;Able to understand and use Dyspnea scale;Knowledge and understanding of  Target Heart Rate Range (THRR);Able to check pulse independently;Understanding of Exercise Prescription Increase Physical Activity;Increase Strength and Stamina;Understanding of Exercise Prescription Increase Physical Activity;Increase Strength and Stamina;Understanding of Exercise Prescription   Comments Reviewed RPE and dyspnea scales, THR and program prescription with pt today.  Pt voiced understanding and was given a copy of goals to take home. Andee completed her first session of rehab and tolerated it well. RPEs were in good range. We will continue to monitor as she progresses throughout the program. Reviewed home exercise with pt today.  Pt plans to walk for exercise.  Reviewed THR, pulse, RPE, sign and symptoms, pulse oximetery and when to call 911 or MD.  Also discussed weather considerations and indoor options.  Pt voiced understanding. Keeli returned today after being out for two weeks.  She was able to return to her current workloads without a problem.  We will continue to monitor her progress. Daisie reports doing no exercise at home right now, according to home exercise note, Elleni reported she will walk for exercise - she does not remember this. She reports outside is too bumpy to walk, but she can walk inside. She reports not being able to go to a gym for exercise while at rehab because her family doesn't like her driving. She reports not having access to youtube for our exercise videos, but she does still have the exerciuse packet. She reports that her arms are weak and she got a cortisone shot which did not help - discussed how this can alleviate pain, but wont build muscle; discussed importance of resistance exercise.   Expected Outcomes Short: Use RPE daily to regulate intensity. Long: Follow program prescription in THR. Short: Continue to exercise at current exercise prescription Long: Increase overall MET level Short: add 1-2 days of exercise at home on off days of rehab. Long: become independent  with exercise program. Short: Return to regular attendance Long; Conintue to improve stamina. ST: Return to regular attendance, exercidse outside of rehab  LT; Conintue to improve stamina.    [place] Name 07/28/21 1558 07/31/21 0827 08/11/21 0932 08/25/21 1423 09/04/21 0815     Exercise Goal Re-Evaluation   Exercise Goals Review Increase Physical Activity;Increase Strength and Stamina;Understanding of Exercise Prescription Increase Physical Activity;Increase Strength and Stamina;Understanding of Exercise Prescription Increase Physical Activity;Increase Strength and Stamina;Understanding of Exercise Prescription Increase Physical Activity;Increase Strength and Stamina;Understanding of Exercise Prescription Increase Physical Activity;Increase Strength and Stamina;Understanding of Exercise Prescription   Comments Samona continues to have intermitten attendance due to transportation issues with her husband and his appointments.  We have encouraged her to try to  attend reguarly to get the most out of the program, but she has been averaging about once a week.  She is able to walk some with her wheelchair around the track and has started to walk down to rehab pushing the chair.  We will continue to montior her progress. Janalee is not exercising at home right now - discussed walking at home as outside is too bumpy and ustilizing the exercise packet given by the EP during home exercise. We discussed utilizing household itema such as water bottles and canned items that can fit in her hand comfortably. EP spoke with Ashonti about purchasing well made, cheap weights or resistance bands at [employer], [place], [place], [place] told her she could give her some resistance band handouts and show her how to use them. Ambriella has been coming when she is able to with transportation. She does not walk at rehab but has started to push her wheelchair down to rehab which is good for her as she was not able to do that before. She continues to  work at level 1 on the recumbant bike and hope to see that increase to level 2. We will continue to monitor. Tracy continues to do well in rehab. She was able to walk 5 laps on the track with a couple breaks in between. She also increased to level 3 on the T4 Nustep. She should attempt to increase to level 2 on the recumbant bike. Will continue to monitor. Raghad is is still walking at home, but not outside. She has a walker at home. Encouraged her to ustilize the exercise packet given by the EP during home exercise as well as buying some cheap weights for resistance exercise at home.   Expected Outcomes Short: Aim for regular attendance in rehab Long; Conitnue to walk more Short: Aim for regular attendance in rehab, purchase resistance bands and/or weights Long: Conitnue to walk more Short: Continue to push wheelchair down to incorporate walking Long: Increase overall MET level Short: Increase level on RB Long: Continue to build up overall strength and stamina Short: begin structured exercise outside of rehab  Long: Continue to build up overall strength and stamina    Row Name 09/08/21 0807             Exercise Goal Re-Evaluation   Exercise Goals Review Increase Physical Activity;Increase Strength and Stamina;Understanding of Exercise Prescription       Comments Leylany is doing well in rehab. She improved her average overall METs to 2.19 METs. She has also tolerated 2 lb hand weights and may benefit from trying 3 lb for resistance training. Susie has tolerated level 1 on the recumbent bike as well and could benefit from trying level 2. We will continue to monitor her progress in the program.       Expected Outcomes Short: try level 2 on the recumbent bike. Long: Continue to improve overall MET level.                Discharge Exercise Prescription (Final Exercise Prescription Changes):  Exercise Prescription Changes - 09/08/21 0800       Response to Exercise   Blood Pressure (Admit) 126/74     Blood Pressure (Exit) 142/70    Heart Rate (Admit) 65 bpm    Heart Rate (Exercise) 77 bpm    Heart Rate (Exit) 74 bpm    Rating of Perceived Exertion (Exercise) 13    Symptoms none    Duration Continue with 30 min of  aerobic exercise without signs/symptoms of physical distress.    Intensity THRR unchanged      Progression   Progression Continue to progress workloads to maintain intensity without signs/symptoms of physical distress.    Average METs 2.19      Resistance Training   Training Prescription Yes    Weight 2 lb    Reps 10-15      Interval Training   Interval Training No      Recumbant Bike   Level 1    Watts 9    Minutes 30    METs 2.31      NuStep   Level 2    Minutes 30    METs 2      Home Exercise Plan   Plans to continue exercise at Home (comment)   at 1-2 days a week of home exercies (walking, stretching, stregthening) at home when not in rehab.   Frequency Add 2 additional days to program exercise sessions.    Initial Home Exercises Provided 06/12/21      Oxygen   Maintain Oxygen Saturation 88% or higher             Nutrition:  Target Goals: Understanding of nutrition guidelines, daily intake of sodium '1500mg'$ , cholesterol '200mg'$ , calories 30% from fat and 7% or less from saturated fats, daily to have 5 or more servings of fruits and vegetables.  Education: All About Nutrition: -Group instruction provided by verbal, written material, interactive activities, discussions, models, and posters to present general guidelines for heart healthy nutrition including fat, fiber, MyPlate, the role of sodium in heart healthy nutrition, utilization of the nutrition label, and utilization of this knowledge for meal planning. Follow up email sent as well. Written material given at graduation. Flowsheet Row Cardiac Rehab from 05/22/2021 in [facility] Cardiac and Pulmonary Rehab  Education need identified 05/22/21       Biometrics:  Pre Biometrics - 05/22/21 0939        Pre Biometrics   Height 5' 2.5" (1.588 m)    Weight 204 lb 11.2 oz (92.9 kg)    BMI (Calculated) 36.82    Single Leg Stand 0 seconds              Nutrition Therapy Plan and Nutrition Goals:  Nutrition Therapy & Goals - 07/07/21 1427       Nutrition Therapy   Diet Heart healthy, low Na, T2DM    Protein (specify units) 75g    Fiber 25 grams    Whole Grain Foods 3 servings    Saturated Fats 12 max. grams    Fruits and Vegetables 8 servings/day    Sodium 2 grams      Personal Nutrition Goals   Nutrition Goal ST: add at least 1 CHO serving to Breakfast and Lunch to avoid BG lows LT: Maintain A1C <7, follow MyPlate guidelines, eat consistent CHOs    Comments 76 y.o. F admitted to cardiac rehab s/p coronary artery placement. PMHx of asthma, HTN, T2DM, pulmonary fibrosis, coronary calcification, GERD, diverticulosis. Pt wears dentures. Relevant medications include vit C, Calcium w/ vit D, vit D3, vitamin B12, lasix, glipizide, lomotil, lansoprazole, MVI, zofran, probiotic, crestor, tramadol, trazodone. PYP Score: 58. Vegetables & Fruits 5/12. Breads, Grains & Cereals 7/12. Red & Processed Meat 4/12. Poultry 2/2. Fish & Shellfish 0/4. Beans, Nuts & Seeds 1/4. Milk & Dairy Foods 2/6. Toppings, Oils, Seasonings & Salt 13/20. Sweets, Snacks & Restaurant Food 14/14. Beverages 10/10. Coffee B: biscuit - not as much  anymore, 2 eggs and toast (keto bread: 2 slices = 38S CHO, 6g fiber) L: will sometimes skip, sandwich (peanut butter or [place] - keto bread) or leftovers S: she will have low sugar around here D:3-4pm: meat with 2 vegetables. Discussed general heart healthy eating as well as T2DM MNT.      Intervention Plan   Intervention Prescribe, educate and counsel regarding individualized specific dietary modifications aiming towards targeted core components such as weight, hypertension, lipid management, diabetes, heart failure and other comorbidities.    Expected Outcomes Short Term Goal:  Understand basic principles of dietary content, such as calories, fat, sodium, cholesterol and nutrients.;Short Term Goal: A plan has been developed with personal nutrition goals set during dietitian appointment.;Long Term Goal: Adherence to prescribed nutrition plan.             Nutrition Assessments:  MEDIFICTS Score Key: ?70 Need to make dietary changes  40-70 Heart Healthy Diet ? 40 Therapeutic Level Cholesterol Diet  Flowsheet Row Cardiac Rehab from 05/22/2021 in [facility] Cardiac and Pulmonary Rehab  Picture Your Plate Total Score on Admission 58      Picture Your Plate Scores: <50 Unhealthy dietary pattern with much room for improvement. 41-50 Dietary pattern unlikely to meet recommendations for good health and room for improvement. 51-60 More healthful dietary pattern, with some room for improvement.  >60 Healthy dietary pattern, although there may be some specific behaviors that could be improved.    Nutrition Goals Re-Evaluation:  Nutrition Goals Re-Evaluation     Row Name 06/12/21 0759 07/31/21 0814 09/04/21 0801         Goals   Nutrition Goal -- ST: Continue to make sure meals and snacks have protein, fiber, and fat. Make sure meals have at least 2-3 CHO servings and snacks 1-2. LT: Maintain A1C <7, follow MyPlate guidelines, eat consistent CHOs ST: Practice MyPlate structure. LT: Maintain A1C <7, follow MyPlate guidelines, eat consistent CHOs     Comment Patient has not yet met with program dietician to set nutrition goals. She has a scheduled appointment on June 26, 2021. She reports liking cooking and eating. She has optifast bar as one snack - 4g fiber, 17g CHO, protein 16g. She got a new whole grain bread and eats more vegetables; she is practicing MyPlate structure. She would like to continue with her current changes so far. We discussed some other healthy snacks such as fruit with nuts/seeds - she enjoys fruit, but it runs her BG up; discussed pairing CHO rich foods  with protein and fat. She is baking chicken with vinegar - her grandchildren calls it vinegar chicken. Crescentia reports that her BG has been high because she went to [facility] and she reports not eating like she was supposed to like having coconut cake and then brownies when she got home because her grandson made them. She is enjoying her whole grain bread thats he bought and is continuing to eat more vegetables. She continues to practice MyPlate structure. Discussed practicing MyPlate structure even when she doesn't cook it herself like at church and at the 4th of July BBQ coming up.     Expected Outcome Short: Meet with program dietician to set nutrition goals. Long: Work towards diet changes given by Tourist information centre manager. ST: Continue to make sure meals and snacks have protein, fiber, and fat. Make sure meals have at least 2-3 CHO servings and snacks 1-2. LT: Maintain A1C <7, follow MyPlate guidelines, eat consistent CHOs ST: Practice MyPlate structure. LT: Maintain A1C <  7, follow MyPlate guidelines, eat consistent CHOs              Nutrition Goals Discharge (Final Nutrition Goals Re-Evaluation):  Nutrition Goals Re-Evaluation - 09/04/21 0801       Goals   Nutrition Goal ST: Practice MyPlate structure. LT: Maintain A1C <7, follow MyPlate guidelines, eat consistent CHOs    Comment Landen reports that her BG has been high because she went to [place] and she reports not eating like she was supposed to like having coconut cake and then brownies when she got home because her grandson made them. She is enjoying her whole grain bread thats he bought and is continuing to eat more vegetables. She continues to practice MyPlate structure. Discussed practicing MyPlate structure even when she doesn't cook it herself like at church and at the 4th of July BBQ coming up.    Expected Outcome ST: Practice MyPlate structure. LT: Maintain A1C <7, follow MyPlate guidelines, eat consistent CHOs              Psychosocial: Target Goals: Acknowledge presence or absence of significant depression and/or stress, maximize coping skills, provide positive support system. Participant is able to verbalize types and ability to use techniques and skills needed for reducing stress and depression.   Education: Stress, Anxiety, and Depression - Group verbal and visual presentation to define topics covered.  Reviews how body is impacted by stress, anxiety, and depression.  Also discusses healthy ways to reduce stress and to treat/manage anxiety and depression.  Written material given at graduation.   Education: Sleep Hygiene -Provides group verbal and written instruction about how sleep can affect your health.  Define sleep hygiene, discuss sleep cycles and impact of sleep habits. Review good sleep hygiene tips.    Initial Review & Psychosocial Screening:  Initial Psych Review & Screening - 05/14/21 1411       Initial Review   Current issues with Current Stress Concerns      Family Dynamics   Good Support System? Yes   daughter     Barriers   Psychosocial barriers to participate in program There are no identifiable barriers or psychosocial needs.;The patient should benefit from training in stress management and relaxation.      Screening Interventions   Interventions Encouraged to exercise;Provide feedback about the scores to participant;To provide support and resources with identified psychosocial needs    Expected Outcomes Short Term goal: Utilizing psychosocial counselor, staff and physician to assist with identification of specific Stressors or current issues interfering with healing process. Setting desired goal for each stressor or current issue identified.;Long Term Goal: Stressors or current issues are controlled or eliminated.;Short Term goal: Identification and review with participant of any Quality of Life or Depression concerns found by scoring the questionnaire.;Long Term goal: The  participant improves quality of Life and PHQ9 Scores as seen by post scores and/or verbalization of changes             Quality of Life Scores:   Quality of Life - 05/22/21 0936       Quality of Life   Select Quality of Life      Quality of Life Scores   Health/Function Pre 21 %    Socioeconomic Pre 24.3 %    Psych/Spiritual Pre 26.57 %    Family Pre 26.4 %    GLOBAL Pre 23.66 %            Scores of 19 and below usually indicate a poorer quality  of life in these areas.  A difference of  2-3 points is a clinically meaningful difference.  A difference of 2-3 points in the total score of the Quality of Life Index has been associated with significant improvement in overall quality of life, self-image, physical symptoms, and general health in studies assessing change in quality of life.  PHQ-9: Review Flowsheet       07/15/2021 05/22/2021  Depression screen PHQ 2/9  Decreased Interest 0 0  Down, Depressed, Hopeless 0 2  PHQ - 2 Score 0 2  Altered sleeping 0 0  Tired, decreased energy 1 0  Change in appetite 0 3  Feeling bad or failure about yourself  0 1  Trouble concentrating 0 0  Moving slowly or fidgety/restless 0 0  Suicidal thoughts 0 0  PHQ-9 Score 1 6  Difficult doing work/chores Not difficult at all Somewhat difficult   Interpretation of Total Score  Total Score Depression Severity:  1-4 = Minimal depression, 5-9 = Mild depression, 10-14 = Moderate depression, 15-19 = Moderately severe depression, 20-27 = Severe depression   Psychosocial Evaluation and Intervention:  Psychosocial Evaluation - 05/14/21 1421       Psychosocial Evaluation & Interventions   Interventions Encouraged to exercise with the program and follow exercise prescription;Stress management education    Comments Rainn originally was not going to participate in cardiac rehab but changed her mind because the copay was doable and she wants to feel better. She lives at home with her husband and  their daughter helps her out. When asked about stress, she stated there was none she could talk about at that moment (due to her husband being in the room with her). She mentioned she hasn't had to reach out to anyone to talk about this stress, but staff told her the opportunity would present itself while she is here in the program if she wants to discuss what is going on. She said she felt safe in whatever the situation is. Her breathing difficulties have improved since after the stent. She still struggles with balance so she takes things slow so she doesn't fall. When asked about using a cane or a walker she mentioned her husband didn't want her to be dependent on one. She is extra careful when out of her normal setting so she doesn't get tripped up. Her daughter accompanies her to most of her apppointments. Waunita is hoping this program will help boost her stamina.    Expected Outcomes Short; attend cardiac rehab for education and exercise. Long: develop and maintain positive self care habits.    Continue Psychosocial Services  Follow up required by staff             Psychosocial Re-Evaluation:  Psychosocial Re-Evaluation     [place] Name 06/12/21 0801 06/12/21 0809 07/10/21 0817 07/31/21 5009 09/04/21 3818     Psychosocial Re-Evaluation   Current issues with _0    Comments -- Patient reports no new stress or sleep concerns. She reports that some of her chruch friends are terminally and chronically ill and that makes her sad. She will take a nap to help relieve stress. Discussed some stress reducing activities as well as mindful meditation, she reports chores and taking care of her son keeps her busy so she does not have time for anything like that. She relies on her daughter and husband for support. She reports sleeping well. She reports that some  of her chruch friends are terminally and  chronically ill and that makes her sad, she reports that she has given that to the lord and she will make them food like giving them chicken salad. She would like to make a care package to her granddaughter when she goes to college. She reports her marriage is hard - this is her third marriage. Her son is also autistic and she reports this can be difficult at times. She will take a nap and read her bible to help relieve stress. She will also participate in her church as well to help relieve stress. She relies on her daughter for support - her daughter calls every day. She reports sleeping well. She reports that some of her church friends are terminally and chronically ill and that makes her sad, She reports her husband can be difficult to deal with. Her son is also autistic and she reports this can be difficult at times. She continues to take a nap, read her bible, and participate in her church as well to help relieve stress. She relies on her daughter for support - her daughter calls every day. She has been to [employer] this last week and visted her friend - she reports hvaing a good time even if she wasn't eating as well. She reports sleeping well.   Expected Outcomes -- Short: continue to attend cardiac rehab consistently for physical and mental health benefits. Long: Maintain good mental health habits and sleep patterns. ST: continue to attend cardiac rehab consistently for physical and mental health benefits. LT: Maintain good mental health habits and sleep patterns. ST: continue to attend cardiac rehab consistently for physical and mental health benefits. LT: Maintain good mental health habits and sleep patterns. ST: continue to attend cardiac rehab consistently for physical and mental health benefits. LT: Maintain good mental health habits and sleep patterns.   Continue Psychosocial Services  -- -- Follow up required by staff Follow up required by staff Follow up required by staff             Psychosocial Discharge (Final Psychosocial Re-Evaluation):  Psychosocial Re-Evaluation - 09/04/21 0812       Psychosocial Re-Evaluation   Current issues with Current Stress Concerns    Comments She reports that some of her church friends are terminally and chronically ill and that makes her sad, She reports her husband can be difficult to deal with. Her son is also autistic and she reports this can be difficult at times. She continues to take a nap, read her bible, and participate in her church as well to help relieve stress. She relies on her daughter for support - her daughter calls every day. She has been to [employer] this last week and visted her friend - she reports hvaing a good time even if she wasn't eating as well. She reports sleeping well.    Expected Outcomes ST: continue to attend cardiac rehab consistently for physical and mental health benefits. LT: Maintain good mental health habits and sleep patterns.    Continue Psychosocial Services  Follow up required by staff             Vocational Rehabilitation: Provide vocational rehab assistance to qualifying candidates.   Vocational Rehab Evaluation & Intervention:  Vocational Rehab - 05/14/21 1411       Initial Vocational Rehab Evaluation & Intervention   Assessment shows need for Vocational Rehabilitation No             Education: Education Goals: Education  classes will be provided on a variety of topics geared toward better understanding of heart health and risk factor modification. Participant will state understanding/return demonstration of topics presented as noted by education test scores.  Learning Barriers/Preferences:  Learning Barriers/Preferences - 05/14/21 1411       Learning Barriers/Preferences   Learning Barriers None    Learning Preferences Individual Instruction             General Cardiac Education Topics:  AED/CPR: - Group verbal and written instruction with the use of models to  demonstrate the basic use of the AED with the basic ABC's of resuscitation.   Anatomy and Cardiac Procedures: - Group verbal and visual presentation and models provide information about basic cardiac anatomy and function. Reviews the testing methods done to diagnose heart disease and the outcomes of the test results. Describes the treatment choices: Medical Management, Angioplasty, or Coronary Bypass Surgery for treating various heart conditions including Myocardial Infarction, Angina, Valve Disease, and Cardiac Arrhythmias.  Written material given at graduation. Flowsheet Row Cardiac Rehab from 05/22/2021 in [facility] Cardiac and Pulmonary Rehab  Education need identified 05/22/21       Medication Safety: - Group verbal and visual instruction to review commonly prescribed medications for heart and lung disease. Reviews the medication, class of the drug, and side effects. Includes the steps to properly store meds and maintain the prescription regimen.  Written material given at graduation.   Intimacy: - Group verbal instruction through game format to discuss how heart and lung disease can affect sexual intimacy. Written material given at graduation..   Know Your Numbers and Heart Failure: - Group verbal and visual instruction to discuss disease risk factors for cardiac and pulmonary disease and treatment options.  Reviews associated critical values for Overweight/Obesity, Hypertension, Cholesterol, and Diabetes.  Discusses basics of heart failure: signs/symptoms and treatments.  Introduces Heart Failure Zone chart for action plan for heart failure.  Written material given at graduation.   Infection Prevention: - Provides verbal and written material to individual with discussion of infection control including proper hand washing and proper equipment cleaning during exercise session. Flowsheet Row Cardiac Rehab from 05/22/2021 in [facility] Cardiac and Pulmonary Rehab  Education need identified 05/22/21   Date 05/22/21  Educator [place]  Instruction Review Code 1- Verbalizes Understanding       Falls Prevention: - Provides verbal and written material to individual with discussion of falls prevention and safety. Flowsheet Row Cardiac Rehab from 05/22/2021 in [facility] Cardiac and Pulmonary Rehab  Education need identified 05/22/21  Date 05/22/21  Educator [place]  Instruction Review Code 1- Verbalizes Understanding       Other: -Provides group and verbal instruction on various topics (see comments)   Knowledge Questionnaire Score:  Knowledge Questionnaire Score - 05/22/21 0933       Knowledge Questionnaire Score   Pre Score 21/26: Nutrition, Exercise, HR             Core Components/Risk Factors/Patient Goals at Admission:  Personal Goals and Risk Factors at Admission - 05/22/21 1422       Core Components/Risk Factors/Patient Goals on Admission    Weight Management Yes;Weight Loss    Intervention Weight Management: Develop a combined nutrition and exercise program designed to reach desired caloric intake, while maintaining appropriate intake of nutrient and fiber, sodium and fats, and appropriate energy expenditure required for the weight goal.;Weight Management: Provide education and appropriate resources to help participant work on and attain dietary goals.;Weight Management/Obesity: Establish reasonable short  term and long term weight goals.    Admit Weight 204 lb (92.5 kg)    Goal Weight: Short Term 200 lb (90.7 kg)    Goal Weight: Long Term 190 lb (86.2 kg)    Expected Outcomes Short Term: Continue to assess and modify interventions until short term weight is achieved;Long Term: Adherence to nutrition and physical activity/exercise program aimed toward attainment of established weight goal;Weight Loss: Understanding of general recommendations for a balanced deficit meal plan, which promotes 1-2 lb weight loss per week and includes a negative energy balance of [phone]  kcal/d;Understanding recommendations for meals to include 15-35% energy as protein, 25-35% energy from fat, 35-60% energy from carbohydrates, less than 240m of dietary cholesterol, 20-35 gm of total fiber daily;Understanding of distribution of calorie intake throughout the day with the consumption of 4-5 meals/snacks    Diabetes Yes    Intervention Provide education about signs/symptoms and action to take for hypo/hyperglycemia.;Provide education about proper nutrition, including hydration, and aerobic/resistive exercise prescription along with prescribed medications to achieve blood glucose in normal ranges: Fasting glucose 65-99 mg/dL    Expected Outcomes Short Term: Participant verbalizes understanding of the signs/symptoms and immediate care of hyper/hypoglycemia, proper foot care and importance of medication, aerobic/resistive exercise and nutrition plan for blood glucose control.;Long Term: Attainment of HbA1C < 7%.    Hypertension Yes    Intervention Provide education on lifestyle modifcations including regular physical activity/exercise, weight management, moderate sodium restriction and increased consumption of fresh fruit, vegetables, and low fat dairy, alcohol moderation, and smoking cessation.;Monitor prescription use compliance.    Expected Outcomes Short Term: Continued assessment and intervention until BP is < 140/915mHG in hypertensive participants. < 130/8038mG in hypertensive participants with diabetes, heart failure or chronic kidney disease.;Long Term: Maintenance of blood pressure at goal levels.    Lipids Yes    Intervention Provide education and support for participant on nutrition & aerobic/resistive exercise along with prescribed medications to achieve LDL <44m56mDL >40mg69m Expected Outcomes Short Term: Participant states understanding of desired cholesterol values and is compliant with medications prescribed. Participant is following exercise prescription and nutrition  guidelines.;Long Term: Cholesterol controlled with medications as prescribed, with individualized exercise RX and with personalized nutrition plan. Value goals: LDL < 44mg,26m > 40 mg.             Education:Diabetes - Individual verbal and written instruction to review signs/symptoms of diabetes, desired ranges of glucose level fasting, after meals and with exercise. Acknowledge that pre and post exercise glucose checks will be done for 3 sessions at entry of program. [place]  Education need identified 05/22/21  Date 05/22/21  Educator KL  InHazel Runruction Review Code 1- Verbalizes Understanding       Core Components/Risk Factors/Patient Goals Review:   Goals and Risk Factor Review     Row Name 06/12/21 0807 07/10/21 0802 07/31/21 0810 09/04/21 0807       Core Components/Risk Factors/Patient Goals Review   Personal Goals Review Weight Management/Obesity;Diabetes;Hypertension;Lipids Weight Management/Obesity;Diabetes;Hypertension;Lipids Weight Management/Obesity;Diabetes;Hypertension;Lipids Weight Management/Obesity;Diabetes;Hypertension;Lipids    Review Patient reports taking all medications as prescribed by her doctor. She monitors BP and BG at home and states she knows what to do if she has abnormal ranges. Most of the time her BP and BG are within acceptable ranges. Her weight have been steady. Jessyka wMarybethto her PCP Tuesday and he lowered her glipizide so she wouldn't have as  many BG lows. Discussed nutrition with pt 07/08/21 and reviewed paperwork today. She continues to take her BG at home. She has a BP cuff and doesn't take it often as it normally runs fine, encouraged her to check it when not at rehab. Today at rehab her BP was 140/70, discussed how this was slightly more elevated than recommended for a resting BP. She continues to take her medications as prescribed with no reported issues. Wynonia reports losing a couple  of pounds since beginning rehab, she mentioned with her PCP lowering her glipizide she can go longer without eating before having a low; encouraged her to still eat regular meals to meet her nutritional needs, but to choose more health promoting foods. Waylynn reports doing well on glipizide and reports not as many low BG. She continues to take her BG at home - about 115-120. She has a BP cuff and doesn't take it often as it normally runs fine, encouraged her to check it when not at rehab. Today at rehab her BP was 132/64. She continues to take her medications as prescribed with no reported issues. Honestie reports losing about 8 pounds since beginning rehab, she had mentioned with her PCP lowering her glipizide she can go longer without eating before having a low; reviewed that she should still eat regular meals to meet her nutritional needs, but to choose more health promoting foods. Shawonda reports doing well on glipizide and reports not as many low BG. She continues to take her BG at home - about 110-130s. She has a BP cuff and still doesn't take it often as it normally runs fine, encouraged her to check it when not at rehab. Today at rehab her BP was 126/74. She continues to take her medications as prescribed with no reported issues. Zissel reportsgainign about 3 pounds since leaving for vacation beginning rehab - she continues to work towards losing weight.    Expected Outcomes Short: continue to take all meds and monitor BP and BG at home. Long: control cardiac risk facotrs with healthy lifestyle changes. ST: continue to take all meds and monitor BP and BG at home. LT: control cardiac risk facotrs with healthy lifestyle changes. ST: continue to take all meds and monitor BP and BG at home. LT: control cardiac risk facotrs with healthy lifestyle changes. ST: continue to take all meds and monitor BP and BG at home. LT: control cardiac risk facotrs with healthy lifestyle changes.             Core Components/Risk  Factors/Patient Goals at Discharge (Final Review):   Goals and Risk Factor Review - 09/04/21 0807       Core Components/Risk Factors/Patient Goals Review   Personal Goals Review Weight Management/Obesity;Diabetes;Hypertension;Lipids    Review Delani reports doing well on glipizide and reports not as many low BG. She continues to take her BG at home - about 110-130s. She has a BP cuff and still doesn't take it often as it normally runs fine, encouraged her to check it when not at rehab. Today at rehab her BP was 126/74. She continues to take her medications as prescribed with no reported issues. Mirka reportsgainign about 3 pounds since leaving for vacation beginning rehab - she continues to work towards losing weight.    Expected Outcomes ST: continue to take all meds and monitor BP and BG at home. LT: control cardiac risk facotrs with healthy lifestyle changes.             ITP Comments:  ITP Comments     Row Name 05/14/21 1426 05/22/21 0926 05/27/21 0756 05/28/21 0828 06/25/21 1321   ITP Comments Initial telephone orientation completed. Diagnosis can be found in [facility] 2[facility]. EP orientation scheduled for Thursday 3/16 at 8am. Completed 6MWT and gym orientation. Initial ITP created and sent for review to Dr. Emily Filbert, Medical Director. First full day of exercise!  Patient was oriented to gym and equipment including functions, settings, policies, and procedures.  Patient's individual exercise prescription and treatment plan were reviewed.  All starting workloads were established based on the results of the 6 minute walk test done at initial orientation visit.  The plan for exercise progression was also introduced and progression will be customized based on patient's performance and goals. 30 Day review completed. Medical Director ITP review done, changes made as directed, and signed approval by Medical Director.   New to program 30 Day review completed. Medical Director ITP review done, changes made as  directed, and signed approval by Medical Director.    [place] Name 07/07/21 1403 07/23/21 0835 08/20/21 1226 09/17/21 0807     ITP Comments Completed initial RD consultation 30 Day review completed. Medical Director ITP review done, changes made as directed, and signed approval by Medical Director. 30 Day review completed. Medical Director ITP review done, changes made as directed, and signed approval by Medical Director. 30 Day review completed. Medical Director ITP review done, changes made as directed, and signed approval by Medical Director.             Comments:

## 2021-09-18 DIAGNOSIS — Z955 Presence of coronary angioplasty implant and graft: Secondary | ICD-10-CM

## 2021-09-18 DIAGNOSIS — Z48812 Encounter for surgical aftercare following surgery on the circulatory system: Secondary | ICD-10-CM | POA: Diagnosis not present

## 2021-09-18 NOTE — Progress Notes (Signed)
Daily Session Note  Patient Details  Name: Tiffany Alexander MRN: [id number] Date of Birth: 05/25/1945 Referring Provider:   Flowsheet Row Cardiac Rehab from 05/22/2021 in [facility] Cardiac and Pulmonary Rehab  Referring Provider Kathlyn Sacramento MD       Encounter Date: 09/18/2021  Check In:  Session Check In - 09/18/21 0754       Check-In   Supervising physician immediately available to respond to emergencies See telemetry face sheet for immediately available ER MD    Location [facility]-Cardiac & Pulmonary Rehab    Staff Present Justin Mend, RCP,RRT,BSRT;Melissa [place], RDN, Luther Redo, MPA, RN    Virtual Visit No    Medication changes reported     No    Fall or balance concerns reported    No    Tobacco Cessation No Change    Warm-up and Cool-down Performed on first and last piece of equipment    Resistance Training Performed Yes    VAD Patient? No    PAD/SET Patient? No      Pain Assessment   Currently in Pain? No/denies                Social History   Tobacco Use  Smoking Status Never  Smokeless Tobacco Never    Goals Met:  Independence with exercise equipment Exercise tolerated well No report of concerns or symptoms today Strength training completed today  Goals Unmet:  Not Applicable  Comments: Pt able to follow exercise prescription today without complaint.  Will continue to monitor for progression.    Dr. Emily Filbert is Medical Director for [place].  Dr. Ottie Glazier is Medical Director for [facility] Pulmonary Rehabilitation.

## 2021-09-23 DIAGNOSIS — Z955 Presence of coronary angioplasty implant and graft: Secondary | ICD-10-CM | POA: Diagnosis not present

## 2021-09-23 DIAGNOSIS — Z48812 Encounter for surgical aftercare following surgery on the circulatory system: Secondary | ICD-10-CM | POA: Diagnosis not present

## 2021-09-23 NOTE — Progress Notes (Signed)
Daily Session Note  Patient Details  Name: Tiffany Alexander MRN: [id number] Date of Birth: June 12, 1945 Referring Provider:   Flowsheet Row Cardiac Rehab from 05/22/2021 in [facility] Cardiac and Pulmonary Rehab  Referring Provider Kathlyn Sacramento MD       Encounter Date: 09/23/2021  Check In:  Session Check In - 09/23/21 0800       Check-In   Supervising physician immediately available to respond to emergencies See telemetry face sheet for immediately available ER MD    Location [facility]-Cardiac & Pulmonary Rehab    Staff Present Earlean Shawl, BS, ACSM CEP, Exercise Physiologist;Gloriajean Okun, RN,BC,MSN;Jessica [place], MA, RCEP, CCRP, CCET    Virtual Visit No    Medication changes reported     No    Fall or balance concerns reported    No    Tobacco Cessation No Change    Warm-up and Cool-down Performed on first and last piece of equipment    Resistance Training Performed Yes    VAD Patient? No    PAD/SET Patient? No      Pain Assessment   Currently in Pain? No/denies    Multiple Pain Sites No                Social History   Tobacco Use  Smoking Status Never  Smokeless Tobacco Never    Goals Met:  Independence with exercise equipment Exercise tolerated well No report of concerns or symptoms today  Goals Unmet:  Not Applicable  Comments: Pt able to follow exercise prescription today without complaint.  Will continue to monitor for progression.    Dr. Emily Filbert is Medical Director for [place].  Dr. Ottie Glazier is Medical Director for [facility] Pulmonary Rehabilitation.

## 2021-09-25 ENCOUNTER — Encounter: Payer: Medicare HMO | Admitting: *Deleted

## 2021-09-25 DIAGNOSIS — Z48812 Encounter for surgical aftercare following surgery on the circulatory system: Secondary | ICD-10-CM | POA: Diagnosis not present

## 2021-09-25 DIAGNOSIS — Z955 Presence of coronary angioplasty implant and graft: Secondary | ICD-10-CM

## 2021-09-25 NOTE — Progress Notes (Signed)
Daily Session Note  Patient Details  Name: Tiffany Alexander MRN: [id number] Date of Birth: 06-07-1945 Referring Provider:   Flowsheet Row Cardiac Rehab from 05/22/2021 in [facility] Cardiac and Pulmonary Rehab  Referring Provider Kathlyn Sacramento MD       Encounter Date: 09/25/2021  Check In:  Session Check In - 09/25/21 0928       Check-In   Supervising physician immediately available to respond to emergencies See telemetry face sheet for immediately available ER MD    Location [facility]-Cardiac & Pulmonary Rehab    Staff Present Heath Lark, RN, BSN, CCRP;Kristen Coble, RN,BC,MSN;Jessica [place], [place], RCEP, CCRP, CCET    Virtual Visit No    Medication changes reported     No    Fall or balance concerns reported    No    Warm-up and Cool-down Performed on first and last piece of equipment    Resistance Training Performed Yes    VAD Patient? No    PAD/SET Patient? No      Pain Assessment   Currently in Pain? No/denies                Social History   Tobacco Use  Smoking Status Never  Smokeless Tobacco Never    Goals Met:  Independence with exercise equipment Exercise tolerated well No report of concerns or symptoms today  Goals Unmet:  Not Applicable  Comments: Pt able to follow exercise prescription today without complaint.  Will continue to monitor for progression.    Dr. Emily Filbert is Medical Director for [place].  Dr. Ottie Glazier is Medical Director for [facility] Pulmonary Rehabilitation.

## 2021-09-29 DIAGNOSIS — M5136 Other intervertebral disc degeneration, lumbar region: Secondary | ICD-10-CM | POA: Diagnosis not present

## 2021-09-29 DIAGNOSIS — Z79899 Other long term (current) drug therapy: Secondary | ICD-10-CM | POA: Diagnosis not present

## 2021-09-29 DIAGNOSIS — M5416 Radiculopathy, lumbar region: Secondary | ICD-10-CM | POA: Diagnosis not present

## 2021-09-30 DIAGNOSIS — Z48812 Encounter for surgical aftercare following surgery on the circulatory system: Secondary | ICD-10-CM | POA: Diagnosis not present

## 2021-09-30 DIAGNOSIS — Z955 Presence of coronary angioplasty implant and graft: Secondary | ICD-10-CM | POA: Diagnosis not present

## 2021-09-30 NOTE — Progress Notes (Signed)
Daily Session Note  Patient Details  Name: Tiffany Alexander MRN: [id number] Date of Birth: 01/04/1946 Referring Provider:   Flowsheet Row Cardiac Rehab from 05/22/2021 in [facility] Cardiac and Pulmonary Rehab  Referring Provider Kathlyn Sacramento MD       Encounter Date: 09/30/2021  Check In:  Session Check In - 09/30/21 0752       Check-In   Supervising physician immediately available to respond to emergencies See telemetry face sheet for immediately available ER MD    Location [facility]-Cardiac & Pulmonary Rehab    Staff Present Will Bonnet, RN,BC,MSN;Jessica [place], [place], RCEP, CCRP, [place], BS, ACSM CEP, Exercise Physiologist    Virtual Visit No    Medication changes reported     No    Fall or balance concerns reported    No    Tobacco Cessation No Change    Warm-up and Cool-down Performed on first and last piece of equipment    Resistance Training Performed Yes    VAD Patient? No    PAD/SET Patient? No      Pain Assessment   Currently in Pain? No/denies    Multiple Pain Sites No                Social History   Tobacco Use  Smoking Status Never  Smokeless Tobacco Never    Goals Met:  Independence with exercise equipment Exercise tolerated well No report of concerns or symptoms today Strength training completed today  Goals Unmet:  Not Applicable  Comments: Pt able to follow exercise prescription today without complaint.  Will continue to monitor for progression.    Dr. Emily Filbert is Medical Director for [place].  Dr. Ottie Glazier is Medical Director for [facility] Pulmonary Rehabilitation.

## 2021-10-09 ENCOUNTER — Encounter: Payer: Medicare HMO | Attending: Cardiovascular Disease | Admitting: *Deleted

## 2021-10-09 DIAGNOSIS — Z955 Presence of coronary angioplasty implant and graft: Secondary | ICD-10-CM | POA: Insufficient documentation

## 2021-10-09 NOTE — Progress Notes (Signed)
Daily Session Note  Patient Details  Name: Tiffany Alexander MRN: [id number] Date of Birth: 28-Nov-1945 Referring Provider:   Flowsheet Row Cardiac Rehab from 05/22/2021 in [facility] Cardiac and Pulmonary Rehab  Referring Provider Kathlyn Sacramento MD       Encounter Date: 10/09/2021  Check In:  Session Check In - 10/09/21 0811       Check-In   Supervising physician immediately available to respond to emergencies See telemetry face sheet for immediately available ER MD    Location [facility]-Cardiac & Pulmonary Rehab    Staff Present Heath Lark, RN, BSN, CCRP;Melissa [place], RDN, Tawanna Solo, MS, ASCM CEP, Exercise Physiologist    Virtual Visit No    Medication changes reported     No    Fall or balance concerns reported    No    Warm-up and Cool-down Performed on first and last piece of equipment    Resistance Training Performed Yes    VAD Patient? No    PAD/SET Patient? No      Pain Assessment   Currently in Pain? No/denies                Social History   Tobacco Use  Smoking Status Never  Smokeless Tobacco Never    Goals Met:  Independence with exercise equipment Exercise tolerated well No report of concerns or symptoms today  Goals Unmet:  Not Applicable  Comments: Pt able to follow exercise prescription today without complaint.  Will continue to monitor for progression.    Dr. Emily Filbert is Medical Director for [place].  Dr. Ottie Glazier is Medical Director for [facility] Pulmonary Rehabilitation.

## 2021-10-13 ENCOUNTER — Other Ambulatory Visit: Payer: Self-pay | Admitting: Pulmonary Disease

## 2021-10-14 ENCOUNTER — Encounter: Payer: Medicare HMO | Admitting: *Deleted

## 2021-10-14 DIAGNOSIS — Z955 Presence of coronary angioplasty implant and graft: Secondary | ICD-10-CM

## 2021-10-14 NOTE — Progress Notes (Signed)
Daily Session Note  Patient Details  Name: Tiffany Alexander MRN: [id number] Date of Birth: 07-16-1945 Referring Provider:   Flowsheet Row Cardiac Rehab from 05/22/2021 in [facility] Cardiac and Pulmonary Rehab  Referring Provider Kathlyn Sacramento MD       Encounter Date: 10/14/2021  Check In:  Session Check In - 10/14/21 0758       Check-In   Supervising physician immediately available to respond to emergencies See telemetry face sheet for immediately available ER MD    Location [facility]-Cardiac & Pulmonary Rehab    Staff Present Nyoka Cowden, RN, BSN, Fenton Foy, BS, Exercise Physiologist;Jessica [place], MA, RCEP, CCRP, CCET    Virtual Visit No    Medication changes reported     No    Fall or balance concerns reported    No    Tobacco Cessation No Change    Warm-up and Cool-down Performed on first and last piece of equipment    Resistance Training Performed Yes    VAD Patient? No    PAD/SET Patient? No      Pain Assessment   Currently in Pain? No/denies                Social History   Tobacco Use  Smoking Status Never  Smokeless Tobacco Never    Goals Met:  Independence with exercise equipment Exercise tolerated well No report of concerns or symptoms today  Goals Unmet:  Not Applicable  Comments: Pt able to follow exercise prescription today without complaint.  Will continue to monitor for progression.    Dr. Emily Filbert is Medical Director for [place].  Dr. Ottie Glazier is Medical Director for [facility] Pulmonary Rehabilitation.

## 2021-10-15 ENCOUNTER — Encounter: Payer: Self-pay | Admitting: *Deleted

## 2021-10-15 DIAGNOSIS — Z955 Presence of coronary angioplasty implant and graft: Secondary | ICD-10-CM

## 2021-10-15 NOTE — Progress Notes (Signed)
Cardiac Individual Treatment Plan  Patient Details  Name: Tiffany Alexander MRN: [id number] Date of Birth: 02/29/1946 Referring Provider:   Flowsheet Row Cardiac Rehab from 05/22/2021 in [facility]  Referring Provider Arida, Muhammad MD       Initial Encounter Date:  Flowsheet Row Cardiac Rehab from 05/22/2021 in [facility]  Date 05/22/21       Visit Diagnosis: Status post coronary artery stent placement  Patient's Home Medications on Admission:  Current Outpatient Medications:    albuterol (VENTOLIN HFA) 108 (90 Base) MCG/ACT inhaler, 2 puffs Q4H PRN, Disp: 18 g, Rfl: 0   amLODipine (NORVASC) 10 MG tablet, Take 10 mg by mouth at bedtime., Disp: , Rfl:    ascorbic acid (VITAMIN C) 500 MG tablet, Take 500 mg by mouth daily., Disp: , Rfl:    aspirin 81 MG chewable tablet, Chew 1 tablet (81 mg total) by mouth daily., Disp: 90 tablet, Rfl: 0   budesonide (PULMICORT) 0.25 MG/2ML nebulizer solution, Take 2 mLs (0.25 mg total) by nebulization 2 (two) times daily., Disp: 120 mL, Rfl: 6   calcium citrate-vitamin D (CITRACAL+D) 315-200 MG-UNIT tablet, Take 1 tablet by mouth 2 (two) times daily., Disp: , Rfl:    carvedilol (COREG) 25 MG tablet, Take 1 tablet (25 mg total) by mouth 2 (two) times daily., Disp: 180 tablet, Rfl: 0   cephALEXin (KEFLEX) 250 MG capsule, Take 250 mg by mouth daily. As needed for UTI, Disp: , Rfl:    Cholecalciferol (VITAMIN D3) 1000 units CAPS, Take by mouth daily., Disp: , Rfl:    citalopram (CELEXA) 20 MG tablet, Take by mouth., Disp: , Rfl:    clopidogrel (PLAVIX) 75 MG tablet, Take 1 tablet (75 mg total) by mouth daily with breakfast., Disp: 90 tablet, Rfl: 3   Cyanocobalamin (VITAMIN B 12 PO), Take 1 tablet by mouth daily., Disp: , Rfl:    diphenhydrAMINE (BENADRYL) 25 MG tablet, Take 75 mg by mouth daily as needed for allergies., Disp: , Rfl:    diphenoxylate-atropine (LOMOTIL) 2.5-0.025 MG tablet, Take 1 tablet by  mouth 2 (two) times daily as needed for diarrhea or loose stools., Disp: , Rfl:    fluticasone (FLONASE) 50 MCG/ACT nasal spray, Place 2 sprays into both nostrils 2 (two) times daily., Disp: , Rfl:    formoterol (PERFOROMIST) 20 MCG/2ML nebulizer solution, USE ONE VIAL VIA NEBULIZER AND INHALE BY MOUTH TWICE DAILY, Disp: 120 mL, Rfl: 11   furosemide (LASIX) 20 MG tablet, Take 20 mg by mouth daily., Disp: , Rfl:    glipiZIDE (GLUCOTROL XL) 5 MG 24 hr tablet, Take 5 mg by mouth daily with breakfast., Disp: , Rfl:    hydrALAZINE (APRESOLINE) 50 MG tablet, Take 3 tablets (150 mg) by mouth TWICE daily (or every 12 hours) (Patient taking differently: Take 2 tablets in the am & 1 tablet in the pm), Disp: 180 tablet, Rfl: 6   hydrOXYzine (ATARAX/VISTARIL) 10 MG tablet, Take 10 mg by mouth every 8 (eight) hours as needed for itching., Disp: , Rfl:    Lansoprazole (PREVACID PO), Take by mouth., Disp: , Rfl:    losartan (COZAAR) 100 MG tablet, Take 100 mg by mouth at bedtime., Disp: , Rfl:    Melatonin 10 MG CAPS, Take 20 mg by mouth at bedtime., Disp: , Rfl:    Multiple Vitamins-Minerals (WOMENS MULTIVITAMIN PO), Take 1 tablet by mouth daily., Disp: , Rfl:    ondansetron (ZOFRAN-ODT) 4 MG disintegrating tablet, Take   1 tablet (4 mg total) by mouth every 8 (eight) hours as needed for nausea or vomiting., Disp: 20 tablet, Rfl: 0   Probiotic Product (PROBIOTIC BLEND PO), Take 1 capsule by mouth daily., Disp: , Rfl:    rosuvastatin (CRESTOR) 20 MG tablet, Take 1 tablet (20 mg total) by mouth at bedtime., Disp: 30 tablet, Rfl: 5   traMADol (ULTRAM) 50 MG tablet, Take 50 mg by mouth 3 (three) times daily as needed for moderate pain., Disp: , Rfl:    traZODone (DESYREL) 150 MG tablet, Take 150 mg by mouth at bedtime., Disp: , Rfl:  No current facility-administered medications for this visit.  Facility-Administered Medications Ordered in Other Visits:    albuterol (PROVENTIL) (2.5 MG/3ML) 0.083% nebulizer solution  2.5 mg, 2.5 mg, Nebulization, Once, Simonds, David B, MD  Past Medical History: Past Medical History:  Diagnosis Date   Arthritis    osteoarthritis   Carpal tunnel syndrome of left wrist    Depression    Diabetes mellitus without complication (HCC)    Diverticulosis    GERD (gastroesophageal reflux disease)    Hypertension    PONV (postoperative nausea and vomiting)    PONV (postoperative nausea and vomiting)    Pulmonary fibrosis (HCC)    Recurrent UTI    SUI (stress urinary incontinence, female)    Wears dentures    full upper and lower    Tobacco Use: Social History   Tobacco Use  Smoking Status Never  Smokeless Tobacco Never    Labs: Review Flowsheet        No data to display           Exercise Target Goals: Exercise Program Goal: Individual exercise prescription set using results from initial 6 min walk test and THRR while considering  patient's activity barriers and safety.   Exercise Prescription Goal: Initial exercise prescription builds to 30-45 minutes a day of aerobic activity, 2-3 days per week.  Home exercise guidelines will be given to patient during program as part of exercise prescription that the participant will acknowledge.   Education: Aerobic Exercise: - Group verbal and visual presentation on the components of exercise prescription. Introduces F.I.T.T principle from ACSM for exercise prescriptions.  Reviews F.I.T.T. principles of aerobic exercise including progression. Written material given at graduation. Flowsheet Row Cardiac Rehab from 05/22/2021 in [facility]  Education need identified 05/22/21       Education: Resistance Exercise: - Group verbal and visual presentation on the components of exercise prescription. Introduces F.I.T.T principle from ACSM for exercise prescriptions  Reviews F.I.T.T. principles of resistance exercise including progression. Written material given at graduation.    Education:  Exercise & Equipment Safety: - Individual verbal instruction and demonstration of equipment use and safety with use of the equipment. Flowsheet Row Cardiac Rehab from 05/22/2021 in [facility]  Education need identified 05/22/21  Date 05/22/21  Educator KL  Instruction Review Code 1- Verbalizes Understanding       Education: Exercise Physiology & General Exercise Guidelines: - Group verbal and written instruction with models to review the exercise physiology of the cardiovascular system and associated critical values. Provides general exercise guidelines with specific guidelines to those with heart or lung disease.  Flowsheet Row Cardiac Rehab from 05/22/2021 in [facility]  Education need identified 05/22/21       Education: Flexibility, Balance, Mind/Body Relaxation: - Group verbal and visual presentation with interactive activity on the components of exercise prescription.   Introduces F.I.T.T principle from ACSM for exercise prescriptions. Reviews F.I.T.T. principles of flexibility and balance exercise training including progression. Also discusses the mind body connection.  Reviews various relaxation techniques to help reduce and manage stress (i.e. Deep breathing, progressive muscle relaxation, and visualization). Balance handout provided to take home. Written material given at graduation.   Activity Barriers & Risk Stratification:  Activity Barriers & Cardiac Risk Stratification - 05/22/21 0939       Activity Barriers & Cardiac Risk Stratification   Activity Barriers Balance Concerns;Arthritis;Joint Problems;Back Problems;Deconditioning;Muscular Weakness;Assistive Device;Shortness of Breath    Cardiac Risk Stratification Moderate             6 Minute Walk:  6 Minute Walk     Row Name 05/22/21 0940         6 Minute Walk   Phase Initial     Distance 435 feet     Walk Time 5.25 minutes  Break 3:45-4:25     # of Rest Breaks 1      MPH 0.94     METS 0.46     RPE 13     Perceived Dyspnea  2     VO2 Peak 1.63     Symptoms Yes (comment)     Comments SOB     Resting HR 62 bpm     Resting BP 154/74     Resting Oxygen Saturation  96 %     Exercise Oxygen Saturation  during 6 min walk 92 %     Max Ex. HR 74 bpm     Max Ex. BP 180/78     2 Minute Post BP 156/74              Oxygen Initial Assessment:   Oxygen Re-Evaluation:   Oxygen Discharge (Final Oxygen Re-Evaluation):   Initial Exercise Prescription:  Initial Exercise Prescription - 05/22/21 1400       Date of Initial Exercise RX and Referring Provider   Date 05/22/21    Referring Provider Arida, Muhammad MD      Oxygen   Maintain Oxygen Saturation 88% or higher      Recumbant Bike   Level 1    RPM 60    Minutes 15    METs 1      NuStep   Level 1    SPM 80    Minutes 15    METs 1      Biostep-RELP   Level 1    SPM 50    Minutes 15    METs 1      Track   Laps 5   as tolerated   Minutes 15    METs 1.27      Prescription Details   Frequency (times per week) 2    Duration Progress to 30 minutes of continuous aerobic without signs/symptoms of physical distress      Intensity   THRR 40-80% of Max Heartrate 94 - 126    Ratings of Perceived Exertion 11-13    Perceived Dyspnea 0-4      Progression   Progression Continue to progress workloads to maintain intensity without signs/symptoms of physical distress.      Resistance Training   Training Prescription Yes    Weight 2 lb    Reps 10-15             Perform Capillary Blood Glucose checks as needed.  Exercise Prescription Changes:   Exercise Prescription Changes     Row Name   05/22/21 1400 06/02/21 1100 06/12/21 0800 07/01/21 1600 07/28/21 1600     Response to Exercise   Blood Pressure (Admit) 154/74 110/58 -- 138/68 122/82   Blood Pressure (Exercise) 180/78 132/70 -- 142/64 --   Blood Pressure (Exit) 156/74 124/62 -- 120/62 124/70   Heart Rate (Admit) 62  bpm 66 bpm -- 55 bpm 60 bpm   Heart Rate (Exercise) 74 bpm 75 bpm -- 86 bpm 73 bpm   Heart Rate (Exit) 66 bpm 65 bpm -- 72 bpm 56 bpm   Oxygen Saturation (Admit) 96 % -- -- -- --   Oxygen Saturation (Exercise) 92 % -- -- -- --   Oxygen Saturation (Exit) 95 % -- -- -- --   Rating of Perceived Exertion (Exercise) 13 14 -- 12 12   Perceived Dyspnea (Exercise) 2 -- -- -- --   Symptoms SOB none -- fatigue fatigue   Comments walk test results 1st full day of exercise -- -- --   Duration -- Progress to 30 minutes of  aerobic without signs/symptoms of physical distress Progress to 30 minutes of  aerobic without signs/symptoms of physical distress Progress to 30 minutes of  aerobic without signs/symptoms of physical distress Progress to 30 minutes of  aerobic without signs/symptoms of physical distress   Intensity -- THRR unchanged THRR unchanged THRR unchanged THRR unchanged     Progression   Progression -- Continue to progress workloads to maintain intensity without signs/symptoms of physical distress. Continue to progress workloads to maintain intensity without signs/symptoms of physical distress. Continue to progress workloads to maintain intensity without signs/symptoms of physical distress. Continue to progress workloads to maintain intensity without signs/symptoms of physical distress.   Average METs -- 1.1 1.1 1.95 1.88     Resistance Training   Training Prescription -- Yes Yes Yes Yes   Weight -- 2 lb 2 lb 2 lb 2 lb   Reps -- 10-15 10-15 10-15 10-15     Interval Training   Interval Training -- No No No No     Recumbant Bike   Level -- -- -- 1 1   Watts -- -- -- -- 8   Minutes -- -- -- 15 15   METs -- -- -- 3.1 3     NuStep   Level -- 1 1 3 1   Minutes -- 15 15 15 15   METs -- 1.1 1.1 1.6 2     Biostep-RELP   Level -- 1 1 -- --   Minutes -- 15 15 -- --   METs -- 1 1 -- --     Track   Laps -- -- -- -- 3   Minutes -- -- -- -- 15   METs -- -- -- -- 1.3     Home Exercise Plan    Plans to continue exercise at -- -- Home (comment)  at 1-2 days a week of home exercies (walking, stretching, stregthening) at home when not in rehab. Home (comment)  at 1-2 days a week of home exercies (walking, stretching, stregthening) at home when not in rehab. Home (comment)  at 1-2 days a week of home exercies (walking, stretching, stregthening) at home when not in rehab.   Frequency -- -- Add 2 additional days to program exercise sessions. Add 2 additional days to program exercise sessions. Add 2 additional days to program exercise sessions.   Initial Home Exercises Provided -- -- 06/12/21 06/12/21 06/12/21     Oxygen   Maintain Oxygen Saturation --   88% or higher 88% or higher 88% or higher 88% or higher    Row Name 08/11/21 0900 08/25/21 1400 09/08/21 0800 09/22/21 0700 10/06/21 0800     Response to Exercise   Blood Pressure (Admit) 122/64 128/76 126/74 132/70 112/62   Blood Pressure (Exit) 120/70 118/62 142/70 128/72 104/62   Heart Rate (Admit) 60 bpm 62 bpm 65 bpm 66 bpm 73 bpm   Heart Rate (Exercise) 85 bpm 69 bpm 77 bpm 83 bpm 98 bpm   Heart Rate (Exit) 61 bpm 58 bpm 74 bpm 70 bpm 66 bpm   Rating of Perceived Exertion (Exercise) 12 12 13 12 13   Symptoms none none none none none   Duration Continue with 30 min of aerobic exercise without signs/symptoms of physical distress. Continue with 30 min of aerobic exercise without signs/symptoms of physical distress. Continue with 30 min of aerobic exercise without signs/symptoms of physical distress. Continue with 30 min of aerobic exercise without signs/symptoms of physical distress. Continue with 30 min of aerobic exercise without signs/symptoms of physical distress.   Intensity THRR unchanged THRR unchanged THRR unchanged THRR unchanged THRR unchanged     Progression   Progression Continue to progress workloads to maintain intensity without signs/symptoms of physical distress. Continue to progress workloads to maintain intensity  without signs/symptoms of physical distress. Continue to progress workloads to maintain intensity without signs/symptoms of physical distress. Continue to progress workloads to maintain intensity without signs/symptoms of physical distress. Continue to progress workloads to maintain intensity without signs/symptoms of physical distress.   Average METs 1.78 1.72 2.19 2.22 2.21     Resistance Training   Training Prescription Yes Yes Yes Yes Yes   Weight 2 lb 2 lb 2 lb 2 lb 0 lb   Reps 10-15 10-15 10-15 10-15 --  did not do resistance training     Interval Training   Interval Training No No No No No     Recumbant Bike   Level 1 1 1 1 1   Watts 9 10 9 12 12   Minutes 15 15 30 30 30   METs 2.31 2.31 2.31 2.42 2.41     NuStep   Level 2 3 2 4 3   Minutes 30 30 30 30 30   METs 15 1.7 2 1.6 2.1     Biostep-RELP   Level -- 1 -- -- --   Minutes -- 15 -- -- --   METs -- 1 -- -- --     Track   Laps -- 5 -- -- --   Minutes -- 15 -- -- --   METs -- 1.27 -- -- --     Home Exercise Plan   Plans to continue exercise at Home (comment)  at 1-2 days a week of home exercies (walking, stretching, stregthening) at home when not in rehab. Home (comment)  at 1-2 days a week of home exercies (walking, stretching, stregthening) at home when not in rehab. Home (comment)  at 1-2 days a week of home exercies (walking, stretching, stregthening) at home when not in rehab. Home (comment)  at 1-2 days a week of home exercies (walking, stretching, stregthening) at home when not in rehab. Home (comment)  at 1-2 days a week of home exercies (walking, stretching, stregthening) at home when not in rehab.   Frequency Add 2 additional days to program exercise sessions. Add 2 additional days to program exercise sessions. Add 2 additional days to program exercise sessions. Add 2 additional days   to program exercise sessions. Add 2 additional days to program exercise sessions.   Initial Home Exercises Provided 06/12/21  06/12/21 06/12/21 06/12/21 06/12/21     Oxygen   Maintain Oxygen Saturation 88% or higher 88% or higher 88% or higher 88% or higher 88% or higher            Exercise Comments:   Exercise Comments     Row Name 05/27/21 0756           Exercise Comments First full day of exercise!  Patient was oriented to gym and equipment including functions, settings, policies, and procedures.  Patient's individual exercise prescription and treatment plan were reviewed.  All starting workloads were established based on the results of the 6 minute walk test done at initial orientation visit.  The plan for exercise progression was also introduced and progression will be customized based on patient's performance and goals.                Exercise Goals and Review:   Exercise Goals     Row Name 05/22/21 1422             Exercise Goals   Increase Physical Activity Yes       Intervention Provide advice, education, support and counseling about physical activity/exercise needs.;Develop an individualized exercise prescription for aerobic and resistive training based on initial evaluation findings, risk stratification, comorbidities and participant's personal goals.       Expected Outcomes Short Term: Attend rehab on a regular basis to increase amount of physical activity.;Long Term: Add in home exercise to make exercise part of routine and to increase amount of physical activity.;Long Term: Exercising regularly at least 3-5 days a week.       Increase Strength and Stamina Yes       Intervention Provide advice, education, support and counseling about physical activity/exercise needs.;Develop an individualized exercise prescription for aerobic and resistive training based on initial evaluation findings, risk stratification, comorbidities and participant's personal goals.       Expected Outcomes Short Term: Increase workloads from initial exercise prescription for resistance, speed, and METs.;Short  Term: Perform resistance training exercises routinely during rehab and add in resistance training at home;Long Term: Improve cardiorespiratory fitness, muscular endurance and strength as measured by increased METs and functional capacity (6MWT)       Able to understand and use rate of perceived exertion (RPE) scale Yes       Intervention Provide education and explanation on how to use RPE scale       Expected Outcomes Short Term: Able to use RPE daily in rehab to express subjective intensity level;Long Term:  Able to use RPE to guide intensity level when exercising independently       Able to understand and use Dyspnea scale Yes       Intervention Provide education and explanation on how to use Dyspnea scale       Expected Outcomes Short Term: Able to use Dyspnea scale daily in rehab to express subjective sense of shortness of breath during exertion;Long Term: Able to use Dyspnea scale to guide intensity level when exercising independently       Knowledge and understanding of Target Heart Rate Range (THRR) Yes       Intervention Provide education and explanation of THRR including how the numbers were predicted and where they are located for reference       Expected Outcomes Short Term: Able to state/look up THRR;Long Term: Able to use   THRR to govern intensity when exercising independently;Short Term: Able to use daily as guideline for intensity in rehab       Able to check pulse independently Yes       Intervention Provide education and demonstration on how to check pulse in carotid and radial arteries.;Review the importance of being able to check your own pulse for safety during independent exercise       Expected Outcomes Short Term: Able to explain why pulse checking is important during independent exercise;Long Term: Able to check pulse independently and accurately       Understanding of Exercise Prescription Yes       Intervention Provide education, explanation, and written materials on patient's  individual exercise prescription       Expected Outcomes Short Term: Able to explain program exercise prescription;Long Term: Able to explain home exercise prescription to exercise independently                Exercise Goals Re-Evaluation :  Exercise Goals Re-Evaluation     Row Name 05/27/21 0756 06/02/21 1117 06/12/21 0824 07/01/21 1612 07/10/21 0811     Exercise Goal Re-Evaluation   Exercise Goals Review Increase Physical Activity;Able to understand and use rate of perceived exertion (RPE) scale;Knowledge and understanding of Target Heart Rate Range (THRR);Understanding of Exercise Prescription;Able to understand and use Dyspnea scale;Able to check pulse independently;Increase Strength and Stamina Increase Physical Activity;Increase Strength and Stamina Increase Physical Activity;Increase Strength and Stamina;Able to understand and use rate of perceived exertion (RPE) scale;Able to understand and use Dyspnea scale;Knowledge and understanding of Target Heart Rate Range (THRR);Able to check pulse independently;Understanding of Exercise Prescription Increase Physical Activity;Increase Strength and Stamina;Understanding of Exercise Prescription Increase Physical Activity;Increase Strength and Stamina;Understanding of Exercise Prescription   Comments Reviewed RPE and dyspnea scales, THR and program prescription with pt today.  Pt voiced understanding and was given a copy of goals to take home. Tiffany Alexander completed her first session of rehab and tolerated it well. RPEs were in good range. We will continue to monitor as she progresses throughout the program. Reviewed home exercise with pt today.  Pt plans to walk for exercise.  Reviewed THR, pulse, RPE, sign and symptoms, pulse oximetery and when to call 911 or MD.  Also discussed weather considerations and indoor options.  Pt voiced understanding. Tiffany Alexander returned today after being out for two weeks.  She was able to return to her current workloads without a  problem.  We will continue to monitor her progress. Tiffany Alexander reports doing no exercise at home right now, according to home exercise note, Tiffany Alexander reported she will walk for exercise - she does not remember this. She reports outside is too bumpy to walk, but she can walk inside. She reports not being able to go to a gym for exercise while at rehab because her family doesn't like her driving. She reports not having access to youtube for our exercise videos, but she does still have the exerciuse packet. She reports that her arms are weak and she got a cortisone shot which did not help - discussed how this can alleviate pain, but wont build muscle; discussed importance of resistance exercise.   Expected Outcomes Short: Use RPE daily to regulate intensity. Long: Follow program prescription in THR. Short: Continue to exercise at current exercise prescription Long: Increase overall MET level Short: add 1-2 days of exercise at home on off days of rehab. Long: become independent with exercise program. Short: Return to regular attendance Long; Conintue to  improve stamina. ST: Return to regular attendance, exercidse outside of rehab  LT; Conintue to improve stamina.    Tiffany Alexander Name 07/28/21 1558 07/31/21 0827 08/11/21 0932 08/25/21 1423 09/04/21 0815     Exercise Goal Re-Evaluation   Exercise Goals Review Increase Physical Activity;Increase Strength and Stamina;Understanding of Exercise Prescription Increase Physical Activity;Increase Strength and Stamina;Understanding of Exercise Prescription Increase Physical Activity;Increase Strength and Stamina;Understanding of Exercise Prescription Increase Physical Activity;Increase Strength and Stamina;Understanding of Exercise Prescription Increase Physical Activity;Increase Strength and Stamina;Understanding of Exercise Prescription   Comments Daviana continues to have intermitten attendance due to transportation issues with her husband and his appointments.  We have encouraged her to try  to attend reguarly to get the most out of the program, but she has been averaging about once a week.  She is able to walk some with her wheelchair around the track and has started to walk down to rehab pushing the chair.  We will continue to montior her progress. Tiffany Alexander is not exercising at home right now - discussed walking at home as outside is too bumpy and ustilizing the exercise packet given by the EP during home exercise. We discussed utilizing household itema such as water bottles and canned items that can fit in her hand comfortably. EP spoke with Iriel about purchasing well made, cheap weights or resistance bands at [employer], [place], [employer], [place] told her she could give her some resistance band handouts and show her how to use them. Jaryiah has been coming when she is able to with transportation. She does not walk at rehab but has started to push her wheelchair down to rehab which is good for her as she was not able to do that before. She continues to work at level 1 on the recumbant bike and hope to see that increase to level 2. We will continue to monitor. Tiffany Alexander continues to do well in rehab. She was able to walk 5 laps on the track with a couple breaks in between. She also increased to level 3 on the T4 Nustep. She should attempt to increase to level 2 on the recumbant bike. Will continue to monitor. Tiffany Alexander is is still walking at home, but not outside. She has a walker at home. Encouraged her to ustilize the exercise packet given by the EP during home exercise as well as buying some cheap weights for resistance exercise at home.   Expected Outcomes Short: Aim for regular attendance in rehab Long; Conitnue to walk more Short: Aim for regular attendance in rehab, purchase resistance bands and/or weights Long: Conitnue to walk more Short: Continue to push wheelchair down to incorporate walking Long: Increase overall MET level Short: Increase level on RB Long: Continue to build up overall strength and  stamina Short: begin structured exercise outside of rehab  Long: Continue to build up overall strength and stamina    Row Name 09/08/21 0807 09/18/21 0810 09/22/21 0802 10/06/21 0824       Exercise Goal Re-Evaluation   Exercise Goals Review Increase Physical Activity;Increase Strength and Stamina;Understanding of Exercise Prescription Increase Physical Activity;Increase Strength and Stamina;Understanding of Exercise Prescription Increase Physical Activity;Increase Strength and Stamina;Understanding of Exercise Prescription Increase Physical Activity;Increase Strength and Stamina;Understanding of Exercise Prescription    Comments Tiffany Alexander is doing well in rehab. She improved her average overall METs to 2.19 METs. She has also tolerated 2 lb hand weights and may benefit from trying 3 lb for resistance training. Tiffany Alexander has tolerated level 1 on the recumbent bike  as well and could benefit from trying level 2. We will continue to monitor her progress in the program. Tiffany Alexander continues doing well in rehab. She has ordered a stationary bike for home which has not arrived yet. She is excited to use it. She continues to walk in the house for the meantime. Tiffany Alexander is doing well in rehab. She is up to level 4 on the T4. She also improved her overall average MET level to 2.22 METs. Tiffany Alexander has tolerated level 1 on the recumbent bike and may benefit from trying level 2. We will continue to monitor her progress in the program. Tiffany Alexander is doing well in rehab. She got up to 2.1 METs on the T4. She also was able to stay at 2.41 METs for the entire 30 minutes on the recumbent bike. She will look to improve on her post 6MWT soon. We will continue to monitor her progress in the program.    Expected Outcomes Short: try level 2 on the recumbent bike. Long: Continue to improve overall MET level. ST: start structured exercise at home at least 2x/week. LT: exercise at least 150 minutes of moderate aerobic activity Short: Try level 2 on the Recumbent  Bike. Long: Continue to improve overall MET levels. Short: Complete post 6MWT. Long: Continue to increase strength and stamina.             Discharge Exercise Prescription (Final Exercise Prescription Changes):  Exercise Prescription Changes - 10/06/21 0800       Response to Exercise   Blood Pressure (Admit) 112/62    Blood Pressure (Exit) 104/62    Heart Rate (Admit) 73 bpm    Heart Rate (Exercise) 98 bpm    Heart Rate (Exit) 66 bpm    Rating of Perceived Exertion (Exercise) 13    Symptoms none    Duration Continue with 30 min of aerobic exercise without signs/symptoms of physical distress.    Intensity THRR unchanged      Progression   Progression Continue to progress workloads to maintain intensity without signs/symptoms of physical distress.    Average METs 2.21      Resistance Training   Training Prescription Yes    Weight 0 lb    Reps --   did not do resistance training     Interval Training   Interval Training No      Recumbant Bike   Level 1    Watts 12    Minutes 30    METs 2.41      NuStep   Level 3    Minutes 30    METs 2.1      Home Exercise Plan   Plans to continue exercise at Home (comment)   at 1-2 days a week of home exercies (walking, stretching, stregthening) at home when not in rehab.   Frequency Add 2 additional days to program exercise sessions.    Initial Home Exercises Provided 06/12/21      Oxygen   Maintain Oxygen Saturation 88% or higher             Nutrition:  Target Goals: Understanding of nutrition guidelines, daily intake of sodium <1539m, cholesterol <2040m calories 30% from fat and 7% or less from saturated fats, daily to have 5 or more servings of fruits and vegetables.  Education: All About Nutrition: -Group instruction provided by verbal, written material, interactive activities, discussions, models, and posters to present general guidelines for heart healthy nutrition including fat, fiber, MyPlate, the role of  sodium  in heart healthy nutrition, utilization of the nutrition label, and utilization of this knowledge for meal planning. Follow up email sent as well. Written material given at graduation. Flowsheet Row Cardiac Rehab from 05/22/2021 in [facility]  Education need identified 05/22/21       Biometrics:  Pre Biometrics - 05/22/21 0939       Pre Biometrics   Height 5' 2.5" (1.588 m)    Weight 204 lb 11.2 oz (92.9 kg)    BMI (Calculated) 36.82    Single Leg Stand 0 seconds              Nutrition Therapy Plan and Nutrition Goals:  Nutrition Therapy & Goals - 07/07/21 1427       Nutrition Therapy   Diet Heart healthy, low Na, T2DM    Protein (specify units) 75g    Fiber 25 grams    Whole Grain Foods 3 servings    Saturated Fats 12 max. grams    Fruits and Vegetables 8 servings/day    Sodium 2 grams      Personal Nutrition Goals   Nutrition Goal ST: add at least 1 CHO serving to Breakfast and Lunch to avoid BG lows LT: Maintain A1C <7, follow MyPlate guidelines, eat consistent CHOs    Comments 75 y.o. F admitted to cardiac rehab s/p coronary artery placement. PMHx of asthma, HTN, T2DM, pulmonary fibrosis, coronary calcification, GERD, diverticulosis. Pt wears dentures. Relevant medications include vit C, Calcium w/ vit D, vit D3, vitamin B12, lasix, glipizide, lomotil, lansoprazole, MVI, zofran, probiotic, crestor, tramadol, trazodone. PYP Score: 58. Vegetables & Fruits 5/12. Breads, Grains & Cereals 7/12. Red & Processed Meat 4/12. Poultry 2/2. Fish & Shellfish 0/4. Beans, Nuts & Seeds 1/4. Milk & Dairy Foods 2/6. Toppings, Oils, Seasonings & Salt 13/20. Sweets, Snacks & Restaurant Food 14/14. Beverages 10/10. Coffee B: biscuit - not as much anymore, 2 eggs and toast (keto bread: 2 slices = 19g CHO, 6g fiber) L: will sometimes skip, sandwich (peanut butter or [place] - keto bread) or leftovers S: she will have low sugar around here D:3-4pm: meat with 2  vegetables. Discussed general heart healthy eating as well as T2DM MNT.      Intervention Plan   Intervention Prescribe, educate and counsel regarding individualized specific dietary modifications aiming towards targeted core components such as weight, hypertension, lipid management, diabetes, heart failure and other comorbidities.    Expected Outcomes Short Term Goal: Understand basic principles of dietary content, such as calories, fat, sodium, cholesterol and nutrients.;Short Term Goal: A plan has been developed with personal nutrition goals set during dietitian appointment.;Long Term Goal: Adherence to prescribed nutrition plan.             Nutrition Assessments:  MEDIFICTS Score Key: ?70 Need to make dietary changes  40-70 Heart Healthy Diet ? 40 Therapeutic Level Cholesterol Diet  Flowsheet Row Cardiac Rehab from 05/22/2021 in [facility]  Picture Your Plate Total Score on Admission 58      Picture Your Plate Scores: <40 Unhealthy dietary pattern with much room for improvement. 41-50 Dietary pattern unlikely to meet recommendations for good health and room for improvement. 51-60 More healthful dietary pattern, with some room for improvement.  >60 Healthy dietary pattern, although there may be some specific behaviors that could be improved.    Nutrition Goals Re-Evaluation:  Nutrition Goals Re-Evaluation     Row Name 06/12/21 0759 07/31/21 0814 09/04/21 0801 09/18/21 0804         Goals   Nutrition Goal -- ST: Continue to make sure meals and snacks have protein, fiber, and fat. Make sure meals have at least 2-3 CHO servings and snacks 1-2. LT: Maintain A1C <7, follow MyPlate guidelines, eat consistent CHOs ST: Practice MyPlate structure. LT: Maintain A1C <7, follow MyPlate guidelines, eat consistent CHOs ST: Practice MyPlate structure. LT: Maintain A1C <7, follow MyPlate guidelines, eat consistent CHOs    Comment Patient has not yet met with program  dietician to set nutrition goals. She has a scheduled appointment on June 26, 2021. She reports liking cooking and eating. She has optifast bar as one snack - 4g fiber, 17g CHO, protein 16g. She got a new whole grain bread and eats more vegetables; she is practicing MyPlate structure. She would like to continue with her current changes so far. We discussed some other healthy snacks such as fruit with nuts/seeds - she enjoys fruit, but it runs her BG up; discussed pairing CHO rich foods with protein and fat. She is baking chicken with vinegar - her grandchildren calls it vinegar chicken. Hang reports that her BG has been high because she went to [facility] and she reports not eating like she was supposed to like having coconut cake and then brownies when she got home because her grandson made them. She is enjoying her whole grain bread thats he bought and is continuing to eat more vegetables. She continues to practice MyPlate structure. Discussed practicing MyPlate structure even when she doesn't cook it herself like at church and at the 4th of July BBQ coming up. Tiffany Alexander reports getting back on track with her nutrition after vacation. She continues to enjoy her whole grain bread thats he bought and is continuing to eat more vegetables. She continues to practice MyPlate structure. She even was mindful about balance on her plate on July 4th.    Expected Outcome Short: Meet with program dietician to set nutrition goals. Long: Work towards diet changes given by Tourist information centre manager. ST: Continue to make sure meals and snacks have protein, fiber, and fat. Make sure meals have at least 2-3 CHO servings and snacks 1-2. LT: Maintain A1C <7, follow MyPlate guidelines, eat consistent CHOs ST: Practice MyPlate structure. LT: Maintain A1C <7, follow MyPlate guidelines, eat consistent CHOs ST: Practice MyPlate structure. LT: Maintain A1C <7, follow MyPlate guidelines, eat consistent CHOs             Nutrition Goals Discharge  (Final Nutrition Goals Re-Evaluation):  Nutrition Goals Re-Evaluation - 09/18/21 0804       Goals   Nutrition Goal ST: Practice MyPlate structure. LT: Maintain A1C <7, follow MyPlate guidelines, eat consistent CHOs    Comment Tiffany Alexander reports getting back on track with her nutrition after vacation. She continues to enjoy her whole grain bread thats he bought and is continuing to eat more vegetables. She continues to practice MyPlate structure. She even was mindful about balance on her plate on July 4th.    Expected Outcome ST: Practice MyPlate structure. LT: Maintain A1C <7, follow MyPlate guidelines, eat consistent CHOs             Psychosocial: Target Goals: Acknowledge presence or absence of significant depression and/or stress, maximize coping skills, provide positive support system. Participant is able to verbalize types and ability to use techniques and skills needed for reducing stress and depression.   Education: Stress, Anxiety, and Depression - Group verbal and visual presentation to define topics covered.  Reviews how body is impacted by stress,  anxiety, and depression.  Also discusses healthy ways to reduce stress and to treat/manage anxiety and depression.  Written material given at graduation.   Education: Sleep Hygiene -Provides group verbal and written instruction about how sleep can affect your health.  Define sleep hygiene, discuss sleep cycles and impact of sleep habits. Review good sleep hygiene tips.    Initial Review & Psychosocial Screening:  Initial Psych Review & Screening - 05/14/21 1411       Initial Review   Current issues with Current Stress Concerns      Family Dynamics   Good Support System? Yes   daughter     Barriers   Psychosocial barriers to participate in program There are no identifiable barriers or psychosocial needs.;The patient should benefit from training in stress management and relaxation.      Screening Interventions   Interventions  Encouraged to exercise;Provide feedback about the scores to participant;To provide support and resources with identified psychosocial needs    Expected Outcomes Short Term goal: Utilizing psychosocial counselor, staff and physician to assist with identification of specific Stressors or current issues interfering with healing process. Setting desired goal for each stressor or current issue identified.;Long Term Goal: Stressors or current issues are controlled or eliminated.;Short Term goal: Identification and review with participant of any Quality of Life or Depression concerns found by scoring the questionnaire.;Long Term goal: The participant improves quality of Life and PHQ9 Scores as seen by post scores and/or verbalization of changes             Quality of Life Scores:   Quality of Life - 05/22/21 0936       Quality of Life   Select Quality of Life      Quality of Life Scores   Health/Function Pre 21 %    Socioeconomic Pre 24.3 %    Psych/Spiritual Pre 26.57 %    Family Pre 26.4 %    GLOBAL Pre 23.66 %            Scores of 19 and below usually indicate a poorer quality of life in these areas.  A difference of  2-3 points is a clinically meaningful difference.  A difference of 2-3 points in the total score of the Quality of Life Index has been associated with significant improvement in overall quality of life, self-image, physical symptoms, and general health in studies assessing change in quality of life.  PHQ-9: Review Flowsheet       07/15/2021 05/22/2021  Depression screen PHQ 2/9  Decreased Interest 0 0  Down, Depressed, Hopeless 0 2  PHQ - 2 Score 0 2  Altered sleeping 0 0  Tired, decreased energy 1 0  Change in appetite 0 3  Feeling bad or failure about yourself  0 1  Trouble concentrating 0 0  Moving slowly or fidgety/restless 0 0  Suicidal thoughts 0 0  PHQ-9 Score 1 6  Difficult doing work/chores Not difficult at all Somewhat difficult   Interpretation of  Total Score  Total Score Depression Severity:  1-4 = Minimal depression, 5-9 = Mild depression, 10-14 = Moderate depression, 15-19 = Moderately severe depression, 20-27 = Severe depression   Psychosocial Evaluation and Intervention:  Psychosocial Evaluation - 05/14/21 1421       Psychosocial Evaluation & Interventions   Interventions Encouraged to exercise with the program and follow exercise prescription;Stress management education    Comments Tiffany Alexander originally was not going to participate in cardiac rehab but changed her mind because the  copay was doable and she wants to feel better. She lives at home with her husband and their daughter helps her out. When asked about stress, she stated there was none she could talk about at that moment (due to her husband being in the room with her). She mentioned she hasn't had to reach out to anyone to talk about this stress, but staff told her the opportunity would present itself while she is here in the program if she wants to discuss what is going on. She said she felt safe in whatever the situation is. Her breathing difficulties have improved since after the stent. She still struggles with balance so she takes things slow so she doesn't fall. When asked about using a cane or a walker she mentioned her husband didn't want her to be dependent on one. She is extra careful when out of her normal setting so she doesn't get tripped up. Her daughter accompanies her to most of her apppointments. Tiffany Alexander is hoping this program will help boost her stamina.    Expected Outcomes Short; attend cardiac rehab for education and exercise. Long: develop and maintain positive self care habits.    Continue Psychosocial Services  Follow up required by staff             Psychosocial Re-Evaluation:  Psychosocial Re-Evaluation     Tiffany Alexander Name 06/12/21 0801 06/12/21 0809 07/10/21 0817 07/31/21 0832 09/04/21 1607     Psychosocial Re-Evaluation   Current issues with Current Stress  Concerns Current Stress Concerns Current Stress Concerns Current Stress Concerns Current Stress Concerns   Comments -- Patient reports no new stress or sleep concerns. She reports that some of her chruch friends are terminally and chronically ill and that makes her sad. She will take a nap to help relieve stress. Discussed some stress reducing activities as well as mindful meditation, she reports chores and taking care of her son keeps her busy so she does not have time for anything like that. She relies on her daughter and husband for support. She reports sleeping well. She reports that some of her chruch friends are terminally and chronically ill and that makes her sad, she reports that she has given that to the lord and she will make them food like giving them chicken salad. She would like to make a care package to her granddaughter when she goes to college. She reports her marriage is hard - this is her third marriage. Her son is also autistic and she reports this can be difficult at times. She will take a nap and read her bible to help relieve stress. She will also participate in her church as well to help relieve stress. She relies on her daughter for support - her daughter calls every day. She reports sleeping well. She reports that some of her church friends are terminally and chronically ill and that makes her sad, She reports her husband can be difficult to deal with. Her son is also autistic and she reports this can be difficult at times. She continues to take a nap, read her bible, and participate in her church as well to help relieve stress. She relies on her daughter for support - her daughter calls every day. She has been to [employer] this last week and visted her friend - she reports hvaing a good time even if she wasn't eating as well. She reports sleeping well.   Expected Outcomes -- Short: continue to attend cardiac rehab consistently for physical and mental health  benefits. Long: Maintain good  mental health habits and sleep patterns. ST: continue to attend cardiac rehab consistently for physical and mental health benefits. LT: Maintain good mental health habits and sleep patterns. ST: continue to attend cardiac rehab consistently for physical and mental health benefits. LT: Maintain good mental health habits and sleep patterns. ST: continue to attend cardiac rehab consistently for physical and mental health benefits. LT: Maintain good mental health habits and sleep patterns.   Continue Psychosocial Services  -- -- Follow up required by staff Follow up required by staff Follow up required by staff    [place] Name 09/18/21 0810             Psychosocial Re-Evaluation   Current issues with Current Stress Concerns       Comments She reports that some of her church friends are terminally and chronically ill and that makes her sad, She reports her husband can still be difficult to deal with. Her son is also autistic and she reports this can be difficult at times. She continues to take a nap, read her bible, and participate in her church as well to help relieve stress. She relies on her daughter for support - her daughter calls every day. She reports sleeping well.       Expected Outcomes ST: continue to attend cardiac rehab consistently for physical and mental health benefits. LT: Maintain good mental health habits and sleep patterns.       Interventions Encouraged to attend Cardiac Rehabilitation for the exercise       Continue Psychosocial Services  Follow up required by staff                Psychosocial Discharge (Final Psychosocial Re-Evaluation):  Psychosocial Re-Evaluation - 09/18/21 0810       Psychosocial Re-Evaluation   Current issues with Current Stress Concerns    Comments She reports that some of her church friends are terminally and chronically ill and that makes her sad, She reports her husband can still be difficult to deal with. Her son is also autistic and she reports  this can be difficult at times. She continues to take a nap, read her bible, and participate in her church as well to help relieve stress. She relies on her daughter for support - her daughter calls every day. She reports sleeping well.    Expected Outcomes ST: continue to attend cardiac rehab consistently for physical and mental health benefits. LT: Maintain good mental health habits and sleep patterns.    Interventions Encouraged to attend Cardiac Rehabilitation for the exercise    Continue Psychosocial Services  Follow up required by staff             Vocational Rehabilitation: Provide vocational rehab assistance to qualifying candidates.   Vocational Rehab Evaluation & Intervention:  Vocational Rehab - 05/14/21 1411       Initial Vocational Rehab Evaluation & Intervention   Assessment shows need for Vocational Rehabilitation No             Education: Education Goals: Education classes will be provided on a variety of topics geared toward better understanding of heart health and risk factor modification. Participant will state understanding/return demonstration of topics presented as noted by education test scores.  Learning Barriers/Preferences:  Learning Barriers/Preferences - 05/14/21 1411       Learning Barriers/Preferences   Learning Barriers None    Learning Preferences Individual Instruction  General Cardiac Education Topics:  AED/CPR: - Group verbal and written instruction with the use of models to demonstrate the basic use of the AED with the basic ABC's of resuscitation.   Anatomy and Cardiac Procedures: - Group verbal and visual presentation and models provide information about basic cardiac anatomy and function. Reviews the testing methods done to diagnose heart disease and the outcomes of the test results. Describes the treatment choices: Medical Management, Angioplasty, or Coronary Bypass Surgery for treating various heart conditions  including Myocardial Infarction, Angina, Valve Disease, and Cardiac Arrhythmias.  Written material given at graduation. Flowsheet Row Cardiac Rehab from 05/22/2021 in [facility]  Education need identified 05/22/21       Medication Safety: - Group verbal and visual instruction to review commonly prescribed medications for heart and lung disease. Reviews the medication, class of the drug, and side effects. Includes the steps to properly store meds and maintain the prescription regimen.  Written material given at graduation.   Intimacy: - Group verbal instruction through game format to discuss how heart and lung disease can affect sexual intimacy. Written material given at graduation..   Know Your Numbers and Heart Failure: - Group verbal and visual instruction to discuss disease risk factors for cardiac and pulmonary disease and treatment options.  Reviews associated critical values for Overweight/Obesity, Hypertension, Cholesterol, and Diabetes.  Discusses basics of heart failure: signs/symptoms and treatments.  Introduces Heart Failure Zone chart for action plan for heart failure.  Written material given at graduation.   Infection Prevention: - Provides verbal and written material to individual with discussion of infection control including proper hand washing and proper equipment cleaning during exercise session. Flowsheet Row Cardiac Rehab from 05/22/2021 in [facility]  Education need identified 05/22/21  Date 05/22/21  Educator Bennet  Instruction Review Code 1- Verbalizes Understanding       Falls Prevention: - Provides verbal and written material to individual with discussion of falls prevention and safety. Flowsheet Row Cardiac Rehab from 05/22/2021 in [facility] Cardiac and Pulmonary Rehab  Education need identified 05/22/21  Date 05/22/21  Educator [place]  Instruction Review Code 1- Verbalizes Understanding       Other: -Provides group and  verbal instruction on various topics (see comments)   Knowledge Questionnaire Score:  Knowledge Questionnaire Score - 05/22/21 0933       Knowledge Questionnaire Score   Pre Score 21/26: Nutrition, Exercise, HR             Core Components/Risk Factors/Patient Goals at Admission:  Personal Goals and Risk Factors at Admission - 05/22/21 1422       Core Components/Risk Factors/Patient Goals on Admission    Weight Management Yes;Weight Loss    Intervention Weight Management: Develop a combined nutrition and exercise program designed to reach desired caloric intake, while maintaining appropriate intake of nutrient and fiber, sodium and fats, and appropriate energy expenditure required for the weight goal.;Weight Management: Provide education and appropriate resources to help participant work on and attain dietary goals.;Weight Management/Obesity: Establish reasonable short term and long term weight goals.    Admit Weight 204 lb (92.5 kg)    Goal Weight: Short Term 200 lb (90.7 kg)    Goal Weight: Long Term 190 lb (86.2 kg)    Expected Outcomes Short Term: Continue to assess and modify interventions until short term weight is achieved;Long Term: Adherence to nutrition and physical activity/exercise program aimed toward attainment of established weight goal;Weight Loss: Understanding of general  recommendations for a balanced deficit meal plan, which promotes 1-2 lb weight loss per week and includes a negative energy balance of 500-1000 kcal/d;Understanding recommendations for meals to include 15-35% energy as protein, 25-35% energy from fat, 35-60% energy from carbohydrates, less than 200mg of dietary cholesterol, 20-35 gm of total fiber daily;Understanding of distribution of calorie intake throughout the day with the consumption of 4-5 meals/snacks    Diabetes Yes    Intervention Provide education about signs/symptoms and action to take for hypo/hyperglycemia.;Provide education about proper  nutrition, including hydration, and aerobic/resistive exercise prescription along with prescribed medications to achieve blood glucose in normal ranges: Fasting glucose 65-99 mg/dL    Expected Outcomes Short Term: Participant verbalizes understanding of the signs/symptoms and immediate care of hyper/hypoglycemia, proper foot care and importance of medication, aerobic/resistive exercise and nutrition plan for blood glucose control.;Long Term: Attainment of HbA1C < 7%.    Hypertension Yes    Intervention Provide education on lifestyle modifcations including regular physical activity/exercise, weight management, moderate sodium restriction and increased consumption of fresh fruit, vegetables, and low fat dairy, alcohol moderation, and smoking cessation.;Monitor prescription use compliance.    Expected Outcomes Short Term: Continued assessment and intervention until BP is < 140/90mm HG in hypertensive participants. < 130/80mm HG in hypertensive participants with diabetes, heart failure or chronic kidney disease.;Long Term: Maintenance of blood pressure at goal levels.    Lipids Yes    Intervention Provide education and support for participant on nutrition & aerobic/resistive exercise along with prescribed medications to achieve LDL <70mg, HDL >40mg.    Expected Outcomes Short Term: Participant states understanding of desired cholesterol values and is compliant with medications prescribed. Participant is following exercise prescription and nutrition guidelines.;Long Term: Cholesterol controlled with medications as prescribed, with individualized exercise RX and with personalized nutrition plan. Value goals: LDL < 70mg, HDL > 40 mg.             Education:Diabetes - Individual verbal and written instruction to review signs/symptoms of diabetes, desired ranges of glucose level fasting, after meals and with exercise. Acknowledge that pre and post exercise glucose checks will be done for 3 sessions at entry of  program. Flowsheet Row Cardiac Rehab from 05/22/2021 in [facility] Cardiac and Pulmonary Rehab  Education need identified 05/22/21  Date 05/22/21  Educator [place]  Instruction Review Code 1- Verbalizes Understanding       Core Components/Risk Factors/Patient Goals Review:   Goals and Risk Factor Review     Row Name 06/12/21 0807 07/10/21 0802 07/31/21 0810 09/04/21 0807 09/18/21 0807     Core Components/Risk Factors/Patient Goals Review   Personal Goals Review Weight Management/Obesity;Diabetes;Hypertension;Lipids Weight Management/Obesity;Diabetes;Hypertension;Lipids Weight Management/Obesity;Diabetes;Hypertension;Lipids Weight Management/Obesity;Diabetes;Hypertension;Lipids Weight Management/Obesity;Diabetes;Hypertension;Lipids   Review Patient reports taking all medications as prescribed by her doctor. She monitors BP and BG at home and states she knows what to do if she has abnormal ranges. Most of the time her BP and BG are within acceptable ranges. Her weight have been steady. Benetta went to her PCP Tuesday and he lowered her glipizide so she wouldn't have as many BG lows. Discussed nutrition with pt 07/08/21 and reviewed paperwork today. She continues to take her BG at home. She has a BP cuff and doesn't take it often as it normally runs fine, encouraged her to check it when not at rehab. Today at rehab her BP was 140/70, discussed how this was slightly more elevated than recommended for a resting BP. She continues to take her medications as prescribed with no   reported issues. Charitie reports losing a couple of pounds since beginning rehab, she mentioned with her PCP lowering her glipizide she can go longer without eating before having a low; encouraged her to still eat regular meals to meet her nutritional needs, but to choose more health promoting foods. Nidya reports doing well on glipizide and reports not as many low BG. She continues to take her BG at home - about 115-120. She has a BP cuff and doesn't  take it often as it normally runs fine, encouraged her to check it when not at rehab. Today at rehab her BP was 132/64. She continues to take her medications as prescribed with no reported issues. Miyah reports losing about 8 pounds since beginning rehab, she had mentioned with her PCP lowering her glipizide she can go longer without eating before having a low; reviewed that she should still eat regular meals to meet her nutritional needs, but to choose more health promoting foods. Tiffany Alexander reports doing well on glipizide and reports not as many low BG. She continues to take her BG at home - about 110-130s. She has a BP cuff and still doesn't take it often as it normally runs fine, encouraged her to check it when not at rehab. Today at rehab her BP was 126/74. She continues to take her medications as prescribed with no reported issues. Tiffany Alexander reportsgainign about 3 pounds since leaving for vacation beginning rehab - she continues to work towards losing weight. Tiffany Alexander reports continuing to do well on her glipizde - she is having minimal lows and if she needs she has hard candy on hand. She continues to take her BG at home - about 1115-135s. She has a BP cuff and still doesn't take it often as it normally runs fine, again encouraged her to check it when not at rehab. Today at rehab her BP was 132/70. She continues to take her medications as prescribed with no reported issues. Tiffany Alexander reports her weight has been around 200lbs. She feels that whenever she smiles in the mirror it looks like she had a stroke, no other symptoms - encouraged her to speak with her doctor.   Expected Outcomes Short: continue to take all meds and monitor BP and BG at home. Long: control cardiac risk facotrs with healthy lifestyle changes. ST: continue to take all meds and monitor BP and BG at home. LT: control cardiac risk facotrs with healthy lifestyle changes. ST: continue to take all meds and monitor BP and BG at home. LT: control cardiac risk facotrs  with healthy lifestyle changes. ST: continue to take all meds and monitor BP and BG at home. LT: control cardiac risk facotrs with healthy lifestyle changes. ST: continue to take all meds and monitor BP and BG at home. LT: control cardiac risk facotrs with healthy lifestyle changes.            Core Components/Risk Factors/Patient Goals at Discharge (Final Review):   Goals and Risk Factor Review - 09/18/21 0807       Core Components/Risk Factors/Patient Goals Review   Personal Goals Review Weight Management/Obesity;Diabetes;Hypertension;Lipids    Review Tiffany Alexander reports continuing to do well on her glipizde - she is having minimal lows and if she needs she has hard candy on hand. She continues to take her BG at home - about 1115-135s. She has a BP cuff and still doesn't take it often as it normally runs fine, again encouraged her to check it when not at rehab. Today at rehab her   BP was 132/70. She continues to take her medications as prescribed with no reported issues. Tiffany Alexander reports her weight has been around 200lbs. She feels that whenever she smiles in the mirror it looks like she had a stroke, no other symptoms - encouraged her to speak with her doctor.    Expected Outcomes ST: continue to take all meds and monitor BP and BG at home. LT: control cardiac risk facotrs with healthy lifestyle changes.             ITP Comments:  ITP Comments     Row Name 05/14/21 1426 05/22/21 0926 05/27/21 0756 05/28/21 0828 06/25/21 1321   ITP Comments Initial telephone orientation completed. Diagnosis can be found in [facility] 2/27. EP orientation scheduled for Thursday 3/16 at 8am. Completed 6MWT and gym orientation. Initial ITP created and sent for review to Dr. Emily Filbert, Medical Director. First full day of exercise!  Patient was oriented to gym and equipment including functions, settings, policies, and procedures.  Patient's individual exercise prescription and treatment plan were reviewed.  All starting  workloads were established based on the results of the 6 minute walk test done at initial orientation visit.  The plan for exercise progression was also introduced and progression will be customized based on patient's performance and goals. 30 Day review completed. Medical Director ITP review done, changes made as directed, and signed approval by Medical Director.   New to program 30 Day review completed. Medical Director ITP review done, changes made as directed, and signed approval by Medical Director.    [place] Name 07/07/21 1403 07/23/21 0835 08/20/21 1226 09/17/21 0807 10/15/21 0709   ITP Comments Completed initial RD consultation 30 Day review completed. Medical Director ITP review done, changes made as directed, and signed approval by Medical Director. 30 Day review completed. Medical Director ITP review done, changes made as directed, and signed approval by Medical Director. 30 Day review completed. Medical Director ITP review done, changes made as directed, and signed approval by Medical Director. 30 Day review completed. Medical Director ITP review done, changes made as directed, and signed approval by Medical Director.            Comments:

## 2021-10-21 ENCOUNTER — Encounter: Payer: Medicare HMO | Admitting: *Deleted

## 2021-10-21 VITALS — Ht 62.5 in | Wt 197.9 lb

## 2021-10-21 DIAGNOSIS — Z955 Presence of coronary angioplasty implant and graft: Secondary | ICD-10-CM

## 2021-10-21 NOTE — Progress Notes (Addendum)
Daily Session Note  Patient Details  Name: Tiffany Alexander MRN: [id number] Date of Birth: 08-May-1945 Referring Provider:   Flowsheet Row Cardiac Rehab from 05/22/2021 in [facility] Cardiac and Pulmonary Rehab  Referring Provider Kathlyn Sacramento MD       Encounter Date: 10/21/2021  Check In:  Session Check In - 10/21/21 0839       Check-In   Supervising physician immediately available to respond to emergencies See telemetry face sheet for immediately available ER MD    Location [facility]-Cardiac & Pulmonary Rehab    Staff Present Alberteen Sam, MA, RCEP, CCRP, CCET;Daimion Adamcik, RN, BSN, [employer], BS, ACSM CEP, Exercise Physiologist    Virtual Visit No    Medication changes reported     No    Warm-up and Cool-down Performed on first and last piece of equipment    Resistance Training Performed Yes    VAD Patient? No    PAD/SET Patient? No      Pain Assessment   Currently in Pain? No/denies              6 Minute Walk     Row Name 05/22/21 0940 10/21/21 0806       6 Minute Walk   Phase Initial Discharge    Distance 435 feet 300 feet    Distance % Change -- -31 %    Distance Feet Change -- -135 ft    Walk Time 5.25 minutes  Break 3:45-4:25 5.9 minutes    # of Rest Breaks $RemoveBef'1 1  10 'ChejNmDCZQ$ sec    MPH 0.94 0.58    METS 0.46 0.32    RPE 13 13    Perceived Dyspnea  2 2    VO2 Peak 1.63 1.13    Symptoms Yes (comment) Yes (comment)    Comments SOB SOB    Resting HR 62 bpm 82 bpm    Resting BP 154/74 140/72    Resting Oxygen Saturation  96 % 92 %    Exercise Oxygen Saturation  during 6 min walk 92 % 89 %    Max Ex. HR 74 bpm 94 bpm    Max Ex. BP 180/78 146/72    2 Minute Post BP 156/74 --                Social History   Tobacco Use  Smoking Status Never  Smokeless Tobacco Never    Goals Met:  Independence with exercise equipment Exercise tolerated well No report of concerns or symptoms today  Goals Unmet:  Not Applicable  Comments: Pt able to follow  exercise prescription today without complaint.  Will continue to monitor for progression.    Dr. Emily Filbert is Medical Director for [place].  Dr. Ottie Glazier is Medical Director for [facility] Pulmonary Rehabilitation.

## 2021-10-21 NOTE — Patient Instructions (Signed)
Discharge Patient Instructions  Patient Details  Name: Tiffany Alexander MRN: [id number] Date of Birth: 1945/04/12 Referring Provider:  [place] Pink, MD   Number of Visits: 23  Reason for Discharge:  Patient reached a stable level of exercise. Patient independent in their exercise.  Smoking History:  Social History   Tobacco Use  Smoking Status Never  Smokeless Tobacco Never    Diagnosis:  No diagnosis found.  Initial Exercise Prescription:  Initial Exercise Prescription - 05/22/21 1400       Date of Initial Exercise RX and Referring Provider   Date 05/22/21    Referring Provider Kathlyn Sacramento MD      Oxygen   Maintain Oxygen Saturation 88% or higher      Recumbant Bike   Level 1    RPM 60    Minutes 15    METs 1      NuStep   Level 1    SPM 80    Minutes 15    METs 1      Biostep-RELP   Level 1    SPM 50    Minutes 15    METs 1      Track   Laps 5   as tolerated   Minutes 15    METs 1.27      Prescription Details   Frequency (times per week) 2    Duration Progress to 30 minutes of continuous aerobic without signs/symptoms of physical distress      Intensity   THRR 40-80% of Max Heartrate 94 - 126    Ratings of Perceived Exertion 11-13    Perceived Dyspnea 0-4      Progression   Progression Continue to progress workloads to maintain intensity without signs/symptoms of physical distress.      Resistance Training   Training Prescription Yes    Weight 2 lb    Reps 10-15             Discharge Exercise Prescription (Final Exercise Prescription Changes):  Exercise Prescription Changes - 10/20/21 0800       Response to Exercise   Blood Pressure (Admit) 126/74    Blood Pressure (Exit) 124/64    Heart Rate (Admit) 75 bpm    Heart Rate (Exercise) 90 bpm    Heart Rate (Exit) 78 bpm    Rating of Perceived Exertion (Exercise) 12    Symptoms none    Duration Continue with 30 min of aerobic exercise without signs/symptoms of physical  distress.    Intensity THRR unchanged      Progression   Progression Continue to progress workloads to maintain intensity without signs/symptoms of physical distress.    Average METs 1.86      Resistance Training   Training Prescription Yes    Weight 0 lb    Reps --   did not do resistance training     Interval Training   Interval Training No      Recumbant Bike   Level 1    Watts 11    Minutes 30    METs 2.28      Track   Laps 5   Walked hallway   Minutes 15    METs 1.44      Home Exercise Plan   Plans to continue exercise at Home (comment)   at 1-2 days a week of home exercies (walking, stretching, stregthening) at home when not in rehab.   Frequency Add 2 additional days to program exercise sessions.  Initial Home Exercises Provided 06/12/21      Oxygen   Maintain Oxygen Saturation 88% or higher             Functional Capacity:  6 Minute Walk     Row Name 05/22/21 0940 10/21/21 0806       6 Minute Walk   Phase Initial Discharge    Distance 435 feet 300 feet    Distance % Change -- -31 %    Distance Feet Change -- -135 ft    Walk Time 5.25 minutes  Break 3:45-4:25 5.9 minutes    # of Rest Breaks '1 1  10 '$ sec    MPH 0.94 0.58    METS 0.46 0.32    RPE 13 13    Perceived Dyspnea  2 2    VO2 Peak 1.63 1.13    Symptoms Yes (comment) Yes (comment)    Comments SOB SOB    Resting HR 62 bpm 82 bpm    Resting BP 154/74 140/72    Resting Oxygen Saturation  96 % 92 %    Exercise Oxygen Saturation  during 6 min walk 92 % 89 %    Max Ex. HR 74 bpm 94 bpm    Max Ex. BP 180/78 146/72    2 Minute Post BP 156/74 --             Nutrition & Weight - Outcomes:  Pre Biometrics - 05/22/21 0939       Pre Biometrics   Height 5' 2.5" (1.588 m)    Weight 204 lb 11.2 oz (92.9 kg)    BMI (Calculated) 36.82    Single Leg Stand 0 seconds             Post Biometrics - 10/21/21 7628        Post  Biometrics   Height 5' 2.5" (1.588 m)    Weight 197 lb  14.4 oz (89.8 kg)    BMI (Calculated) 35.6    Single Leg Stand 0 seconds             Nutrition:  Nutrition Therapy & Goals - 07/07/21 1427       Nutrition Therapy   Diet Heart healthy, low Na, T2DM    Protein (specify units) 75g    Fiber 25 grams    Whole Grain Foods 3 servings    Saturated Fats 12 max. grams    Fruits and Vegetables 8 servings/day    Sodium 2 grams      Personal Nutrition Goals   Nutrition Goal ST: add at least 1 CHO serving to Breakfast and Lunch to avoid BG lows LT: Maintain A1C <7, follow MyPlate guidelines, eat consistent CHOs    Comments 76 y.o. F admitted to cardiac rehab s/p coronary artery placement. PMHx of asthma, HTN, T2DM, pulmonary fibrosis, coronary calcification, GERD, diverticulosis. Pt wears dentures. Relevant medications include vit C, Calcium w/ vit D, vit D3, vitamin B12, lasix, glipizide, lomotil, lansoprazole, MVI, zofran, probiotic, crestor, tramadol, trazodone. PYP Score: 58. Vegetables & Fruits 5/12. Breads, Grains & Cereals 7/12. Red & Processed Meat 4/12. Poultry 2/2. Fish & Shellfish 0/4. Beans, Nuts & Seeds 1/4. Milk & Dairy Foods 2/6. Toppings, Oils, Seasonings & Salt 13/20. Sweets, Snacks & Restaurant Food 14/14. Beverages 10/10. Coffee B: biscuit - not as much anymore, 2 eggs and toast (keto bread: 2 slices = 31D CHO, 6g fiber) L: will sometimes skip, sandwich (peanut butter or [place] - keto bread) or leftovers S:  she will have low sugar around here D:3-4pm: meat with 2 vegetables. Discussed general heart healthy eating as well as T2DM MNT.      Intervention Plan   Intervention Prescribe, educate and counsel regarding individualized specific dietary modifications aiming towards targeted core components such as weight, hypertension, lipid management, diabetes, heart failure and other comorbidities.    Expected Outcomes Short Term Goal: Understand basic principles of dietary content, such as calories, fat, sodium, cholesterol and  nutrients.;Short Term Goal: A plan has been developed with personal nutrition goals set during dietitian appointment.;Long Term Goal: Adherence to prescribed nutrition plan.            Goals reviewed with patient; copy given to patient.

## 2021-10-22 ENCOUNTER — Other Ambulatory Visit: Payer: Self-pay | Admitting: Cardiovascular Disease

## 2021-10-23 ENCOUNTER — Encounter: Payer: Medicare HMO | Admitting: *Deleted

## 2021-10-23 DIAGNOSIS — Z955 Presence of coronary angioplasty implant and graft: Secondary | ICD-10-CM | POA: Diagnosis not present

## 2021-10-23 NOTE — Progress Notes (Signed)
Cardiac Individual Treatment Plan  Patient Details  Name: Tiffany Alexander MRN: [id number] Date of Birth: November 19, 1945 Referring Provider:   Flowsheet Row Cardiac Rehab from 05/22/2021 in [facility] Cardiac and Pulmonary Rehab  Referring Provider Kathlyn Sacramento MD       Initial Encounter Date:  Flowsheet Row Cardiac Rehab from 05/22/2021 in [facility] Cardiac and Pulmonary Rehab  Date 05/22/21       Visit Diagnosis: Status post coronary artery stent placement  Patient's Home Medications on Admission:  Current Outpatient Medications:    albuterol (VENTOLIN HFA) 108 (90 Base) MCG/ACT inhaler, 2 puffs Q4H PRN, Disp: 18 g, Rfl: 0   amLODipine (NORVASC) 10 MG tablet, Take 10 mg by mouth at bedtime., Disp: , Rfl:    ascorbic acid (VITAMIN C) 500 MG tablet, Take 500 mg by mouth daily., Disp: , Rfl:    aspirin 81 MG chewable tablet, Chew 1 tablet (81 mg total) by mouth daily., Disp: 90 tablet, Rfl: 0   budesonide (PULMICORT) 0.25 MG/2ML nebulizer solution, Take 2 mLs (0.25 mg total) by nebulization 2 (two) times daily., Disp: 120 mL, Rfl: 6   calcium citrate-vitamin D (CITRACAL+D) 315-200 MG-UNIT tablet, Take 1 tablet by mouth 2 (two) times daily., Disp: , Rfl:    carvedilol (COREG) 25 MG tablet, Take 1 tablet (25 mg total) by mouth 2 (two) times daily., Disp: 180 tablet, Rfl: 0   cephALEXin (KEFLEX) 250 MG capsule, Take 250 mg by mouth daily. As needed for UTI, Disp: , Rfl:    Cholecalciferol (VITAMIN D3) 1000 units CAPS, Take by mouth daily., Disp: , Rfl:    citalopram (CELEXA) 20 MG tablet, Take by mouth., Disp: , Rfl:    clopidogrel (PLAVIX) 75 MG tablet, Take 1 tablet (75 mg total) by mouth daily with breakfast., Disp: 90 tablet, Rfl: 3   Cyanocobalamin (VITAMIN B 12 PO), Take 1 tablet by mouth daily., Disp: , Rfl:    diphenhydrAMINE (BENADRYL) 25 MG tablet, Take 75 mg by mouth daily as needed for allergies., Disp: , Rfl:    diphenoxylate-atropine (LOMOTIL) 2.5-0.025 MG tablet, Take 1 tablet by  mouth 2 (two) times daily as needed for diarrhea or loose stools., Disp: , Rfl:    fluticasone (FLONASE) 50 MCG/ACT nasal spray, Place 2 sprays into both nostrils 2 (two) times daily., Disp: , Rfl:    formoterol (PERFOROMIST) 20 MCG/2ML nebulizer solution, USE ONE VIAL VIA NEBULIZER AND INHALE BY MOUTH TWICE DAILY, Disp: 120 mL, Rfl: 11   furosemide (LASIX) 20 MG tablet, Take 20 mg by mouth daily., Disp: , Rfl:    glipiZIDE (GLUCOTROL XL) 5 MG 24 hr tablet, Take 5 mg by mouth daily with breakfast., Disp: , Rfl:    hydrALAZINE (APRESOLINE) 50 MG tablet, TAKE 1 TABLET BY MOUTH 3 TIMES DAILY, Disp: 90 tablet, Rfl: 0   hydrOXYzine (ATARAX/VISTARIL) 10 MG tablet, Take 10 mg by mouth every 8 (eight) hours as needed for itching., Disp: , Rfl:    Lansoprazole (PREVACID PO), Take by mouth., Disp: , Rfl:    losartan (COZAAR) 100 MG tablet, Take 100 mg by mouth at bedtime., Disp: , Rfl:    Melatonin 10 MG CAPS, Take 20 mg by mouth at bedtime., Disp: , Rfl:    Multiple Vitamins-Minerals (WOMENS MULTIVITAMIN PO), Take 1 tablet by mouth daily., Disp: , Rfl:    ondansetron (ZOFRAN-ODT) 4 MG disintegrating tablet, Take 1 tablet (4 mg total) by mouth every 8 (eight) hours as needed for nausea or vomiting., Disp: 20 tablet,  Rfl: 0   Probiotic Product (PROBIOTIC BLEND PO), Take 1 capsule by mouth daily., Disp: , Rfl:    rosuvastatin (CRESTOR) 20 MG tablet, Take 1 tablet (20 mg total) by mouth at bedtime., Disp: 30 tablet, Rfl: 5   traMADol (ULTRAM) 50 MG tablet, Take 50 mg by mouth 3 (three) times daily as needed for moderate pain., Disp: , Rfl:    traZODone (DESYREL) 150 MG tablet, Take 150 mg by mouth at bedtime., Disp: , Rfl:  No current facility-administered medications for this visit.  Facility-Administered Medications Ordered in Other Visits:    albuterol (PROVENTIL) (2.5 MG/3ML) 0.083% nebulizer solution 2.5 mg, 2.5 mg, Nebulization, Once, Wilhelmina Mcardle, MD  Past Medical History: Past Medical History:   Diagnosis Date   Arthritis    osteoarthritis   Carpal tunnel syndrome of left wrist    Depression    Diabetes mellitus without complication (HCC)    Diverticulosis    GERD (gastroesophageal reflux disease)    Hypertension    PONV (postoperative nausea and vomiting)    PONV (postoperative nausea and vomiting)    Pulmonary fibrosis (HCC)    Recurrent UTI    SUI (stress urinary incontinence, female)    Wears dentures    full upper and lower    Tobacco Use: Social History   Tobacco Use  Smoking Status Never  Smokeless Tobacco Never    Labs: Review Flowsheet        No data to display           Exercise Target Goals: Exercise Program Goal: Individual exercise prescription set using results from initial 6 min walk test and THRR while considering  patient's activity barriers and safety.   Exercise Prescription Goal: Initial exercise prescription builds to 30-45 minutes a day of aerobic activity, 2-3 days per week.  Home exercise guidelines will be given to patient during program as part of exercise prescription that the participant will acknowledge.   Education: Aerobic Exercise: - Group verbal and visual presentation on the components of exercise prescription. Introduces F.I.T.T principle from ACSM for exercise prescriptions.  Reviews F.I.T.T. principles of aerobic exercise including progression. Written material given at graduation. Flowsheet Row Cardiac Rehab from 05/22/2021 in [facility] Cardiac and Pulmonary Rehab  Education need identified 05/22/21       Education: Resistance Exercise: - Group verbal and visual presentation on the components of exercise prescription. Introduces F.I.T.T principle from ACSM for exercise prescriptions  Reviews F.I.T.T. principles of resistance exercise including progression. Written material given at graduation.    Education: Exercise & Equipment Safety: - Individual verbal instruction and demonstration of equipment use and safety with  use of the equipment. Flowsheet Row Cardiac Rehab from 05/22/2021 in [facility] Cardiac and Pulmonary Rehab  Education need identified 05/22/21  Date 05/22/21  Educator [place]  Instruction Review Code 1- Verbalizes Understanding       Education: Exercise Physiology & General Exercise Guidelines: - Group verbal and written instruction with models to review the exercise physiology of the cardiovascular system and associated critical values. Provides general exercise guidelines with specific guidelines to those with heart or lung disease.  Flowsheet Row Cardiac Rehab from 05/22/2021 in [facility] Cardiac and Pulmonary Rehab  Education need identified 05/22/21       Education: Flexibility, Balance, Mind/Body Relaxation: - Group verbal and visual presentation with interactive activity on the components of exercise prescription. Introduces F.I.T.T principle from ACSM for exercise prescriptions. Reviews F.I.T.T. principles of flexibility and balance exercise training including progression. Also  discusses the mind body connection.  Reviews various relaxation techniques to help reduce and manage stress (i.e. Deep breathing, progressive muscle relaxation, and visualization). Balance handout provided to take home. Written material given at graduation.   Activity Barriers & Risk Stratification:  Activity Barriers & Cardiac Risk Stratification - 05/22/21 0939       Activity Barriers & Cardiac Risk Stratification   Activity Barriers Balance Concerns;Arthritis;Joint Problems;Back Problems;Deconditioning;Muscular [employer] Device;Shortness of Breath    Cardiac Risk Stratification Moderate             6 Minute Walk:  6 Minute Walk     Row Name 05/22/21 0940 10/21/21 0806       6 Minute Walk   Phase Initial Discharge    Distance 435 feet 300 feet    Distance % Change -- -31 %    Distance Feet Change -- -135 ft    Walk Time 5.25 minutes  Break 3:45-4:25 5.9 minutes    # of Rest Breaks _0 sec     MPH 0.94 0.58    METS 0.46 0.32    RPE 13 13    Perceived Dyspnea  2 2    VO2 Peak 1.63 1.13    Symptoms Yes (comment) Yes (comment)    Comments SOB SOB    Resting HR 62 bpm 82 bpm    Resting BP 154/74 140/72    Resting Oxygen Saturation  96 % 92 %    Exercise Oxygen Saturation  during 6 min walk 92 % 89 %    Max Ex. HR 74 bpm 94 bpm    Max Ex. BP 180/78 146/72    2 Minute Post BP 156/74 --             Oxygen Initial Assessment:   Oxygen Re-Evaluation:   Oxygen Discharge (Final Oxygen Re-Evaluation):   Initial Exercise Prescription:  Initial Exercise Prescription - 05/22/21 1400       Date of Initial Exercise RX and Referring Provider   Date 05/22/21    Referring Provider Kathlyn Sacramento MD      Oxygen   Maintain Oxygen Saturation 88% or higher      Recumbant Bike   Level 1    RPM 60    Minutes 15    METs 1      NuStep   Level 1    SPM 80    Minutes 15    METs 1      Biostep-RELP   Level 1    SPM 50    Minutes 15    METs 1      Track   Laps 5   as tolerated   Minutes 15    METs 1.27      Prescription Details   Frequency (times per week) 2    Duration Progress to 30 minutes of continuous aerobic without signs/symptoms of physical distress      Intensity   THRR 40-80% of Max Heartrate 94 - 126    Ratings of Perceived Exertion 11-13    Perceived Dyspnea 0-4      Progression   Progression Continue to progress workloads to maintain intensity without signs/symptoms of physical distress.      Resistance Training   Training Prescription Yes    Weight 2 lb    Reps 10-15             Perform Capillary Blood Glucose checks as needed.  Exercise Prescription Changes:   Exercise  Prescription Changes     Row Name 05/22/21 1400 06/02/21 1100 06/12/21 0800 07/01/21 1600 07/28/21 1600     Response to Exercise   Blood Pressure (Admit) 154/74 110/58 -- 138/68 122/82   Blood Pressure (Exercise) 180/78 132/70 -- 142/64 --   Blood Pressure  (Exit) 156/74 124/62 -- 120/62 124/70   Heart Rate (Admit) 62 bpm 66 bpm -- 55 bpm 60 bpm   Heart Rate (Exercise) 74 bpm 75 bpm -- 86 bpm 73 bpm   Heart Rate (Exit) 66 bpm 65 bpm -- 72 bpm 56 bpm   Oxygen Saturation (Admit) 96 % -- -- -- --   Oxygen Saturation (Exercise) 92 % -- -- -- --   Oxygen Saturation (Exit) 95 % -- -- -- --   Rating of Perceived Exertion (Exercise) 13 14 -- 12 12   Perceived Dyspnea (Exercise) 2 -- -- -- --   Symptoms SOB none -- fatigue fatigue   Comments walk test results 1st full day of exercise -- -- --   Duration -- Progress to 30 minutes of  aerobic without signs/symptoms of physical distress Progress to 30 minutes of  aerobic without signs/symptoms of physical distress Progress to 30 minutes of  aerobic without signs/symptoms of physical distress Progress to 30 minutes of  aerobic without signs/symptoms of physical distress   Intensity -- THRR unchanged THRR unchanged THRR unchanged THRR unchanged     Progression   Progression -- Continue to progress workloads to maintain intensity without signs/symptoms of physical distress. Continue to progress workloads to maintain intensity without signs/symptoms of physical distress. Continue to progress workloads to maintain intensity without signs/symptoms of physical distress. Continue to progress workloads to maintain intensity without signs/symptoms of physical distress.   Average METs -- 1.1 1.1 1.95 1.88     Resistance Training   Training Prescription -- Yes Yes Yes Yes   Weight -- 2 lb 2 lb 2 lb 2 lb   Reps -- 10-15 10-15 10-15 10-15     Interval Training   Interval Training -- No No No No     Recumbant Bike   Level -- -- -- 1 1   Watts -- -- -- -- 8   Minutes -- -- -- 15 15   METs -- -- -- 3.1 3     NuStep   Level -- _0 Minutes -- _1 METs -- 1.1 1.1 1.6 2     Biostep-RELP   Level -- 1 1 -- --   Minutes -- 15 15 -- --   METs -- 1 1 -- --     Track   Laps -- -- -- -- 3   Minutes  -- -- -- -- 15   METs -- -- -- -- 1.3     Home Exercise Plan   Plans to continue exercise at -- -- Home (comment)  at 1-2 days a week of home exercies (walking, stretching, stregthening) at home when not in rehab. Home (comment)  at 1-2 days a week of home exercies (walking, stretching, stregthening) at home when not in rehab. Home (comment)  at 1-2 days a week of home exercies (walking, stretching, stregthening) at home when not in rehab.   Frequency -- -- Add 2 additional days to program exercise sessions. Add 2 additional days to program exercise sessions. Add 2 additional days to program exercise sessions.   Initial Home Exercises Provided -- -- 06/12/21 06/12/21 06/12/21  Oxygen   Maintain Oxygen Saturation -- 88% or higher 88% or higher 88% or higher 88% or higher    Row Name 08/11/21 0900 08/25/21 1400 09/08/21 0800 09/22/21 0700 10/06/21 0800     Response to Exercise   Blood Pressure (Admit) 122/64 128/76 126/74 132/70 112/62   Blood Pressure (Exit) 120/70 118/62 142/70 128/72 104/62   Heart Rate (Admit) 60 bpm 62 bpm 65 bpm 66 bpm 73 bpm   Heart Rate (Exercise) 85 bpm 69 bpm 77 bpm 83 bpm 98 bpm   Heart Rate (Exit) 61 bpm 58 bpm 74 bpm 70 bpm 66 bpm   Rating of Perceived Exertion (Exercise) _0 Symptoms _1    Duration Continue with 30 min of aerobic exercise without signs/symptoms of physical distress. Continue with 30 min of aerobic exercise without signs/symptoms of physical distress. Continue with 30 min of aerobic exercise without signs/symptoms of physical distress. Continue with 30 min of aerobic exercise without signs/symptoms of physical distress. Continue with 30 min of aerobic exercise without signs/symptoms of physical distress.   Intensity _2      Progression   Progression Continue to progress workloads to maintain intensity without signs/symptoms of physical  distress. Continue to progress workloads to maintain intensity without signs/symptoms of physical distress. Continue to progress workloads to maintain intensity without signs/symptoms of physical distress. Continue to progress workloads to maintain intensity without signs/symptoms of physical distress. Continue to progress workloads to maintain intensity without signs/symptoms of physical distress.   Average METs 1.78 1.72 2.19 2.22 2.21     Resistance Training   Training Prescription _3    Weight 2 lb 2 lb 2 lb 2 lb 0 lb   Reps 10-15 10-15 10-15 10-15 --  did not do resistance training     Interval Training   Interval Training _4      Recumbant Bike   Level _5 Watts _6 Minutes _7 METs 2.31 2.31 2.31 2.42 2.41     NuStep   Level _8 Minutes _9 METs 15 1.7 2 1.6 2.1     Biostep-RELP   Level -- 1 -- -- --   Minutes -- 15 -- -- --   METs -- 1 -- -- --     Track   Laps -- 5 -- -- --   Minutes -- 15 -- -- --   METs -- 1.27 -- -- --     Home Exercise Plan   Plans to continue exercise at Home (comment)  at 1-2 days a week of home exercies (walking, stretching, stregthening) at home when not in rehab. Home (comment)  at 1-2 days a week of home exercies (walking, stretching, stregthening) at home when not in rehab. Home (comment)  at 1-2 days a week of home exercies (walking, stretching, stregthening) at home when not in rehab. Home (comment)  at 1-2 days a week of home exercies (walking, stretching, stregthening) at home when not in rehab. Home (comment)  at 1-2 days a week of home exercies (walking, stretching, stregthening) at home when not in rehab.   Frequency Add 2 additional days to program exercise sessions. Add 2 additional days to program exercise sessions. Add 2 additional days to  program exercise sessions. Add 2 additional days to program exercise sessions. Add 2 additional days to program  exercise sessions.   Initial Home Exercises Provided 06/12/21 06/12/21 06/12/21 06/12/21 06/12/21     Oxygen   Maintain Oxygen Saturation 88% or higher 88% or higher 88% or higher 88% or higher 88% or higher    Row Name 10/20/21 0800             Response to Exercise   Blood Pressure (Admit) 126/74       Blood Pressure (Exit) 124/64       Heart Rate (Admit) 75 bpm       Heart Rate (Exercise) 90 bpm       Heart Rate (Exit) 78 bpm       Rating of Perceived Exertion (Exercise) 12       Symptoms none       Duration Continue with 30 min of aerobic exercise without signs/symptoms of physical distress.       Intensity THRR unchanged         Progression   Progression Continue to progress workloads to maintain intensity without signs/symptoms of physical distress.       Average METs 1.86         Resistance Training   Training Prescription Yes       Weight 0 lb       Reps --  did not do resistance training         Interval Training   Interval Training No         Recumbant Bike   Level 1       Watts 11       Minutes 30       METs 2.28         Track   Laps 5  Walked hallway       Minutes 15       METs 1.44         Home Exercise Plan   Plans to continue exercise at Home (comment)  at 1-2 days a week of home exercies (walking, stretching, stregthening) at home when not in rehab.       Frequency Add 2 additional days to program exercise sessions.       Initial Home Exercises Provided 06/12/21         Oxygen   Maintain Oxygen Saturation 88% or higher                Exercise Comments:   Exercise Comments     Row Name 05/27/21 0756 10/23/21 0744         Exercise Comments First full day of exercise!  Patient was oriented to gym and equipment including functions, settings, policies, and procedures.  Patient's individual exercise prescription and treatment plan were reviewed.  All starting workloads were established based on the results of the 6 minute walk test done at  initial orientation visit.  The plan for exercise progression was also introduced and progression will be customized based on patient's performance and goals. Ita graduated today from  rehab with 31 sessions completed.  Details of the patient's exercise prescription and what She needs to do in order to continue the prescription and progress were discussed with patient.  Patient was given a copy of prescription and goals.  Patient verbalized understanding.  Tytiana plans to continue to exercise by getting an exercise bike at home for use.  Exercise Goals and Review:   Exercise Goals     Row Name 05/22/21 1422             Exercise Goals   Increase Physical Activity Yes       Intervention Provide advice, education, support and counseling about physical activity/exercise needs.;Develop an individualized exercise prescription for aerobic and resistive training based on initial evaluation findings, risk stratification, comorbidities and participant's personal goals.       Expected Outcomes Short Term: Attend rehab on a regular basis to increase amount of physical activity.;Long Term: Add in home exercise to make exercise part of routine and to increase amount of physical activity.;Long Term: Exercising regularly at least 3-5 days a week.       Increase Strength and Stamina Yes       Intervention Provide advice, education, support and counseling about physical activity/exercise needs.;Develop an individualized exercise prescription for aerobic and resistive training based on initial evaluation findings, risk stratification, comorbidities and participant's personal goals.       Expected Outcomes Short Term: Increase workloads from initial exercise prescription for resistance, speed, and METs.;Short Term: Perform resistance training exercises routinely during rehab and add in resistance training at home;Long Term: Improve cardiorespiratory fitness, muscular endurance and strength as  measured by increased METs and functional capacity (6MWT)       Able to understand and use rate of perceived exertion (RPE) scale Yes       Intervention Provide education and explanation on how to use RPE scale       Expected Outcomes Short Term: Able to use RPE daily in rehab to express subjective intensity level;Long Term:  Able to use RPE to guide intensity level when exercising independently       Able to understand and use Dyspnea scale Yes       Intervention Provide education and explanation on how to use Dyspnea scale       Expected Outcomes Short Term: Able to use Dyspnea scale daily in rehab to express subjective sense of shortness of breath during exertion;Long Term: Able to use Dyspnea scale to guide intensity level when exercising independently       Knowledge and understanding of Target Heart Rate Range (THRR) Yes       Intervention Provide education and explanation of THRR including how the numbers were predicted and where they are located for reference       Expected Outcomes Short Term: Able to state/look up THRR;Long Term: Able to use THRR to govern intensity when exercising independently;Short Term: Able to use daily as guideline for intensity in rehab       Able to check pulse independently Yes       Intervention Provide education and demonstration on how to check pulse in carotid and radial arteries.;Review the importance of being able to check your own pulse for safety during independent exercise       Expected Outcomes Short Term: Able to explain why pulse checking is important during independent exercise;Long Term: Able to check pulse independently and accurately       Understanding of Exercise Prescription Yes       Intervention Provide education, explanation, and written materials on patient's individual exercise prescription       Expected Outcomes Short Term: Able to explain program exercise prescription;Long Term: Able to explain home exercise prescription to exercise  independently                Exercise Goals Re-Evaluation :  Exercise Goals Re-Evaluation     Row Name 05/27/21 0756 06/02/21 1117 06/12/21 0824 07/01/21 1612 07/10/21 0811     Exercise Goal Re-Evaluation   Exercise Goals Review Increase Physical Activity;Able to understand and use rate of perceived exertion (RPE) scale;Knowledge and understanding of Target Heart Rate Range (THRR);Understanding of Exercise Prescription;Able to understand and use Dyspnea scale;Able to check pulse independently;Increase Strength and Stamina Increase Physical Activity;Increase Strength and Stamina Increase Physical Activity;Increase Strength and Stamina;Able to understand and use rate of perceived exertion (RPE) scale;Able to understand and use Dyspnea scale;Knowledge and understanding of Target Heart Rate Range (THRR);Able to check pulse independently;Understanding of Exercise Prescription Increase Physical Activity;Increase Strength and Stamina;Understanding of Exercise Prescription Increase Physical Activity;Increase Strength and Stamina;Understanding of Exercise Prescription   Comments Reviewed RPE and dyspnea scales, THR and program prescription with pt today.  Pt voiced understanding and was given a copy of goals to take home. Brittlyn completed her first session of rehab and tolerated it well. RPEs were in good range. We will continue to monitor as she progresses throughout the program. Reviewed home exercise with pt today.  Pt plans to walk for exercise.  Reviewed THR, pulse, RPE, sign and symptoms, pulse oximetery and when to call 911 or MD.  Also discussed weather considerations and indoor options.  Pt voiced understanding. Jayleene returned today after being out for two weeks.  She was able to return to her current workloads without a problem.  We will continue to monitor her progress. Eyonna reports doing no exercise at home right now, according to home exercise note, Breyah reported she will walk for exercise - she does  not remember this. She reports outside is too bumpy to walk, but she can walk inside. She reports not being able to go to a gym for exercise while at rehab because her family doesn't like her driving. She reports not having access to youtube for our exercise videos, but she does still have the exerciuse packet. She reports that her arms are weak and she got a cortisone shot which did not help - discussed how this can alleviate pain, but wont build muscle; discussed importance of resistance exercise.   Expected Outcomes Short: Use RPE daily to regulate intensity. Long: Follow program prescription in THR. Short: Continue to exercise at current exercise prescription Long: Increase overall MET level Short: add 1-2 days of exercise at home on off days of rehab. Long: become independent with exercise program. Short: Return to regular attendance Long; Conintue to improve stamina. ST: Return to regular attendance, exercidse outside of rehab  LT; Conintue to improve stamina.    [place] Name 07/28/21 1558 07/31/21 0827 08/11/21 0932 08/25/21 1423 09/04/21 0815     Exercise Goal Re-Evaluation   Exercise Goals Review Increase Physical Activity;Increase Strength and Stamina;Understanding of Exercise Prescription Increase Physical Activity;Increase Strength and Stamina;Understanding of Exercise Prescription Increase Physical Activity;Increase Strength and Stamina;Understanding of Exercise Prescription Increase Physical Activity;Increase Strength and Stamina;Understanding of Exercise Prescription Increase Physical Activity;Increase Strength and Stamina;Understanding of Exercise Prescription   Comments Miesha continues to have intermitten attendance due to transportation issues with her husband and his appointments.  We have encouraged her to try to attend reguarly to get the most out of the program, but she has been averaging about once a week.  She is able to walk some with her wheelchair around the track and has started to walk  down to rehab pushing the chair.  We will continue to montior her progress. Brailynn is not  exercising at home right now - discussed walking at home as outside is too bumpy and ustilizing the exercise packet given by the EP during home exercise. We discussed utilizing household itema such as water bottles and canned items that can fit in her hand comfortably. EP spoke with Jenissa about purchasing well made, cheap weights or resistance bands at [employer], [place], [place], [place] told her she could give her some resistance band handouts and show her how to use them. Kaitland has been coming when she is able to with transportation. She does not walk at rehab but has started to push her wheelchair down to rehab which is good for her as she was not able to do that before. She continues to work at level 1 on the recumbant bike and hope to see that increase to level 2. We will continue to monitor. Estefanny continues to do well in rehab. She was able to walk 5 laps on the track with a couple breaks in between. She also increased to level 3 on the T4 [place]. She should attempt to increase to level 2 on the recumbant bike. Will continue to monitor. Kimbra is is still walking at home, but not outside. She has a walker at home. Encouraged her to ustilize the exercise packet given by the EP during home exercise as well as buying some cheap weights for resistance exercise at home.   Expected Outcomes Short: Aim for regular attendance in rehab Long; Conitnue to walk more Short: Aim for regular attendance in rehab, purchase resistance bands and/or weights Long: Conitnue to walk more Short: Continue to push wheelchair down to incorporate walking Long: Increase overall MET level Short: Increase level on RB Long: Continue to build up overall strength and stamina Short: begin structured exercise outside of rehab  Long: Continue to build up overall strength and stamina    [place] Name 09/08/21 0807 09/18/21 0810 09/22/21 0802 10/06/21 0824 10/20/21  0814     Exercise Goal Re-Evaluation   Exercise Goals Review Increase Physical Activity;Increase Strength and Stamina;Understanding of Exercise Prescription Increase Physical Activity;Increase Strength and Stamina;Understanding of Exercise Prescription Increase Physical Activity;Increase Strength and Stamina;Understanding of Exercise Prescription Increase Physical Activity;Increase Strength and Stamina;Understanding of Exercise Prescription Increase Physical Activity;Increase Strength and Stamina;Understanding of Exercise Prescription   Comments Giannah is doing well in rehab. She improved her average overall METs to 2.19 METs. She has also tolerated 2 lb hand weights and may benefit from trying 3 lb for resistance training. Urvi has tolerated level 1 on the recumbent bike as well and could benefit from trying level 2. We will continue to monitor her progress in the program. Marieanne continues doing well in rehab. She has ordered a stationary bike for home which has not arrived yet. She is excited to use it. She continues to walk in the house for the meantime. Renika is doing well in rehab. She is up to level 4 on the T4. She also improved her overall average MET level to 2.22 METs. Deann has tolerated level 1 on the recumbent bike and may benefit from trying level 2. We will continue to monitor her progress in the program. Sabine is doing well in rehab. She got up to 2.1 METs on the T4. She also was able to stay at 2.41 METs for the entire 30 minutes on the recumbent bike. She will look to improve on her post 6MWT soon. We will continue to monitor her progress in the program. Chermaine is doing well  in rehab. She has been able to do the recumbent bike at level 1 for up to 30 minutes. She has also consistently kept her MET levels above 2 METs. She has not been able to participate in resistance training since the last review. We will continue to monitor her progress in the program.   Expected Outcomes Short: try level 2 on the  recumbent bike. Long: Continue to improve overall MET level. ST: start structured exercise at home at least 2x/week. LT: exercise at least 150 minutes of moderate aerobic activity Short: Try level 2 on the Recumbent Bike. Long: Continue to improve overall MET levels. Short: Complete post 6MWT. Long: Continue to increase strength and stamina. Short: Complete post 6MWT. Long: Continue to increase strength and stamina.    [place] Name 10/21/21 0750             Exercise Goal Re-Evaluation   Exercise Goals Review Increase Physical Activity;Increase Strength and Stamina;Understanding of Exercise Prescription       Comments Emalia has done well in rehab.  She has decided to graduate early for her husband's schedule.  She is planning to graduate on her next visit and plans to walk at home along with stretching.  She did not improve her walk test as she has not been walking as much at home with the heat recently.       Expected Outcomes Continue to exercise independently                Discharge Exercise Prescription (Final Exercise Prescription Changes):  Exercise Prescription Changes - 10/20/21 0800       Response to Exercise   Blood Pressure (Admit) 126/74    Blood Pressure (Exit) 124/64    Heart Rate (Admit) 75 bpm    Heart Rate (Exercise) 90 bpm    Heart Rate (Exit) 78 bpm    Rating of Perceived Exertion (Exercise) 12    Symptoms none    Duration Continue with 30 min of aerobic exercise without signs/symptoms of physical distress.    Intensity THRR unchanged      Progression   Progression Continue to progress workloads to maintain intensity without signs/symptoms of physical distress.    Average METs 1.86      Resistance Training   Training Prescription Yes    Weight 0 lb    Reps --   did not do resistance training     Interval Training   Interval Training No      Recumbant Bike   Level 1    Watts 11    Minutes 30    METs 2.28      Track   Laps 5   Walked hallway   Minutes  15    METs 1.44      Home Exercise Plan   Plans to continue exercise at Home (comment)   at 1-2 days a week of home exercies (walking, stretching, stregthening) at home when not in rehab.   Frequency Add 2 additional days to program exercise sessions.    Initial Home Exercises Provided 06/12/21      Oxygen   Maintain Oxygen Saturation 88% or higher             Nutrition:  Target Goals: Understanding of nutrition guidelines, daily intake of sodium <1580m, cholesterol <2022m calories 30% from fat and 7% or less from saturated fats, daily to have 5 or more servings of fruits and vegetables.  Education: All About Nutrition: -Group instruction provided by verbal,  written material, interactive activities, discussions, models, and posters to present general guidelines for heart healthy nutrition including fat, fiber, MyPlate, the role of sodium in heart healthy nutrition, utilization of the nutrition label, and utilization of this knowledge for meal planning. Follow up email sent as well. Written material given at graduation. Flowsheet Row Cardiac Rehab from 05/22/2021 in [facility]  Education need identified 05/22/21       Biometrics:  Pre Biometrics - 05/22/21 0939       Pre Biometrics   Height 5' 2.5" (1.588 m)    Weight 204 lb 11.2 oz (92.9 kg)    BMI (Calculated) 36.82    Single Leg Stand 0 seconds             Post Biometrics - 10/21/21 3846        Post  Biometrics   Height 5' 2.5" (1.588 m)    Weight 197 lb 14.4 oz (89.8 kg)    BMI (Calculated) 35.6    Single Leg Stand 0 seconds             Nutrition Therapy Plan and Nutrition Goals:  Nutrition Therapy & Goals - 07/07/21 1427       Nutrition Therapy   Diet Heart healthy, low Na, T2DM    Protein (specify units) 75g    Fiber 25 grams    Whole Grain Foods 3 servings    Saturated Fats 12 max. grams    Fruits and Vegetables 8 servings/day    Sodium 2 grams      Personal Nutrition  Goals   Nutrition Goal ST: add at least 1 CHO serving to Breakfast and Lunch to avoid BG lows LT: Maintain A1C <7, follow MyPlate guidelines, eat consistent CHOs    Comments 76 y.o. F admitted to cardiac rehab s/p coronary artery placement. PMHx of asthma, HTN, T2DM, pulmonary fibrosis, coronary calcification, GERD, diverticulosis. Pt wears dentures. Relevant medications include vit C, Calcium w/ vit D, vit D3, vitamin B12, lasix, glipizide, lomotil, lansoprazole, MVI, zofran, probiotic, crestor, tramadol, trazodone. PYP Score: 58. Vegetables & Fruits 5/12. Breads, Grains & Cereals 7/12. Red & Processed Meat 4/12. Poultry 2/2. Fish & Shellfish 0/4. Beans, Nuts & Seeds 1/4. Milk & Dairy Foods 2/6. Toppings, Oils, Seasonings & Salt 13/20. Sweets, Snacks & Restaurant Food 14/14. Beverages 10/10. Coffee B: biscuit - not as much anymore, 2 eggs and toast (keto bread: 2 slices = 65L CHO, 6g fiber) L: will sometimes skip, sandwich (peanut butter or [place] - keto bread) or leftovers S: she will have low sugar around here D:3-4pm: meat with 2 vegetables. Discussed general heart healthy eating as well as T2DM MNT.      Intervention Plan   Intervention Prescribe, educate and counsel regarding individualized specific dietary modifications aiming towards targeted core components such as weight, hypertension, lipid management, diabetes, heart failure and other comorbidities.    Expected Outcomes Short Term Goal: Understand basic principles of dietary content, such as calories, fat, sodium, cholesterol and nutrients.;Short Term Goal: A plan has been developed with personal nutrition goals set during dietitian appointment.;Long Term Goal: Adherence to prescribed nutrition plan.             Nutrition Assessments:  MEDIFICTS Score Key: ?70 Need to make dietary changes  40-70 Heart Healthy Diet ? 40 Therapeutic Level Cholesterol Diet  Flowsheet Row Cardiac Rehab from 10/23/2021 in [facility]   Picture Your Plate Total Score on Admission 58  Picture Your  Plate Total Score on Discharge 68      Picture Your Plate Scores: <34 Unhealthy dietary pattern with much room for improvement. 41-50 Dietary pattern unlikely to meet recommendations for good health and room for improvement. 51-60 More healthful dietary pattern, with some room for improvement.  >60 Healthy dietary pattern, although there may be some specific behaviors that could be improved.    Nutrition Goals Re-Evaluation:  Nutrition Goals Re-Evaluation     Row Name 06/12/21 0759 07/31/21 0814 09/04/21 0801 09/18/21 0804 10/21/21 0759     Goals   Nutrition Goal -- ST: Continue to make sure meals and snacks have protein, fiber, and fat. Make sure meals have at least 2-3 CHO servings and snacks 1-2. LT: Maintain A1C <7, follow MyPlate guidelines, eat consistent CHOs ST: Practice MyPlate structure. LT: Maintain A1C <7, follow MyPlate guidelines, eat consistent CHOs ST: Practice MyPlate structure. LT: Maintain A1C <7, follow MyPlate guidelines, eat consistent CHOs ST: Practice MyPlate structure. LT: Maintain A1C <7, follow MyPlate guidelines, eat consistent CHOs   Comment Patient has not yet met with program dietician to set nutrition goals. She has a scheduled appointment on June 26, 2021. She reports liking cooking and eating. She has optifast bar as one snack - 4g fiber, 17g CHO, protein 16g. She got a new whole grain bread and eats more vegetables; she is practicing MyPlate structure. She would like to continue with her current changes so far. We discussed some other healthy snacks such as fruit with nuts/seeds - she enjoys fruit, but it runs her BG up; discussed pairing CHO rich foods with protein and fat. She is baking chicken with vinegar - her grandchildren calls it vinegar chicken. Ravinder reports that her BG has been high because she went to [facility] and she reports not eating like she was supposed to like having coconut cake  and then brownies when she got home because her grandson made them. She is enjoying her whole grain bread thats he bought and is continuing to eat more vegetables. She continues to practice MyPlate structure. Discussed practicing MyPlate structure even when she doesn't cook it herself like at church and at the 4th of July BBQ coming up. Blanche reports getting back on track with her nutrition after vacation. She continues to enjoy her whole grain bread thats he bought and is continuing to eat more vegetables. She continues to practice MyPlate structure. She even was mindful about balance on her plate on July 4th. Continues to work on diet.  She was encouraged to follow heart healthy diet guidelines.   Expected Outcome Short: Meet with program dietician to set nutrition goals. Long: Work towards diet changes given by Tourist information centre manager. ST: Continue to make sure meals and snacks have protein, fiber, and fat. Make sure meals have at least 2-3 CHO servings and snacks 1-2. LT: Maintain A1C <7, follow MyPlate guidelines, eat consistent CHOs ST: Practice MyPlate structure. LT: Maintain A1C <7, follow MyPlate guidelines, eat consistent CHOs ST: Practice MyPlate structure. LT: Maintain A1C <7, follow MyPlate guidelines, eat consistent CHOs ST: Practice MyPlate structure. LT: Maintain A1C <7, follow MyPlate guidelines, eat consistent CHOs            Nutrition Goals Discharge (Final Nutrition Goals Re-Evaluation):  Nutrition Goals Re-Evaluation - 10/21/21 0759       Goals   Nutrition Goal ST: Practice MyPlate structure. LT: Maintain A1C <7, follow MyPlate guidelines, eat consistent CHOs    Comment Continues to work on diet.  She was  encouraged to follow heart healthy diet guidelines.    Expected Outcome ST: Practice MyPlate structure. LT: Maintain A1C <7, follow MyPlate guidelines, eat consistent CHOs             Psychosocial: Target Goals: Acknowledge presence or absence of significant depression and/or  stress, maximize coping skills, provide positive support system. Participant is able to verbalize types and ability to use techniques and skills needed for reducing stress and depression.   Education: Stress, Anxiety, and Depression - Group verbal and visual presentation to define topics covered.  Reviews how body is impacted by stress, anxiety, and depression.  Also discusses healthy ways to reduce stress and to treat/manage anxiety and depression.  Written material given at graduation.   Education: Sleep Hygiene -Provides group verbal and written instruction about how sleep can affect your health.  Define sleep hygiene, discuss sleep cycles and impact of sleep habits. Review good sleep hygiene tips.    Initial Review & Psychosocial Screening:  Initial Psych Review & Screening - 05/14/21 1411       Initial Review   Current issues with Current Stress Concerns      Family Dynamics   Good Support System? Yes   daughter     Barriers   Psychosocial barriers to participate in program There are no identifiable barriers or psychosocial needs.;The patient should benefit from training in stress management and relaxation.      Screening Interventions   Interventions Encouraged to exercise;Provide feedback about the scores to participant;To provide support and resources with identified psychosocial needs    Expected Outcomes Short Term goal: Utilizing psychosocial counselor, staff and physician to assist with identification of specific Stressors or current issues interfering with healing process. Setting desired goal for each stressor or current issue identified.;Long Term Goal: Stressors or current issues are controlled or eliminated.;Short Term goal: Identification and review with participant of any Quality of Life or Depression concerns found by scoring the questionnaire.;Long Term goal: The participant improves quality of Life and PHQ9 Scores as seen by post scores and/or verbalization of changes              Quality of Life Scores:   Quality of Life - 10/23/21 0828       Quality of Life Scores   Health/Function Pre 21 %    Health/Function Post 17.21 %    Health/Function % Change -18.05 %    Socioeconomic Pre 24.3 %    Socioeconomic Post 20.2 %    Socioeconomic % Change  -16.87 %    Psych/Spiritual Pre 26.57 %    Psych/Spiritual Post 20.43 %    Psych/Spiritual % Change -23.11 %    Family Pre 26.4 %    Family Post 17.6 %    Family % Change -33.33 %    GLOBAL Pre 23.66 %    GLOBAL Post 18.48 %    GLOBAL % Change -21.89 %            Scores of 19 and below usually indicate a poorer quality of life in these areas.  A difference of  2-3 points is a clinically meaningful difference.  A difference of 2-3 points in the total score of the Quality of Life Index has been associated with significant improvement in overall quality of life, self-image, physical symptoms, and general health in studies assessing change in quality of life.  PHQ-9: Review Flowsheet       10/23/2021 07/15/2021 05/22/2021  Depression screen PHQ 2/9  Decreased Interest  1 0 0  Down, Depressed, Hopeless 1 0 2  PHQ - 2 Score 2 0 2  Altered sleeping 0 0 0  Tired, decreased energy 1 1 0  Change in appetite 2 0 3  Feeling bad or failure about yourself  1 0 1  Trouble concentrating 1 0 0  Moving slowly or fidgety/restless 0 0 0  Suicidal thoughts 0 0 0  PHQ-9 Score _0 Difficult doing work/chores - Not difficult at all Somewhat difficult   Interpretation of Total Score  Total Score Depression Severity:  1-4 = Minimal depression, 5-9 = Mild depression, 10-14 = Moderate depression, 15-19 = Moderately severe depression, 20-27 = Severe depression   Psychosocial Evaluation and Intervention:  Psychosocial Evaluation - 05/14/21 1421       Psychosocial Evaluation & Interventions   Interventions Encouraged to exercise with the program and follow exercise prescription;Stress management education     Comments Nakkia originally was not going to participate in cardiac rehab but changed her mind because the copay was doable and she wants to feel better. She lives at home with her husband and their daughter helps her out. When asked about stress, she stated there was none she could talk about at that moment (due to her husband being in the room with her). She mentioned she hasn't had to reach out to anyone to talk about this stress, but staff told her the opportunity would present itself while she is here in the program if she wants to discuss what is going on. She said she felt safe in whatever the situation is. Her breathing difficulties have improved since after the stent. She still struggles with balance so she takes things slow so she doesn't fall. When asked about using a cane or a walker she mentioned her husband didn't want her to be dependent on one. She is extra careful when out of her normal setting so she doesn't get tripped up. Her daughter accompanies her to most of her apppointments. Anahi is hoping this program will help boost her stamina.    Expected Outcomes Short; attend cardiac rehab for education and exercise. Long: develop and maintain positive self care habits.    Continue Psychosocial Services  Follow up required by staff             Psychosocial Re-Evaluation:  Psychosocial Re-Evaluation     Row Name 06/12/21 0801 06/12/21 0809 07/10/21 0817 07/31/21 1610 09/04/21 9604     Psychosocial Re-Evaluation   Current issues with _1    Comments -- Patient reports no new stress or sleep concerns. She reports that some of her chruch friends are terminally and chronically ill and that makes her sad. She will take a nap to help relieve stress. Discussed some stress reducing activities as well as mindful meditation, she reports chores and taking care of her son keeps her busy  so she does not have time for anything like that. She relies on her daughter and husband for support. She reports sleeping well. She reports that some of her chruch friends are terminally and chronically ill and that makes her sad, she reports that she has given that to the lord and she will make them food like giving them chicken salad. She would like to make a care package to her granddaughter when she goes to college. She reports her marriage is hard - this is her third marriage. Her son  is also autistic and she reports this can be difficult at times. She will take a nap and read her bible to help relieve stress. She will also participate in her church as well to help relieve stress. She relies on her daughter for support - her daughter calls every day. She reports sleeping well. She reports that some of her church friends are terminally and chronically ill and that makes her sad, She reports her husband can be difficult to deal with. Her son is also autistic and she reports this can be difficult at times. She continues to take a nap, read her bible, and participate in her church as well to help relieve stress. She relies on her daughter for support - her daughter calls every day. She has been to [employer] this last week and visted her friend - she reports hvaing a good time even if she wasn't eating as well. She reports sleeping well.   Expected Outcomes -- Short: continue to attend cardiac rehab consistently for physical and mental health benefits. Long: Maintain good mental health habits and sleep patterns. ST: continue to attend cardiac rehab consistently for physical and mental health benefits. LT: Maintain good mental health habits and sleep patterns. ST: continue to attend cardiac rehab consistently for physical and mental health benefits. LT: Maintain good mental health habits and sleep patterns. ST: continue to attend cardiac rehab consistently for physical and mental health benefits. LT: Maintain good  mental health habits and sleep patterns.   Continue Psychosocial Services  -- -- Follow up required by staff Follow up required by staff Follow up required by staff    [place] Name 09/18/21 0810 10/21/21 0800           Psychosocial Re-Evaluation   Current issues with Current Stress Concerns Current Stress Concerns      Comments She reports that some of her church friends are terminally and chronically ill and that makes her sad, She reports her husband can still be difficult to deal with. Her son is also autistic and she reports this can be difficult at times. She continues to take a nap, read her bible, and participate in her church as well to help relieve stress. She relies on her daughter for support - her daughter calls every day. She reports sleeping well. Tenille is graduating soon to help her husband with his schedule.  She has enjoyed the program and getting out of house.  She is ready to get back to her normal routine again.      Expected Outcomes ST: continue to attend cardiac rehab consistently for physical and mental health benefits. LT: Maintain good mental health habits and sleep patterns. Continue to exercise for mental boost.      Interventions Encouraged to attend Cardiac Rehabilitation for the exercise --      Continue Psychosocial Services  Follow up required by staff --               Psychosocial Discharge (Final Psychosocial Re-Evaluation):  Psychosocial Re-Evaluation - 10/21/21 0800       Psychosocial Re-Evaluation   Current issues with Current Stress Concerns    Comments Jacie is graduating soon to help her husband with his schedule.  She has enjoyed the program and getting out of house.  She is ready to get back to her normal routine again.    Expected Outcomes Continue to exercise for mental boost.             Vocational Rehabilitation: Provide vocational  rehab assistance to qualifying candidates.   Vocational Rehab Evaluation & Intervention:  Vocational Rehab  - 10/23/21 [phone]       Discharge Vocational Rehab   Discharge [facility] retired             Education: Education Goals: Education classes will be provided on a variety of topics geared toward better understanding of heart health and risk factor modification. Participant will state understanding/return demonstration of topics presented as noted by education test scores.  Learning Barriers/Preferences:  Learning Barriers/Preferences - 05/14/21 1411       Learning Barriers/Preferences   Learning Barriers None    Learning Preferences Individual Instruction             General Cardiac Education Topics:  AED/CPR: - Group verbal and written instruction with the use of models to demonstrate the basic use of the AED with the basic ABC's of resuscitation.   Anatomy and Cardiac Procedures: - Group verbal and visual presentation and models provide information about basic cardiac anatomy and function. Reviews the testing methods done to diagnose heart disease and the outcomes of the test results. Describes the treatment choices: Medical Management, Angioplasty, or Coronary Bypass Surgery for treating various heart conditions including Myocardial Infarction, Angina, Valve Disease, and Cardiac Arrhythmias.  Written material given at graduation. Flowsheet Row Cardiac Rehab from 05/22/2021 in [facility] Cardiac and Pulmonary Rehab  Education need identified 05/22/21       Medication Safety: - Group verbal and visual instruction to review commonly prescribed medications for heart and lung disease. Reviews the medication, class of the drug, and side effects. Includes the steps to properly store meds and maintain the prescription regimen.  Written material given at graduation.   Intimacy: - Group verbal instruction through game format to discuss how heart and lung disease can affect sexual intimacy. Written material given at graduation..   Know Your Numbers and Heart  Failure: - Group verbal and visual instruction to discuss disease risk factors for cardiac and pulmonary disease and treatment options.  Reviews associated critical values for Overweight/Obesity, Hypertension, Cholesterol, and Diabetes.  Discusses basics of heart failure: signs/symptoms and treatments.  Introduces Heart Failure Zone chart for action plan for heart failure.  Written material given at graduation.   Infection Prevention: - Provides verbal and written material to individual with discussion of infection control including proper hand washing and proper equipment cleaning during exercise session. Flowsheet Row Cardiac Rehab from 05/22/2021 in [facility] Cardiac and Pulmonary Rehab  Education need identified 05/22/21  Date 05/22/21  Educator [place]  Instruction Review Code 1- Verbalizes Understanding       Falls Prevention: - Provides verbal and written material to individual with discussion of falls prevention and safety. Flowsheet Row Cardiac Rehab from 05/22/2021 in [facility] Cardiac and Pulmonary Rehab  Education need identified 05/22/21  Date 05/22/21  Educator [place]  Instruction Review Code 1- Verbalizes Understanding       Other: -Provides group and verbal instruction on various topics (see comments)   Knowledge Questionnaire Score:  Knowledge Questionnaire Score - 10/23/21 8016       Knowledge Questionnaire Score   Pre Score 21/26: Nutrition, Exercise, HR    Post Score 25/26             Core Components/Risk Factors/Patient Goals at Admission:  Personal Goals and Risk Factors at Admission - 05/22/21 1422       Core Components/Risk Factors/Patient Goals on Admission    Weight Management Yes;Weight Loss  Intervention Weight Management: Develop a combined nutrition and exercise program designed to reach desired caloric intake, while maintaining appropriate intake of nutrient and fiber, sodium and fats, and appropriate energy expenditure required for the weight  goal.;Weight Management: Provide education and appropriate resources to help participant work on and attain dietary goals.;Weight Management/Obesity: Establish reasonable short term and long term weight goals.    Admit Weight 204 lb (92.5 kg)    Goal Weight: Short Term 200 lb (90.7 kg)    Goal Weight: Long Term 190 lb (86.2 kg)    Expected Outcomes Short Term: Continue to assess and modify interventions until short term weight is achieved;Long Term: Adherence to nutrition and physical activity/exercise program aimed toward attainment of established weight goal;Weight Loss: Understanding of general recommendations for a balanced deficit meal plan, which promotes 1-2 lb weight loss per week and includes a negative energy balance of [phone] kcal/d;Understanding recommendations for meals to include 15-35% energy as protein, 25-35% energy from fat, 35-60% energy from carbohydrates, less than 269m of dietary cholesterol, 20-35 gm of total fiber daily;Understanding of distribution of calorie intake throughout the day with the consumption of 4-5 meals/snacks    Diabetes Yes    Intervention Provide education about signs/symptoms and action to take for hypo/hyperglycemia.;Provide education about proper nutrition, including hydration, and aerobic/resistive exercise prescription along with prescribed medications to achieve blood glucose in normal ranges: Fasting glucose 65-99 mg/dL    Expected Outcomes Short Term: Participant verbalizes understanding of the signs/symptoms and immediate care of hyper/hypoglycemia, proper foot care and importance of medication, aerobic/resistive exercise and nutrition plan for blood glucose control.;Long Term: Attainment of HbA1C < 7%.    Hypertension Yes    Intervention Provide education on lifestyle modifcations including regular physical activity/exercise, weight management, moderate sodium restriction and increased consumption of fresh fruit, vegetables, and low fat dairy, alcohol  moderation, and smoking cessation.;Monitor prescription use compliance.    Expected Outcomes Short Term: Continued assessment and intervention until BP is < 140/952mHG in hypertensive participants. < 130/8029mG in hypertensive participants with diabetes, heart failure or chronic kidney disease.;Long Term: Maintenance of blood pressure at goal levels.    Lipids Yes    Intervention Provide education and support for participant on nutrition & aerobic/resistive exercise along with prescribed medications to achieve LDL <16m51mDL >40mg64m Expected Outcomes Short Term: Participant states understanding of desired cholesterol values and is compliant with medications prescribed. Participant is following exercise prescription and nutrition guidelines.;Long Term: Cholesterol controlled with medications as prescribed, with individualized exercise RX and with personalized nutrition plan. Value goals: LDL < 16mg,25m > 40 mg.             Education:Diabetes - Individual verbal and written instruction to review signs/symptoms of diabetes, desired ranges of glucose level fasting, after meals and with exercise. Acknowledge that pre and post exercise glucose checks will be done for 3 sessions at entry of program. [place] [facility]  Education need identified 05/22/21  Date 05/22/21  Educator [place]  In[place] Review Code 1- Verbalizes Understanding       Core Components/Risk Factors/Patient Goals Review:   Goals and Risk Factor Review     Row Name 06/12/21 0807 07/10/21 0802 07/31/21 0810 09/04/21 0807 09/18/21 0807     Core Components/Risk Factors/Patient Goals Review   Personal Goals Review Weight Management/Obesity;Diabetes;Hypertension;Lipids Weight Management/Obesity;Diabetes;Hypertension;Lipids Weight Management/Obesity;Diabetes;Hypertension;Lipids Weight Management/Obesity;Diabetes;Hypertension;Lipids Weight  Management/Obesity;Diabetes;Hypertension;Lipids   Review Patient reports taking all medications  as prescribed by her doctor. She monitors BP and BG at home and states she knows what to do if she has abnormal ranges. Most of the time her BP and BG are within acceptable ranges. Her weight have been steady. Aysha went to her PCP Tuesday and he lowered her glipizide so she wouldn't have as many BG lows. Discussed nutrition with pt 07/08/21 and reviewed paperwork today. She continues to take her BG at home. She has a BP cuff and doesn't take it often as it normally runs fine, encouraged her to check it when not at rehab. Today at rehab her BP was 140/70, discussed how this was slightly more elevated than recommended for a resting BP. She continues to take her medications as prescribed with no reported issues. Saoirse reports losing a couple of pounds since beginning rehab, she mentioned with her PCP lowering her glipizide she can go longer without eating before having a low; encouraged her to still eat regular meals to meet her nutritional needs, but to choose more health promoting foods. Myli reports doing well on glipizide and reports not as many low BG. She continues to take her BG at home - about 115-120. She has a BP cuff and doesn't take it often as it normally runs fine, encouraged her to check it when not at rehab. Today at rehab her BP was 132/64. She continues to take her medications as prescribed with no reported issues. Shavana reports losing about 8 pounds since beginning rehab, she had mentioned with her PCP lowering her glipizide she can go longer without eating before having a low; reviewed that she should still eat regular meals to meet her nutritional needs, but to choose more health promoting foods. Jolissa reports doing well on glipizide and reports not as many low BG. She continues to take her BG at home - about 110-130s. She has a BP cuff and still doesn't take it often as it normally runs fine, encouraged her  to check it when not at rehab. Today at rehab her BP was 126/74. She continues to take her medications as prescribed with no reported issues. Leonila reportsgainign about 3 pounds since leaving for vacation beginning rehab - she continues to work towards losing weight. Alanis reports continuing to do well on her glipizde - she is having minimal lows and if she needs she has hard candy on hand. She continues to take her BG at home - about 1115-135s. She has a BP cuff and still doesn't take it often as it normally runs fine, again encouraged her to check it when not at rehab. Today at rehab her BP was 132/70. She continues to take her medications as prescribed with no reported issues. Haylea reports her weight has been around 200lbs. She feels that whenever she smiles in the mirror it looks like she had a stroke, no other symptoms - encouraged her to speak with her doctor.   Expected Outcomes Short: continue to take all meds and monitor BP and BG at home. Long: control cardiac risk facotrs with healthy lifestyle changes. ST: continue to take all meds and monitor BP and BG at home. LT: control cardiac risk facotrs with healthy lifestyle changes. ST: continue to take all meds and monitor BP and BG at home. LT: control cardiac risk facotrs with healthy lifestyle changes. ST: continue to take all meds and monitor BP and BG at home. LT: control cardiac risk facotrs with healthy lifestyle changes. ST: continue to take all meds and monitor BP  and BG at home. LT: control cardiac risk facotrs with healthy lifestyle changes.    [place] Name 10/21/21 0800             Core Components/Risk Factors/Patient Goals Review   Personal Goals Review Weight Management/Obesity;Diabetes;Hypertension;Lipids       Review Sugars on doing better and pressures are fairly stable.  She continues to hold steady on weight.       Expected Outcomes Continue to monitor risk factors.                Core Components/Risk Factors/Patient Goals at  Discharge (Final Review):   Goals and Risk Factor Review - 10/21/21 0800       Core Components/Risk Factors/Patient Goals Review   Personal Goals Review Weight Management/Obesity;Diabetes;Hypertension;Lipids    Review Sugars on doing better and pressures are fairly stable.  She continues to hold steady on weight.    Expected Outcomes Continue to monitor risk factors.             ITP Comments:  ITP Comments     Row Name 05/14/21 1426 05/22/21 0926 05/27/21 0756 05/28/21 0828 06/25/21 1321   ITP Comments Initial telephone orientation completed. Diagnosis can be found in [facility] 2/27. EP orientation scheduled for Thursday 3/16 at 8am. Completed 6MWT and gym orientation. Initial ITP created and sent for review to Dr. Emily Filbert, Medical Director. First full day of exercise!  Patient was oriented to gym and equipment including functions, settings, policies, and procedures.  Patient's individual exercise prescription and treatment plan were reviewed.  All starting workloads were established based on the results of the 6 minute walk test done at initial orientation visit.  The plan for exercise progression was also introduced and progression will be customized based on patient's performance and goals. 30 Day review completed. Medical Director ITP review done, changes made as directed, and signed approval by Medical Director.   New to program 30 Day review completed. Medical Director ITP review done, changes made as directed, and signed approval by Medical Director.    [place] Name 07/07/21 1403 07/23/21 0835 08/20/21 1226 09/17/21 0807 10/15/21 0709   ITP Comments Completed initial RD consultation 30 Day review completed. Medical Director ITP review done, changes made as directed, and signed approval by Medical Director. 30 Day review completed. Medical Director ITP review done, changes made as directed, and signed approval by Medical Director. 30 Day review completed. Medical Director ITP review done,  changes made as directed, and signed approval by Medical Director. 30 Day review completed. Medical Director ITP review done, changes made as directed, and signed approval by Medical Director.    [place] Name 10/23/21 0744           ITP Comments Anelis graduated today from  rehab with 31 sessions completed.  Details of the patient's exercise prescription and what She needs to do in order to continue the prescription and progress were discussed with patient.  Patient was given a copy of prescription and goals.  Patient verbalized understanding.  Sheletha plans to continue to exercise by getting an exercise bike at home for use.                Comments: Discharge ITP

## 2021-10-23 NOTE — Progress Notes (Signed)
Discharge Note  Coti Gucciardo    DOB:05/09/1945  Tiffany Alexander graduated today from  rehab with 31 sessions completed.  Details of the patient's exercise prescription and what She needs to do in order to continue the prescription and progress were discussed with patient.  Patient was given a copy of prescription and goals.  Patient verbalized understanding.  Tiffany Alexander plans to continue to exercise by getting an exercise bike at home for use.   [place] Name 05/22/21 0940 10/21/21 0806       6 Minute Walk   Phase Initial Discharge    Distance 435 feet 300 feet    Distance % Change -- -31 %    Distance Feet Change -- -135 ft    Walk Time 5.25 minutes  Break 3:45-4:25 5.9 minutes    # of Rest Breaks '1 1  10 '$ sec    MPH 0.94 0.58    METS 0.46 0.32    RPE 13 13    Perceived Dyspnea  2 2    VO2 Peak 1.63 1.13    Symptoms Yes (comment) Yes (comment)    Comments SOB SOB    Resting HR 62 bpm 82 bpm    Resting BP 154/74 140/72    Resting Oxygen Saturation  96 % 92 %    Exercise Oxygen Saturation  during 6 min walk 92 % 89 %    Max Ex. HR 74 bpm 94 bpm    Max Ex. BP 180/78 146/72    2 Minute Post BP 156/74 --            Thank you for the referral for Remo Lipps

## 2021-10-23 NOTE — Progress Notes (Deleted)
Cardiac Individual Treatment Plan  Patient Details  Name: Tiffany Alexander MRN: [id number] Date of Birth: November 19, 1945 Referring Provider:   Flowsheet Row Cardiac Rehab from 05/22/2021 in [facility] Cardiac and Pulmonary Rehab  Referring Provider Kathlyn Sacramento MD       Initial Encounter Date:  Flowsheet Row Cardiac Rehab from 05/22/2021 in [facility] Cardiac and Pulmonary Rehab  Date 05/22/21       Visit Diagnosis: Status post coronary artery stent placement  Patient's Home Medications on Admission:  Current Outpatient Medications:    albuterol (VENTOLIN HFA) 108 (90 Base) MCG/ACT inhaler, 2 puffs Q4H PRN, Disp: 18 g, Rfl: 0   amLODipine (NORVASC) 10 MG tablet, Take 10 mg by mouth at bedtime., Disp: , Rfl:    ascorbic acid (VITAMIN C) 500 MG tablet, Take 500 mg by mouth daily., Disp: , Rfl:    aspirin 81 MG chewable tablet, Chew 1 tablet (81 mg total) by mouth daily., Disp: 90 tablet, Rfl: 0   budesonide (PULMICORT) 0.25 MG/2ML nebulizer solution, Take 2 mLs (0.25 mg total) by nebulization 2 (two) times daily., Disp: 120 mL, Rfl: 6   calcium citrate-vitamin D (CITRACAL+D) 315-200 MG-UNIT tablet, Take 1 tablet by mouth 2 (two) times daily., Disp: , Rfl:    carvedilol (COREG) 25 MG tablet, Take 1 tablet (25 mg total) by mouth 2 (two) times daily., Disp: 180 tablet, Rfl: 0   cephALEXin (KEFLEX) 250 MG capsule, Take 250 mg by mouth daily. As needed for UTI, Disp: , Rfl:    Cholecalciferol (VITAMIN D3) 1000 units CAPS, Take by mouth daily., Disp: , Rfl:    citalopram (CELEXA) 20 MG tablet, Take by mouth., Disp: , Rfl:    clopidogrel (PLAVIX) 75 MG tablet, Take 1 tablet (75 mg total) by mouth daily with breakfast., Disp: 90 tablet, Rfl: 3   Cyanocobalamin (VITAMIN B 12 PO), Take 1 tablet by mouth daily., Disp: , Rfl:    diphenhydrAMINE (BENADRYL) 25 MG tablet, Take 75 mg by mouth daily as needed for allergies., Disp: , Rfl:    diphenoxylate-atropine (LOMOTIL) 2.5-0.025 MG tablet, Take 1 tablet by  mouth 2 (two) times daily as needed for diarrhea or loose stools., Disp: , Rfl:    fluticasone (FLONASE) 50 MCG/ACT nasal spray, Place 2 sprays into both nostrils 2 (two) times daily., Disp: , Rfl:    formoterol (PERFOROMIST) 20 MCG/2ML nebulizer solution, USE ONE VIAL VIA NEBULIZER AND INHALE BY MOUTH TWICE DAILY, Disp: 120 mL, Rfl: 11   furosemide (LASIX) 20 MG tablet, Take 20 mg by mouth daily., Disp: , Rfl:    glipiZIDE (GLUCOTROL XL) 5 MG 24 hr tablet, Take 5 mg by mouth daily with breakfast., Disp: , Rfl:    hydrALAZINE (APRESOLINE) 50 MG tablet, TAKE 1 TABLET BY MOUTH 3 TIMES DAILY, Disp: 90 tablet, Rfl: 0   hydrOXYzine (ATARAX/VISTARIL) 10 MG tablet, Take 10 mg by mouth every 8 (eight) hours as needed for itching., Disp: , Rfl:    Lansoprazole (PREVACID PO), Take by mouth., Disp: , Rfl:    losartan (COZAAR) 100 MG tablet, Take 100 mg by mouth at bedtime., Disp: , Rfl:    Melatonin 10 MG CAPS, Take 20 mg by mouth at bedtime., Disp: , Rfl:    Multiple Vitamins-Minerals (WOMENS MULTIVITAMIN PO), Take 1 tablet by mouth daily., Disp: , Rfl:    ondansetron (ZOFRAN-ODT) 4 MG disintegrating tablet, Take 1 tablet (4 mg total) by mouth every 8 (eight) hours as needed for nausea or vomiting., Disp: 20 tablet,  Rfl: 0   Probiotic Product (PROBIOTIC BLEND PO), Take 1 capsule by mouth daily., Disp: , Rfl:    rosuvastatin (CRESTOR) 20 MG tablet, Take 1 tablet (20 mg total) by mouth at bedtime., Disp: 30 tablet, Rfl: 5   traMADol (ULTRAM) 50 MG tablet, Take 50 mg by mouth 3 (three) times daily as needed for moderate pain., Disp: , Rfl:    traZODone (DESYREL) 150 MG tablet, Take 150 mg by mouth at bedtime., Disp: , Rfl:  No current facility-administered medications for this visit.  Facility-Administered Medications Ordered in Other Visits:    albuterol (PROVENTIL) (2.5 MG/3ML) 0.083% nebulizer solution 2.5 mg, 2.5 mg, Nebulization, Once, Wilhelmina Mcardle, MD  Past Medical History: Past Medical History:   Diagnosis Date   Arthritis    osteoarthritis   Carpal tunnel syndrome of left wrist    Depression    Diabetes mellitus without complication (HCC)    Diverticulosis    GERD (gastroesophageal reflux disease)    Hypertension    PONV (postoperative nausea and vomiting)    PONV (postoperative nausea and vomiting)    Pulmonary fibrosis (HCC)    Recurrent UTI    SUI (stress urinary incontinence, female)    Wears dentures    full upper and lower    Tobacco Use: Social History   Tobacco Use  Smoking Status Never  Smokeless Tobacco Never    Labs: Review Flowsheet        No data to display           Exercise Target Goals: Exercise Program Goal: Individual exercise prescription set using results from initial 6 min walk test and THRR while considering  patient's activity barriers and safety.   Exercise Prescription Goal: Initial exercise prescription builds to 30-45 minutes a day of aerobic activity, 2-3 days per week.  Home exercise guidelines will be given to patient during program as part of exercise prescription that the participant will acknowledge.   Education: Aerobic Exercise: - Group verbal and visual presentation on the components of exercise prescription. Introduces F.I.T.T principle from ACSM for exercise prescriptions.  Reviews F.I.T.T. principles of aerobic exercise including progression. Written material given at graduation. Flowsheet Row Cardiac Rehab from 05/22/2021 in [facility] Cardiac and Pulmonary Rehab  Education need identified 05/22/21       Education: Resistance Exercise: - Group verbal and visual presentation on the components of exercise prescription. Introduces F.I.T.T principle from ACSM for exercise prescriptions  Reviews F.I.T.T. principles of resistance exercise including progression. Written material given at graduation.    Education: Exercise & Equipment Safety: - Individual verbal instruction and demonstration of equipment use and safety with  use of the equipment. Flowsheet Row Cardiac Rehab from 05/22/2021 in [facility] Cardiac and Pulmonary Rehab  Education need identified 05/22/21  Date 05/22/21  Educator [place]  Instruction Review Code 1- Verbalizes Understanding       Education: Exercise Physiology & General Exercise Guidelines: - Group verbal and written instruction with models to review the exercise physiology of the cardiovascular system and associated critical values. Provides general exercise guidelines with specific guidelines to those with heart or lung disease.  Flowsheet Row Cardiac Rehab from 05/22/2021 in [facility] Cardiac and Pulmonary Rehab  Education need identified 05/22/21       Education: Flexibility, Balance, Mind/Body Relaxation: - Group verbal and visual presentation with interactive activity on the components of exercise prescription. Introduces F.I.T.T principle from ACSM for exercise prescriptions. Reviews F.I.T.T. principles of flexibility and balance exercise training including progression. Also  discusses the mind body connection.  Reviews various relaxation techniques to help reduce and manage stress (i.e. Deep breathing, progressive muscle relaxation, and visualization). Balance handout provided to take home. Written material given at graduation.   Activity Barriers & Risk Stratification:  Activity Barriers & Cardiac Risk Stratification - 05/22/21 0939       Activity Barriers & Cardiac Risk Stratification   Activity Barriers Balance Concerns;Arthritis;Joint Problems;Back Problems;Deconditioning;Muscular [employer] Device;Shortness of Breath    Cardiac Risk Stratification Moderate             6 Minute Walk:  6 Minute Walk     Row Name 05/22/21 0940 10/21/21 0806       6 Minute Walk   Phase Initial Discharge    Distance 435 feet 300 feet    Distance % Change -- -31 %    Distance Feet Change -- -135 ft    Walk Time 5.25 minutes  Break 3:45-4:25 5.9 minutes    # of Rest Breaks _0 sec     MPH 0.94 0.58    METS 0.46 0.32    RPE 13 13    Perceived Dyspnea  2 2    VO2 Peak 1.63 1.13    Symptoms Yes (comment) Yes (comment)    Comments SOB SOB    Resting HR 62 bpm 82 bpm    Resting BP 154/74 140/72    Resting Oxygen Saturation  96 % 92 %    Exercise Oxygen Saturation  during 6 min walk 92 % 89 %    Max Ex. HR 74 bpm 94 bpm    Max Ex. BP 180/78 146/72    2 Minute Post BP 156/74 --             Oxygen Initial Assessment:   Oxygen Re-Evaluation:   Oxygen Discharge (Final Oxygen Re-Evaluation):   Initial Exercise Prescription:  Initial Exercise Prescription - 05/22/21 1400       Date of Initial Exercise RX and Referring Provider   Date 05/22/21    Referring Provider Kathlyn Sacramento MD      Oxygen   Maintain Oxygen Saturation 88% or higher      Recumbant Bike   Level 1    RPM 60    Minutes 15    METs 1      NuStep   Level 1    SPM 80    Minutes 15    METs 1      Biostep-RELP   Level 1    SPM 50    Minutes 15    METs 1      Track   Laps 5   as tolerated   Minutes 15    METs 1.27      Prescription Details   Frequency (times per week) 2    Duration Progress to 30 minutes of continuous aerobic without signs/symptoms of physical distress      Intensity   THRR 40-80% of Max Heartrate 94 - 126    Ratings of Perceived Exertion 11-13    Perceived Dyspnea 0-4      Progression   Progression Continue to progress workloads to maintain intensity without signs/symptoms of physical distress.      Resistance Training   Training Prescription Yes    Weight 2 lb    Reps 10-15             Perform Capillary Blood Glucose checks as needed.  Exercise Prescription Changes:   Exercise  Prescription Changes     Row Name 05/22/21 1400 06/02/21 1100 06/12/21 0800 07/01/21 1600 07/28/21 1600     Response to Exercise   Blood Pressure (Admit) 154/74 110/58 -- 138/68 122/82   Blood Pressure (Exercise) 180/78 132/70 -- 142/64 --   Blood Pressure  (Exit) 156/74 124/62 -- 120/62 124/70   Heart Rate (Admit) 62 bpm 66 bpm -- 55 bpm 60 bpm   Heart Rate (Exercise) 74 bpm 75 bpm -- 86 bpm 73 bpm   Heart Rate (Exit) 66 bpm 65 bpm -- 72 bpm 56 bpm   Oxygen Saturation (Admit) 96 % -- -- -- --   Oxygen Saturation (Exercise) 92 % -- -- -- --   Oxygen Saturation (Exit) 95 % -- -- -- --   Rating of Perceived Exertion (Exercise) 13 14 -- 12 12   Perceived Dyspnea (Exercise) 2 -- -- -- --   Symptoms SOB none -- fatigue fatigue   Comments walk test results 1st full day of exercise -- -- --   Duration -- Progress to 30 minutes of  aerobic without signs/symptoms of physical distress Progress to 30 minutes of  aerobic without signs/symptoms of physical distress Progress to 30 minutes of  aerobic without signs/symptoms of physical distress Progress to 30 minutes of  aerobic without signs/symptoms of physical distress   Intensity -- THRR unchanged THRR unchanged THRR unchanged THRR unchanged     Progression   Progression -- Continue to progress workloads to maintain intensity without signs/symptoms of physical distress. Continue to progress workloads to maintain intensity without signs/symptoms of physical distress. Continue to progress workloads to maintain intensity without signs/symptoms of physical distress. Continue to progress workloads to maintain intensity without signs/symptoms of physical distress.   Average METs -- 1.1 1.1 1.95 1.88     Resistance Training   Training Prescription -- Yes Yes Yes Yes   Weight -- 2 lb 2 lb 2 lb 2 lb   Reps -- 10-15 10-15 10-15 10-15     Interval Training   Interval Training -- No No No No     Recumbant Bike   Level -- -- -- 1 1   Watts -- -- -- -- 8   Minutes -- -- -- 15 15   METs -- -- -- 3.1 3     NuStep   Level -- _0 Minutes -- _1 METs -- 1.1 1.1 1.6 2     Biostep-RELP   Level -- 1 1 -- --   Minutes -- 15 15 -- --   METs -- 1 1 -- --     Track   Laps -- -- -- -- 3   Minutes  -- -- -- -- 15   METs -- -- -- -- 1.3     Home Exercise Plan   Plans to continue exercise at -- -- Home (comment)  at 1-2 days a week of home exercies (walking, stretching, stregthening) at home when not in rehab. Home (comment)  at 1-2 days a week of home exercies (walking, stretching, stregthening) at home when not in rehab. Home (comment)  at 1-2 days a week of home exercies (walking, stretching, stregthening) at home when not in rehab.   Frequency -- -- Add 2 additional days to program exercise sessions. Add 2 additional days to program exercise sessions. Add 2 additional days to program exercise sessions.   Initial Home Exercises Provided -- -- 06/12/21 06/12/21 06/12/21  Oxygen   Maintain Oxygen Saturation -- 88% or higher 88% or higher 88% or higher 88% or higher    Row Name 08/11/21 0900 08/25/21 1400 09/08/21 0800 09/22/21 0700 10/06/21 0800     Response to Exercise   Blood Pressure (Admit) 122/64 128/76 126/74 132/70 112/62   Blood Pressure (Exit) 120/70 118/62 142/70 128/72 104/62   Heart Rate (Admit) 60 bpm 62 bpm 65 bpm 66 bpm 73 bpm   Heart Rate (Exercise) 85 bpm 69 bpm 77 bpm 83 bpm 98 bpm   Heart Rate (Exit) 61 bpm 58 bpm 74 bpm 70 bpm 66 bpm   Rating of Perceived Exertion (Exercise) _0 Symptoms _1    Duration Continue with 30 min of aerobic exercise without signs/symptoms of physical distress. Continue with 30 min of aerobic exercise without signs/symptoms of physical distress. Continue with 30 min of aerobic exercise without signs/symptoms of physical distress. Continue with 30 min of aerobic exercise without signs/symptoms of physical distress. Continue with 30 min of aerobic exercise without signs/symptoms of physical distress.   Intensity _2      Progression   Progression Continue to progress workloads to maintain intensity without signs/symptoms of physical  distress. Continue to progress workloads to maintain intensity without signs/symptoms of physical distress. Continue to progress workloads to maintain intensity without signs/symptoms of physical distress. Continue to progress workloads to maintain intensity without signs/symptoms of physical distress. Continue to progress workloads to maintain intensity without signs/symptoms of physical distress.   Average METs 1.78 1.72 2.19 2.22 2.21     Resistance Training   Training Prescription _3    Weight 2 lb 2 lb 2 lb 2 lb 0 lb   Reps 10-15 10-15 10-15 10-15 --  did not do resistance training     Interval Training   Interval Training _4      Recumbant Bike   Level _5 Watts _6 Minutes _7 METs 2.31 2.31 2.31 2.42 2.41     NuStep   Level _8 Minutes _9 METs 15 1.7 2 1.6 2.1     Biostep-RELP   Level -- 1 -- -- --   Minutes -- 15 -- -- --   METs -- 1 -- -- --     Track   Laps -- 5 -- -- --   Minutes -- 15 -- -- --   METs -- 1.27 -- -- --     Home Exercise Plan   Plans to continue exercise at Home (comment)  at 1-2 days a week of home exercies (walking, stretching, stregthening) at home when not in rehab. Home (comment)  at 1-2 days a week of home exercies (walking, stretching, stregthening) at home when not in rehab. Home (comment)  at 1-2 days a week of home exercies (walking, stretching, stregthening) at home when not in rehab. Home (comment)  at 1-2 days a week of home exercies (walking, stretching, stregthening) at home when not in rehab. Home (comment)  at 1-2 days a week of home exercies (walking, stretching, stregthening) at home when not in rehab.   Frequency Add 2 additional days to program exercise sessions. Add 2 additional days to program exercise sessions. Add 2 additional days to  program exercise sessions. Add 2 additional days to program exercise sessions. Add 2 additional days to program  exercise sessions.   Initial Home Exercises Provided 06/12/21 06/12/21 06/12/21 06/12/21 06/12/21     Oxygen   Maintain Oxygen Saturation 88% or higher 88% or higher 88% or higher 88% or higher 88% or higher    Row Name 10/20/21 0800             Response to Exercise   Blood Pressure (Admit) 126/74       Blood Pressure (Exit) 124/64       Heart Rate (Admit) 75 bpm       Heart Rate (Exercise) 90 bpm       Heart Rate (Exit) 78 bpm       Rating of Perceived Exertion (Exercise) 12       Symptoms none       Duration Continue with 30 min of aerobic exercise without signs/symptoms of physical distress.       Intensity THRR unchanged         Progression   Progression Continue to progress workloads to maintain intensity without signs/symptoms of physical distress.       Average METs 1.86         Resistance Training   Training Prescription Yes       Weight 0 lb       Reps --  did not do resistance training         Interval Training   Interval Training No         Recumbant Bike   Level 1       Watts 11       Minutes 30       METs 2.28         Track   Laps 5  Walked hallway       Minutes 15       METs 1.44         Home Exercise Plan   Plans to continue exercise at Home (comment)  at 1-2 days a week of home exercies (walking, stretching, stregthening) at home when not in rehab.       Frequency Add 2 additional days to program exercise sessions.       Initial Home Exercises Provided 06/12/21         Oxygen   Maintain Oxygen Saturation 88% or higher                Exercise Comments:   Exercise Comments     Row Name 05/27/21 0756 10/23/21 0744         Exercise Comments First full day of exercise!  Patient was oriented to gym and equipment including functions, settings, policies, and procedures.  Patient's individual exercise prescription and treatment plan were reviewed.  All starting workloads were established based on the results of the 6 minute walk test done at  initial orientation visit.  The plan for exercise progression was also introduced and progression will be customized based on patient's performance and goals. Tiffany Alexander graduated today from  rehab with 31 sessions completed.  Details of the patient's exercise prescription and what She needs to do in order to continue the prescription and progress were discussed with patient.  Patient was given a copy of prescription and goals.  Patient verbalized understanding.  Tiffany Alexander plans to continue to exercise by getting an exercise bike at home for use.  Exercise Goals and Review:   Exercise Goals     Row Name 05/22/21 1422             Exercise Goals   Increase Physical Activity Yes       Intervention Provide advice, education, support and counseling about physical activity/exercise needs.;Develop an individualized exercise prescription for aerobic and resistive training based on initial evaluation findings, risk stratification, comorbidities and participant's personal goals.       Expected Outcomes Short Term: Attend rehab on a regular basis to increase amount of physical activity.;Long Term: Add in home exercise to make exercise part of routine and to increase amount of physical activity.;Long Term: Exercising regularly at least 3-5 days a week.       Increase Strength and Stamina Yes       Intervention Provide advice, education, support and counseling about physical activity/exercise needs.;Develop an individualized exercise prescription for aerobic and resistive training based on initial evaluation findings, risk stratification, comorbidities and participant's personal goals.       Expected Outcomes Short Term: Increase workloads from initial exercise prescription for resistance, speed, and METs.;Short Term: Perform resistance training exercises routinely during rehab and add in resistance training at home;Long Term: Improve cardiorespiratory fitness, muscular endurance and strength as  measured by increased METs and functional capacity (6MWT)       Able to understand and use rate of perceived exertion (RPE) scale Yes       Intervention Provide education and explanation on how to use RPE scale       Expected Outcomes Short Term: Able to use RPE daily in rehab to express subjective intensity level;Long Term:  Able to use RPE to guide intensity level when exercising independently       Able to understand and use Dyspnea scale Yes       Intervention Provide education and explanation on how to use Dyspnea scale       Expected Outcomes Short Term: Able to use Dyspnea scale daily in rehab to express subjective sense of shortness of breath during exertion;Long Term: Able to use Dyspnea scale to guide intensity level when exercising independently       Knowledge and understanding of Target Heart Rate Range (THRR) Yes       Intervention Provide education and explanation of THRR including how the numbers were predicted and where they are located for reference       Expected Outcomes Short Term: Able to state/look up THRR;Long Term: Able to use THRR to govern intensity when exercising independently;Short Term: Able to use daily as guideline for intensity in rehab       Able to check pulse independently Yes       Intervention Provide education and demonstration on how to check pulse in carotid and radial arteries.;Review the importance of being able to check your own pulse for safety during independent exercise       Expected Outcomes Short Term: Able to explain why pulse checking is important during independent exercise;Long Term: Able to check pulse independently and accurately       Understanding of Exercise Prescription Yes       Intervention Provide education, explanation, and written materials on patient's individual exercise prescription       Expected Outcomes Short Term: Able to explain program exercise prescription;Long Term: Able to explain home exercise prescription to exercise  independently                Exercise Goals Re-Evaluation :  Exercise Goals Re-Evaluation     Row Name 05/27/21 0756 06/02/21 1117 06/12/21 0824 07/01/21 1612 07/10/21 0811     Exercise Goal Re-Evaluation   Exercise Goals Review Increase Physical Activity;Able to understand and use rate of perceived exertion (RPE) scale;Knowledge and understanding of Target Heart Rate Range (THRR);Understanding of Exercise Prescription;Able to understand and use Dyspnea scale;Able to check pulse independently;Increase Strength and Stamina Increase Physical Activity;Increase Strength and Stamina Increase Physical Activity;Increase Strength and Stamina;Able to understand and use rate of perceived exertion (RPE) scale;Able to understand and use Dyspnea scale;Knowledge and understanding of Target Heart Rate Range (THRR);Able to check pulse independently;Understanding of Exercise Prescription Increase Physical Activity;Increase Strength and Stamina;Understanding of Exercise Prescription Increase Physical Activity;Increase Strength and Stamina;Understanding of Exercise Prescription   Comments Reviewed RPE and dyspnea scales, THR and program prescription with pt today.  Pt voiced understanding and was given a copy of goals to take home. Tiffany Alexander completed her first session of rehab and tolerated it well. RPEs were in good range. We will continue to monitor as she progresses throughout the program. Reviewed home exercise with pt today.  Pt plans to walk for exercise.  Reviewed THR, pulse, RPE, sign and symptoms, pulse oximetery and when to call 911 or MD.  Also discussed weather considerations and indoor options.  Pt voiced understanding. Tiffany Alexander returned today after being out for two weeks.  She was able to return to her current workloads without a problem.  We will continue to monitor her progress. Tiffany Alexander reports doing no exercise at home right now, according to home exercise note, Tiffany Alexander reported she will walk for exercise - she does  not remember this. She reports outside is too bumpy to walk, but she can walk inside. She reports not being able to go to a gym for exercise while at rehab because her family doesn't like her driving. She reports not having access to youtube for our exercise videos, but she does still have the exerciuse packet. She reports that her arms are weak and she got a cortisone shot which did not help - discussed how this can alleviate pain, but wont build muscle; discussed importance of resistance exercise.   Expected Outcomes Short: Use RPE daily to regulate intensity. Long: Follow program prescription in THR. Short: Continue to exercise at current exercise prescription Long: Increase overall MET level Short: add 1-2 days of exercise at home on off days of rehab. Long: become independent with exercise program. Short: Return to regular attendance Long; Conintue to improve stamina. ST: Return to regular attendance, exercidse outside of rehab  LT; Conintue to improve stamina.    [place] Name 07/28/21 1558 07/31/21 0827 08/11/21 0932 08/25/21 1423 09/04/21 0815     Exercise Goal Re-Evaluation   Exercise Goals Review Increase Physical Activity;Increase Strength and Stamina;Understanding of Exercise Prescription Increase Physical Activity;Increase Strength and Stamina;Understanding of Exercise Prescription Increase Physical Activity;Increase Strength and Stamina;Understanding of Exercise Prescription Increase Physical Activity;Increase Strength and Stamina;Understanding of Exercise Prescription Increase Physical Activity;Increase Strength and Stamina;Understanding of Exercise Prescription   Comments Tiffany Alexander continues to have intermitten attendance due to transportation issues with her husband and his appointments.  We have encouraged her to try to attend reguarly to get the most out of the program, but she has been averaging about once a week.  She is able to walk some with her wheelchair around the track and has started to walk  down to rehab pushing the chair.  We will continue to montior her progress. Tiffany Alexander is not  exercising at home right now - discussed walking at home as outside is too bumpy and ustilizing the exercise packet given by the EP during home exercise. We discussed utilizing household itema such as water bottles and canned items that can fit in her hand comfortably. EP spoke with Tiffany Alexander about purchasing well made, cheap weights or resistance bands at [employer], [place], [place], [place] told her she could give her some resistance band handouts and show her how to use them. Tiffany Alexander has been coming when she is able to with transportation. She does not walk at rehab but has started to push her wheelchair down to rehab which is good for her as she was not able to do that before. She continues to work at level 1 on the recumbant bike and hope to see that increase to level 2. We will continue to monitor. Tiffany Alexander continues to do well in rehab. She was able to walk 5 laps on the track with a couple breaks in between. She also increased to level 3 on the T4 [place]. She should attempt to increase to level 2 on the recumbant bike. Will continue to monitor. Tiffany Alexander is is still walking at home, but not outside. She has a walker at home. Encouraged her to ustilize the exercise packet given by the EP during home exercise as well as buying some cheap weights for resistance exercise at home.   Expected Outcomes Short: Aim for regular attendance in rehab Long; Conitnue to walk more Short: Aim for regular attendance in rehab, purchase resistance bands and/or weights Long: Conitnue to walk more Short: Continue to push wheelchair down to incorporate walking Long: Increase overall MET level Short: Increase level on RB Long: Continue to build up overall strength and stamina Short: begin structured exercise outside of rehab  Long: Continue to build up overall strength and stamina    [place] Name 09/08/21 0807 09/18/21 0810 09/22/21 0802 10/06/21 0824 10/20/21  0814     Exercise Goal Re-Evaluation   Exercise Goals Review Increase Physical Activity;Increase Strength and Stamina;Understanding of Exercise Prescription Increase Physical Activity;Increase Strength and Stamina;Understanding of Exercise Prescription Increase Physical Activity;Increase Strength and Stamina;Understanding of Exercise Prescription Increase Physical Activity;Increase Strength and Stamina;Understanding of Exercise Prescription Increase Physical Activity;Increase Strength and Stamina;Understanding of Exercise Prescription   Comments Tiffany Alexander is doing well in rehab. She improved her average overall METs to 2.19 METs. She has also tolerated 2 lb hand weights and may benefit from trying 3 lb for resistance training. Urvi has tolerated level 1 on the recumbent bike as well and could benefit from trying level 2. We will continue to monitor her progress in the program. Tiffany Alexander continues doing well in rehab. She has ordered a stationary bike for home which has not arrived yet. She is excited to use it. She continues to walk in the house for the meantime. Tiffany Alexander is doing well in rehab. She is up to level 4 on the T4. She also improved her overall average MET level to 2.22 METs. Tiffany Alexander has tolerated level 1 on the recumbent bike and may benefit from trying level 2. We will continue to monitor her progress in the program. Tiffany Alexander is doing well in rehab. She got up to 2.1 METs on the T4. She also was able to stay at 2.41 METs for the entire 30 minutes on the recumbent bike. She will look to improve on her post 6MWT soon. We will continue to monitor her progress in the program. Tiffany Alexander is doing well  in rehab. She has been able to do the recumbent bike at level 1 for up to 30 minutes. She has also consistently kept her MET levels above 2 METs. She has not been able to participate in resistance training since the last review. We will continue to monitor her progress in the program.   Expected Outcomes Short: try level 2 on the  recumbent bike. Long: Continue to improve overall MET level. ST: start structured exercise at home at least 2x/week. LT: exercise at least 150 minutes of moderate aerobic activity Short: Try level 2 on the Recumbent Bike. Long: Continue to improve overall MET levels. Short: Complete post 6MWT. Long: Continue to increase strength and stamina. Short: Complete post 6MWT. Long: Continue to increase strength and stamina.    [place] Name 10/21/21 0750             Exercise Goal Re-Evaluation   Exercise Goals Review Increase Physical Activity;Increase Strength and Stamina;Understanding of Exercise Prescription       Comments Tiffany Alexander has done well in rehab.  She has decided to graduate early for her husband's schedule.  She is planning to graduate on her next visit and plans to walk at home along with stretching.  She did not improve her walk test as she has not been walking as much at home with the heat recently.       Expected Outcomes Continue to exercise independently                Discharge Exercise Prescription (Final Exercise Prescription Changes):  Exercise Prescription Changes - 10/20/21 0800       Response to Exercise   Blood Pressure (Admit) 126/74    Blood Pressure (Exit) 124/64    Heart Rate (Admit) 75 bpm    Heart Rate (Exercise) 90 bpm    Heart Rate (Exit) 78 bpm    Rating of Perceived Exertion (Exercise) 12    Symptoms none    Duration Continue with 30 min of aerobic exercise without signs/symptoms of physical distress.    Intensity THRR unchanged      Progression   Progression Continue to progress workloads to maintain intensity without signs/symptoms of physical distress.    Average METs 1.86      Resistance Training   Training Prescription Yes    Weight 0 lb    Reps --   did not do resistance training     Interval Training   Interval Training No      Recumbant Bike   Level 1    Watts 11    Minutes 30    METs 2.28      Track   Laps 5   Walked hallway   Minutes  15    METs 1.44      Home Exercise Plan   Plans to continue exercise at Home (comment)   at 1-2 days a week of home exercies (walking, stretching, stregthening) at home when not in rehab.   Frequency Add 2 additional days to program exercise sessions.    Initial Home Exercises Provided 06/12/21      Oxygen   Maintain Oxygen Saturation 88% or higher             Nutrition:  Target Goals: Understanding of nutrition guidelines, daily intake of sodium <1580m, cholesterol <2022m calories 30% from fat and 7% or less from saturated fats, daily to have 5 or more servings of fruits and vegetables.  Education: All About Nutrition: -Group instruction provided by verbal,  written material, interactive activities, discussions, models, and posters to present general guidelines for heart healthy nutrition including fat, fiber, MyPlate, the role of sodium in heart healthy nutrition, utilization of the nutrition label, and utilization of this knowledge for meal planning. Follow up email sent as well. Written material given at graduation. Flowsheet Row Cardiac Rehab from 05/22/2021 in [facility] Cardiac [facility]  Education need identified 05/22/21       Biometrics:  Pre Biometrics - 05/22/21 0939       Pre Biometrics   Height 5' 2.5" (1.588 m)    Weight 204 lb 11.2 oz (92.9 kg)    BMI (Calculated) 36.82    Single Leg Stand 0 seconds             Post Biometrics - 10/21/21 2500        Post  Biometrics   Height 5' 2.5" (1.588 m)    Weight 197 lb 14.4 oz (89.8 kg)    BMI (Calculated) 35.6    Single Leg Stand 0 seconds             Nutrition Therapy Plan and Nutrition Goals:  Nutrition Therapy & Goals - 07/07/21 1427       Nutrition Therapy   Diet Heart healthy, low Na, T2DM    Protein (specify units) 75g    Fiber 25 grams    Whole Grain Foods 3 servings    Saturated Fats 12 max. grams    Fruits and Vegetables 8 servings/day    Sodium 2 grams      Personal Nutrition  Goals   Nutrition Goal ST: add at least 1 CHO serving to Breakfast and Lunch to avoid BG lows LT: Maintain A1C <7, follow MyPlate guidelines, eat consistent CHOs    Comments 76 y.o. F admitted to cardiac rehab s/p coronary artery placement. PMHx of asthma, HTN, T2DM, pulmonary fibrosis, coronary calcification, GERD, diverticulosis. Pt wears dentures. Relevant medications include vit C, Calcium w/ vit D, vit D3, vitamin B12, lasix, glipizide, lomotil, lansoprazole, MVI, zofran, probiotic, crestor, tramadol, trazodone. PYP Score: 58. Vegetables & Fruits 5/12. Breads, Grains & Cereals 7/12. Red & Processed Meat 4/12. Poultry 2/2. Fish & Shellfish 0/4. Beans, Nuts & Seeds 1/4. Milk & Dairy Foods 2/6. Toppings, Oils, Seasonings & Salt 13/20. Sweets, Snacks & Restaurant Food 14/14. Beverages 10/10. Coffee B: biscuit - not as much anymore, 2 eggs and toast (keto bread: 2 slices = 37C CHO, 6g fiber) L: will sometimes skip, sandwich (peanut butter or [place] - keto bread) or leftovers S: she will have low sugar around here D:3-4pm: meat with 2 vegetables. Discussed general heart healthy eating as well as T2DM MNT.      Intervention Plan   Intervention Prescribe, educate and counsel regarding individualized specific dietary modifications aiming towards targeted core components such as weight, hypertension, lipid management, diabetes, heart failure and other comorbidities.    Expected Outcomes Short Term Goal: Understand basic principles of dietary content, such as calories, fat, sodium, cholesterol and nutrients.;Short Term Goal: A plan has been developed with personal nutrition goals set during dietitian appointment.;Long Term Goal: Adherence to prescribed nutrition plan.             Nutrition Assessments:  MEDIFICTS Score Key: ?70 Need to make dietary changes  40-70 Heart Healthy Diet ? 40 Therapeutic Level Cholesterol Diet  Flowsheet Row Cardiac Rehab from 05/22/2021 in [facility] Cardiac [facility]   Picture Your Plate Total Score on Admission 58  Picture Your Plate Scores: <76 Unhealthy dietary pattern with much room for improvement. 41-50 Dietary pattern unlikely to meet recommendations for good health and room for improvement. 51-60 More healthful dietary pattern, with some room for improvement.  >60 Healthy dietary pattern, although there may be some specific behaviors that could be improved.    Nutrition Goals Re-Evaluation:  Nutrition Goals Re-Evaluation     [place] Name 06/12/21 0759 07/31/21 0814 09/04/21 0801 09/18/21 0804 10/21/21 0759     Goals   Nutrition Goal -- ST: Continue to make sure meals and snacks have protein, fiber, and fat. Make sure meals have at least 2-3 CHO servings and snacks 1-2. LT: Maintain A1C <7, follow MyPlate guidelines, eat consistent CHOs ST: Practice MyPlate structure. LT: Maintain A1C <7, follow MyPlate guidelines, eat consistent CHOs ST: Practice MyPlate structure. LT: Maintain A1C <7, follow MyPlate guidelines, eat consistent CHOs ST: Practice MyPlate structure. LT: Maintain A1C <7, follow MyPlate guidelines, eat consistent CHOs   Comment Patient has not yet met with program dietician to set nutrition goals. She has a scheduled appointment on June 26, 2021. She reports liking cooking and eating. She has optifast bar as one snack - 4g fiber, 17g CHO, protein 16g. She got a new whole grain bread and eats more vegetables; she is practicing MyPlate structure. She would like to continue with her current changes so far. We discussed some other healthy snacks such as fruit with nuts/seeds - she enjoys fruit, but it runs her BG up; discussed pairing CHO rich foods with protein and fat. She is baking chicken with vinegar - her grandchildren calls it vinegar chicken. Kineta reports that her BG has been high because she went to [facility] and she reports not eating like she was supposed to like having coconut cake and then brownies when she got home because her  grandson made them. She is enjoying her whole grain bread thats he bought and is continuing to eat more vegetables. She continues to practice MyPlate structure. Discussed practicing MyPlate structure even when she doesn't cook it herself like at church and at the 4th of July BBQ coming up. Makhia reports getting back on track with her nutrition after vacation. She continues to enjoy her whole grain bread thats he bought and is continuing to eat more vegetables. She continues to practice MyPlate structure. She even was mindful about balance on her plate on July 4th. Continues to work on diet.  She was encouraged to follow heart healthy diet guidelines.   Expected Outcome Short: Meet with program dietician to set nutrition goals. Long: Work towards diet changes given by Tourist information centre manager. ST: Continue to make sure meals and snacks have protein, fiber, and fat. Make sure meals have at least 2-3 CHO servings and snacks 1-2. LT: Maintain A1C <7, follow MyPlate guidelines, eat consistent CHOs ST: Practice MyPlate structure. LT: Maintain A1C <7, follow MyPlate guidelines, eat consistent CHOs ST: Practice MyPlate structure. LT: Maintain A1C <7, follow MyPlate guidelines, eat consistent CHOs ST: Practice MyPlate structure. LT: Maintain A1C <7, follow MyPlate guidelines, eat consistent CHOs            Nutrition Goals Discharge (Final Nutrition Goals Re-Evaluation):  Nutrition Goals Re-Evaluation - 10/21/21 0759       Goals   Nutrition Goal ST: Practice MyPlate structure. LT: Maintain A1C <7, follow MyPlate guidelines, eat consistent CHOs    Comment Continues to work on diet.  She was encouraged to follow heart healthy diet guidelines.    Expected  Outcome ST: Practice MyPlate structure. LT: Maintain A1C <7, follow MyPlate guidelines, eat consistent CHOs             Psychosocial: Target Goals: Acknowledge presence or absence of significant depression and/or stress, maximize coping skills, provide positive  support system. Participant is able to verbalize types and ability to use techniques and skills needed for reducing stress and depression.   Education: Stress, Anxiety, and Depression - Group verbal and visual presentation to define topics covered.  Reviews how body is impacted by stress, anxiety, and depression.  Also discusses healthy ways to reduce stress and to treat/manage anxiety and depression.  Written material given at graduation.   Education: Sleep Hygiene -Provides group verbal and written instruction about how sleep can affect your health.  Define sleep hygiene, discuss sleep cycles and impact of sleep habits. Review good sleep hygiene tips.    Initial Review & Psychosocial Screening:  Initial Psych Review & Screening - 05/14/21 1411       Initial Review   Current issues with Current Stress Concerns      Family Dynamics   Good Support System? Yes   daughter     Barriers   Psychosocial barriers to participate in program There are no identifiable barriers or psychosocial needs.;The patient should benefit from training in stress management and relaxation.      Screening Interventions   Interventions Encouraged to exercise;Provide feedback about the scores to participant;To provide support and resources with identified psychosocial needs    Expected Outcomes Short Term goal: Utilizing psychosocial counselor, staff and physician to assist with identification of specific Stressors or current issues interfering with healing process. Setting desired goal for each stressor or current issue identified.;Long Term Goal: Stressors or current issues are controlled or eliminated.;Short Term goal: Identification and review with participant of any Quality of Life or Depression concerns found by scoring the questionnaire.;Long Term goal: The participant improves quality of Life and PHQ9 Scores as seen by post scores and/or verbalization of changes             Quality of Life Scores:    Quality of Life - 05/22/21 0936       Quality of Life   Select Quality of Life      Quality of Life Scores   Health/Function Pre 21 %    Socioeconomic Pre 24.3 %    Psych/Spiritual Pre 26.57 %    Family Pre 26.4 %    GLOBAL Pre 23.66 %            Scores of 19 and below usually indicate a poorer quality of life in these areas.  A difference of  2-3 points is a clinically meaningful difference.  A difference of 2-3 points in the total score of the Quality of Life Index has been associated with significant improvement in overall quality of life, self-image, physical symptoms, and general health in studies assessing change in quality of life.  PHQ-9: Review Flowsheet       07/15/2021 05/22/2021  Depression screen PHQ 2/9  Decreased Interest 0 0  Down, Depressed, Hopeless 0 2  PHQ - 2 Score 0 2  Altered sleeping 0 0  Tired, decreased energy 1 0  Change in appetite 0 3  Feeling bad or failure about yourself  0 1  Trouble concentrating 0 0  Moving slowly or fidgety/restless 0 0  Suicidal thoughts 0 0  PHQ-9 Score 1 6  Difficult doing work/chores Not difficult at all Somewhat difficult  Interpretation of Total Score  Total Score Depression Severity:  1-4 = Minimal depression, 5-9 = Mild depression, 10-14 = Moderate depression, 15-19 = Moderately severe depression, 20-27 = Severe depression   Psychosocial Evaluation and Intervention:  Psychosocial Evaluation - 05/14/21 1421       Psychosocial Evaluation & Interventions   Interventions Encouraged to exercise with the program and follow exercise prescription;Stress management education    Comments Tiffany Alexander originally was not going to participate in cardiac rehab but changed her mind because the copay was doable and she wants to feel better. She lives at home with her husband and their daughter helps her out. When asked about stress, she stated there was none she could talk about at that moment (due to her husband being in the room  with her). She mentioned she hasn't had to reach out to anyone to talk about this stress, but staff told her the opportunity would present itself while she is here in the program if she wants to discuss what is going on. She said she felt safe in whatever the situation is. Her breathing difficulties have improved since after the stent. She still struggles with balance so she takes things slow so she doesn't fall. When asked about using a cane or a walker she mentioned her husband didn't want her to be dependent on one. She is extra careful when out of her normal setting so she doesn't get tripped up. Her daughter accompanies her to most of her apppointments. Darnetta is hoping this program will help boost her stamina.    Expected Outcomes Short; attend cardiac rehab for education and exercise. Long: develop and maintain positive self care habits.    Continue Psychosocial Services  Follow up required by staff             Psychosocial Re-Evaluation:  Psychosocial Re-Evaluation     [place] Name 06/12/21 0801 06/12/21 0809 07/10/21 0817 07/31/21 5498 09/04/21 2641     Psychosocial Re-Evaluation   Current issues with _0    Comments -- Patient reports no new stress or sleep concerns. She reports that some of her chruch friends are terminally and chronically ill and that makes her sad. She will take a nap to help relieve stress. Discussed some stress reducing activities as well as mindful meditation, she reports chores and taking care of her son keeps her busy so she does not have time for anything like that. She relies on her daughter and husband for support. She reports sleeping well. She reports that some of her chruch friends are terminally and chronically ill and that makes her sad, she reports that she has given that to the lord and she will make them food like giving them chicken salad. She  would like to make a care package to her granddaughter when she goes to college. She reports her marriage is hard - this is her third marriage. Her son is also autistic and she reports this can be difficult at times. She will take a nap and read her bible to help relieve stress. She will also participate in her church as well to help relieve stress. She relies on her daughter for support - her daughter calls every day. She reports sleeping well. She reports that some of her church friends are terminally and chronically ill and that makes her sad, She reports her husband can be difficult to deal with. Her son is also autistic  and she reports this can be difficult at times. She continues to take a nap, read her bible, and participate in her church as well to help relieve stress. She relies on her daughter for support - her daughter calls every day. She has been to [employer] this last week and visted her friend - she reports hvaing a good time even if she wasn't eating as well. She reports sleeping well.   Expected Outcomes -- Short: continue to attend cardiac rehab consistently for physical and mental health benefits. Long: Maintain good mental health habits and sleep patterns. ST: continue to attend cardiac rehab consistently for physical and mental health benefits. LT: Maintain good mental health habits and sleep patterns. ST: continue to attend cardiac rehab consistently for physical and mental health benefits. LT: Maintain good mental health habits and sleep patterns. ST: continue to attend cardiac rehab consistently for physical and mental health benefits. LT: Maintain good mental health habits and sleep patterns.   Continue Psychosocial Services  -- -- Follow up required by staff Follow up required by staff Follow up required by staff    [place] Name 09/18/21 0810 10/21/21 0800           Psychosocial Re-Evaluation   Current issues with Current Stress Concerns Current Stress Concerns      Comments She  reports that some of her church friends are terminally and chronically ill and that makes her sad, She reports her husband can still be difficult to deal with. Her son is also autistic and she reports this can be difficult at times. She continues to take a nap, read her bible, and participate in her church as well to help relieve stress. She relies on her daughter for support - her daughter calls every day. She reports sleeping well. Tiffany Alexander is graduating soon to help her husband with his schedule.  She has enjoyed the program and getting out of house.  She is ready to get back to her normal routine again.      Expected Outcomes ST: continue to attend cardiac rehab consistently for physical and mental health benefits. LT: Maintain good mental health habits and sleep patterns. Continue to exercise for mental boost.      Interventions Encouraged to attend Cardiac Rehabilitation for the exercise --      Continue Psychosocial Services  Follow up required by staff --               Psychosocial Discharge (Final Psychosocial Re-Evaluation):  Psychosocial Re-Evaluation - 10/21/21 0800       Psychosocial Re-Evaluation   Current issues with Current Stress Concerns    Comments Tiffany Alexander is graduating soon to help her husband with his schedule.  She has enjoyed the program and getting out of house.  She is ready to get back to her normal routine again.    Expected Outcomes Continue to exercise for mental boost.             Vocational Rehabilitation: Provide vocational rehab assistance to qualifying candidates.   Vocational Rehab Evaluation & Intervention:  Vocational Rehab - 05/14/21 1411       Initial Vocational Rehab Evaluation & Intervention   Assessment shows need for Vocational Rehabilitation No             Education: Education Goals: Education classes will be provided on a variety of topics geared toward better understanding of heart health and risk factor modification. Participant will  state understanding/return demonstration of topics presented as noted by education  test scores.  Learning Barriers/Preferences:  Learning Barriers/Preferences - 05/14/21 1411       Learning Barriers/Preferences   Learning Barriers None    Learning Preferences Individual Instruction             General Cardiac Education Topics:  AED/CPR: - Group verbal and written instruction with the use of models to demonstrate the basic use of the AED with the basic ABC's of resuscitation.   Anatomy and Cardiac Procedures: - Group verbal and visual presentation and models provide information about basic cardiac anatomy and function. Reviews the testing methods done to diagnose heart disease and the outcomes of the test results. Describes the treatment choices: Medical Management, Angioplasty, or Coronary Bypass Surgery for treating various heart conditions including Myocardial Infarction, Angina, Valve Disease, and Cardiac Arrhythmias.  Written material given at graduation. Flowsheet Row Cardiac Rehab from 05/22/2021 in [facility] Cardiac and Pulmonary Rehab  Education need identified 05/22/21       Medication Safety: - Group verbal and visual instruction to review commonly prescribed medications for heart and lung disease. Reviews the medication, class of the drug, and side effects. Includes the steps to properly store meds and maintain the prescription regimen.  Written material given at graduation.   Intimacy: - Group verbal instruction through game format to discuss how heart and lung disease can affect sexual intimacy. Written material given at graduation..   Know Your Numbers and Heart Failure: - Group verbal and visual instruction to discuss disease risk factors for cardiac and pulmonary disease and treatment options.  Reviews associated critical values for Overweight/Obesity, Hypertension, Cholesterol, and Diabetes.  Discusses basics of heart failure: signs/symptoms and treatments.   Introduces Heart Failure Zone chart for action plan for heart failure.  Written material given at graduation.   Infection Prevention: - Provides verbal and written material to individual with discussion of infection control including proper hand washing and proper equipment cleaning during exercise session. Flowsheet Row Cardiac Rehab from 05/22/2021 in [facility] Cardiac and Pulmonary Rehab  Education need identified 05/22/21  Date 05/22/21  Educator [place]  Instruction Review Code 1- Verbalizes Understanding       Falls Prevention: - Provides verbal and written material to individual with discussion of falls prevention and safety. Flowsheet Row Cardiac Rehab from 05/22/2021 in [facility] Cardiac and Pulmonary Rehab  Education need identified 05/22/21  Date 05/22/21  Educator [place]  Instruction Review Code 1- Verbalizes Understanding       Other: -Provides group and verbal instruction on various topics (see comments)   Knowledge Questionnaire Score:  Knowledge Questionnaire Score - 05/22/21 0933       Knowledge Questionnaire Score   Pre Score 21/26: Nutrition, Exercise, HR             Core Components/Risk Factors/Patient Goals at Admission:  Personal Goals and Risk Factors at Admission - 05/22/21 1422       Core Components/Risk Factors/Patient Goals on Admission    Weight Management Yes;Weight Loss    Intervention Weight Management: Develop a combined nutrition and exercise program designed to reach desired caloric intake, while maintaining appropriate intake of nutrient and fiber, sodium and fats, and appropriate energy expenditure required for the weight goal.;Weight Management: Provide education and appropriate resources to help participant work on and attain dietary goals.;Weight Management/Obesity: Establish reasonable short term and long term weight goals.    Admit Weight 204 lb (92.5 kg)    Goal Weight: Short Term 200 lb (90.7 kg)    Goal Weight: Long Term  190 lb (86.2 kg)     Expected Outcomes Short Term: Continue to assess and modify interventions until short term weight is achieved;Long Term: Adherence to nutrition and physical activity/exercise program aimed toward attainment of established weight goal;Weight Loss: Understanding of general recommendations for a balanced deficit meal plan, which promotes 1-2 lb weight loss per week and includes a negative energy balance of [id number] kcal/d;Understanding recommendations for meals to include 15-35% energy as protein, 25-35% energy from fat, 35-60% energy from carbohydrates, less than 279m of dietary cholesterol, 20-35 gm of total fiber daily;Understanding of distribution of calorie intake throughout the day with the consumption of 4-5 meals/snacks    Diabetes Yes    Intervention Provide education about signs/symptoms and action to take for hypo/hyperglycemia.;Provide education about proper nutrition, including hydration, and aerobic/resistive exercise prescription along with prescribed medications to achieve blood glucose in normal ranges: Fasting glucose 65-99 mg/dL    Expected Outcomes Short Term: Participant verbalizes understanding of the signs/symptoms and immediate care of hyper/hypoglycemia, proper foot care and importance of medication, aerobic/resistive exercise and nutrition plan for blood glucose control.;Long Term: Attainment of HbA1C < 7%.    Hypertension Yes    Intervention Provide education on lifestyle modifcations including regular physical activity/exercise, weight management, moderate sodium restriction and increased consumption of fresh fruit, vegetables, and low fat dairy, alcohol moderation, and smoking cessation.;Monitor prescription use compliance.    Expected Outcomes Short Term: Continued assessment and intervention until BP is < 140/938mHG in hypertensive participants. < 130/8073mG in hypertensive participants with diabetes, heart failure or chronic kidney disease.;Long Term: Maintenance of blood  pressure at goal levels.    Lipids Yes    Intervention Provide education and support for participant on nutrition & aerobic/resistive exercise along with prescribed medications to achieve LDL <60m7mDL >40mg28m Expected Outcomes Short Term: Participant states understanding of desired cholesterol values and is compliant with medications prescribed. Participant is following exercise prescription and nutrition guidelines.;Long Term: Cholesterol controlled with medications as prescribed, with individualized exercise RX and with personalized nutrition plan. Value goals: LDL < 60mg,46m > 40 mg.             Education:Diabetes - Individual verbal and written instruction to review signs/symptoms of diabetes, desired ranges of glucose level fasting, after meals and with exercise. Acknowledge that pre and post exercise glucose checks will be done for 3 sessions at entry of program. [place]  Education need identified 05/22/21  Date 05/22/21  Educator [place]  [place] Review Code 1- Verbalizes Understanding       Core Components/Risk Factors/Patient Goals Review:   Goals and Risk Factor Review     Row Name 06/12/21 0807 07/10/21 0802 07/31/21 0810 09/04/21 0807 09/18/21 0807     Core Components/Risk Factors/Patient Goals Review   Personal Goals Review Weight Management/Obesity;Diabetes;Hypertension;Lipids Weight Management/Obesity;Diabetes;Hypertension;Lipids Weight Management/Obesity;Diabetes;Hypertension;Lipids Weight Management/Obesity;Diabetes;Hypertension;Lipids Weight Management/Obesity;Diabetes;Hypertension;Lipids   Review Patient reports taking all medications as prescribed by her doctor. She monitors BP and BG at home and states she knows what to do if she has abnormal ranges. Most of the time her BP and BG are within acceptable ranges. Her weight have been steady. Lenee wKhrystalto her PCP Tuesday and he lowered her  glipizide so she wouldn't have as many BG lows. Discussed nutrition with pt 07/08/21 and reviewed paperwork today. She continues to take her BG at home. She has a BP cuff and doesn't take it often as  it normally runs fine, encouraged her to check it when not at rehab. Today at rehab her BP was 140/70, discussed how this was slightly more elevated than recommended for a resting BP. She continues to take her medications as prescribed with no reported issues. Tiffany Alexander reports losing a couple of pounds since beginning rehab, she mentioned with her PCP lowering her glipizide she can go longer without eating before having a low; encouraged her to still eat regular meals to meet her nutritional needs, but to choose more health promoting foods. Quatisha reports doing well on glipizide and reports not as many low BG. She continues to take her BG at home - about 115-120. She has a BP cuff and doesn't take it often as it normally runs fine, encouraged her to check it when not at rehab. Today at rehab her BP was 132/64. She continues to take her medications as prescribed with no reported issues. Tiffany Alexander reports losing about 8 pounds since beginning rehab, she had mentioned with her PCP lowering her glipizide she can go longer without eating before having a low; reviewed that she should still eat regular meals to meet her nutritional needs, but to choose more health promoting foods. Tallie reports doing well on glipizide and reports not as many low BG. She continues to take her BG at home - about 110-130s. She has a BP cuff and still doesn't take it often as it normally runs fine, encouraged her to check it when not at rehab. Today at rehab her BP was 126/74. She continues to take her medications as prescribed with no reported issues. Tiffany Alexander reportsgainign about 3 pounds since leaving for vacation beginning rehab - she continues to work towards losing weight. Alyla reports continuing to do well on her glipizde - she is having minimal lows and if  she needs she has hard candy on hand. She continues to take her BG at home - about 1115-135s. She has a BP cuff and still doesn't take it often as it normally runs fine, again encouraged her to check it when not at rehab. Today at rehab her BP was 132/70. She continues to take her medications as prescribed with no reported issues. Paralee reports her weight has been around 200lbs. She feels that whenever she smiles in the mirror it looks like she had a stroke, no other symptoms - encouraged her to speak with her doctor.   Expected Outcomes Short: continue to take all meds and monitor BP and BG at home. Long: control cardiac risk facotrs with healthy lifestyle changes. ST: continue to take all meds and monitor BP and BG at home. LT: control cardiac risk facotrs with healthy lifestyle changes. ST: continue to take all meds and monitor BP and BG at home. LT: control cardiac risk facotrs with healthy lifestyle changes. ST: continue to take all meds and monitor BP and BG at home. LT: control cardiac risk facotrs with healthy lifestyle changes. ST: continue to take all meds and monitor BP and BG at home. LT: control cardiac risk facotrs with healthy lifestyle changes.    [place] Name 10/21/21 0800             Core Components/Risk Factors/Patient Goals Review   Personal Goals Review Weight Management/Obesity;Diabetes;Hypertension;Lipids       Review Sugars on doing better and pressures are fairly stable.  She continues to hold steady on weight.       Expected Outcomes Continue to monitor risk factors.  Core Components/Risk Factors/Patient Goals at Discharge (Final Review):   Goals and Risk Factor Review - 10/21/21 0800       Core Components/Risk Factors/Patient Goals Review   Personal Goals Review Weight Management/Obesity;Diabetes;Hypertension;Lipids    Review Sugars on doing better and pressures are fairly stable.  She continues to hold steady on weight.    Expected Outcomes Continue to  monitor risk factors.             ITP Comments:  ITP Comments     Row Name 05/14/21 1426 05/22/21 0926 05/27/21 0756 05/28/21 0828 06/25/21 1321   ITP Comments Initial telephone orientation completed. Diagnosis can be found in [facility] 2/27. EP orientation scheduled for Thursday 3/16 at 8am. Completed 6MWT and gym orientation. Initial ITP created and sent for review to Dr. Emily Filbert, Medical Director. First full day of exercise!  Patient was oriented to gym and equipment including functions, settings, policies, and procedures.  Patient's individual exercise prescription and treatment plan were reviewed.  All starting workloads were established based on the results of the 6 minute walk test done at initial orientation visit.  The plan for exercise progression was also introduced and progression will be customized based on patient's performance and goals. 30 Day review completed. Medical Director ITP review done, changes made as directed, and signed approval by Medical Director.   New to program 30 Day review completed. Medical Director ITP review done, changes made as directed, and signed approval by Medical Director.    [place] Name 07/07/21 1403 07/23/21 0835 08/20/21 1226 09/17/21 0807 10/15/21 0709   ITP Comments Completed initial RD consultation 30 Day review completed. Medical Director ITP review done, changes made as directed, and signed approval by Medical Director. 30 Day review completed. Medical Director ITP review done, changes made as directed, and signed approval by Medical Director. 30 Day review completed. Medical Director ITP review done, changes made as directed, and signed approval by Medical Director. 30 Day review completed. Medical Director ITP review done, changes made as directed, and signed approval by Medical Director.    [place] Name 10/23/21 0744           ITP Comments Briyanna graduated today from  rehab with 31 sessions completed.  Details of the patient's exercise prescription  and what She needs to do in order to continue the prescription and progress were discussed with patient.  Patient was given a copy of prescription and goals.  Patient verbalized understanding.  Kylie plans to continue to exercise by getting an exercise bike at home for use.                Comments: Discharge ITP

## 2021-10-23 NOTE — Progress Notes (Signed)
Daily Session Note  Patient Details  Name: Tiffany Alexander MRN: [id number] Date of Birth: 24-May-1945 Referring Provider:   Flowsheet Row Cardiac Rehab from 05/22/2021 in [facility] Cardiac and Pulmonary Rehab  Referring Provider Kathlyn [place] MD       Encounter Date: 10/23/2021  Check In:  Session Check In - 10/23/21 0742       Check-In   Supervising physician immediately available to respond to emergencies See telemetry face sheet for immediately available ER MD    Location [facility]-Cardiac & Pulmonary Rehab    Staff Present Heath Lark, RN, BSN, CCRP;Melissa [place], RDN, Tawanna Solo, MS, ASCM CEP, Exercise Physiologist    Virtual Visit No    Medication changes reported     No    Fall or balance concerns reported    No    Warm-up and Cool-down Performed on first and last piece of equipment    Resistance Training Performed Yes    VAD Patient? No    PAD/SET Patient? No      Pain Assessment   Currently in Pain? No/denies                Social History   Tobacco Use  Smoking Status Never  Smokeless Tobacco Never    Goals Met:  Independence with exercise equipment Exercise tolerated well Personal goals reviewed No report of concerns or symptoms today  Goals Unmet:  Not Applicable  Comments:  Millianna graduated today from  rehab with 31 sessions completed.  Details of the patient's exercise prescription and what She needs to do in order to continue the prescription and progress were discussed with patient.  Patient was given a copy of prescription and goals.  Patient verbalized understanding.  Kachina plans to continue to exercise by getting an exercise bike at home for use.    Dr. Emily Filbert is Medical Director for [place].  Dr. Ottie Glazier is Medical Director for [facility] Pulmonary Rehabilitation.

## 2021-10-30 ENCOUNTER — Other Ambulatory Visit: Payer: Self-pay | Admitting: Cardiovascular Disease

## 2021-11-11 DIAGNOSIS — E118 Type 2 diabetes mellitus with unspecified complications: Secondary | ICD-10-CM | POA: Diagnosis not present

## 2021-11-11 DIAGNOSIS — J8410 Pulmonary fibrosis, unspecified: Secondary | ICD-10-CM | POA: Diagnosis not present

## 2021-11-11 DIAGNOSIS — J449 Chronic obstructive pulmonary disease, unspecified: Secondary | ICD-10-CM | POA: Diagnosis not present

## 2021-11-11 DIAGNOSIS — F4321 Adjustment disorder with depressed mood: Secondary | ICD-10-CM | POA: Diagnosis not present

## 2021-11-11 DIAGNOSIS — I10 Essential (primary) hypertension: Secondary | ICD-10-CM | POA: Diagnosis not present

## 2021-11-11 DIAGNOSIS — Z1389 Encounter for screening for other disorder: Secondary | ICD-10-CM | POA: Diagnosis not present

## 2021-11-11 DIAGNOSIS — Z0000 Encounter for general adult medical examination without abnormal findings: Secondary | ICD-10-CM | POA: Diagnosis not present

## 2021-11-21 ENCOUNTER — Other Ambulatory Visit: Payer: Self-pay

## 2021-11-21 ENCOUNTER — Emergency Department: Payer: Medicare HMO

## 2021-11-21 DIAGNOSIS — Z79899 Other long term (current) drug therapy: Secondary | ICD-10-CM | POA: Insufficient documentation

## 2021-11-21 DIAGNOSIS — Z7902 Long term (current) use of antithrombotics/antiplatelets: Secondary | ICD-10-CM | POA: Diagnosis not present

## 2021-11-21 DIAGNOSIS — W19XXXA Unspecified fall, initial encounter: Secondary | ICD-10-CM | POA: Diagnosis not present

## 2021-11-21 DIAGNOSIS — S0003XA Contusion of scalp, initial encounter: Secondary | ICD-10-CM | POA: Diagnosis not present

## 2021-11-21 DIAGNOSIS — Z7984 Long term (current) use of oral hypoglycemic drugs: Secondary | ICD-10-CM | POA: Insufficient documentation

## 2021-11-21 DIAGNOSIS — Z23 Encounter for immunization: Secondary | ICD-10-CM | POA: Insufficient documentation

## 2021-11-21 DIAGNOSIS — S0001XA Abrasion of scalp, initial encounter: Secondary | ICD-10-CM | POA: Insufficient documentation

## 2021-11-21 DIAGNOSIS — I10 Essential (primary) hypertension: Secondary | ICD-10-CM | POA: Insufficient documentation

## 2021-11-21 DIAGNOSIS — S060X0A Concussion without loss of consciousness, initial encounter: Secondary | ICD-10-CM | POA: Insufficient documentation

## 2021-11-21 DIAGNOSIS — S199XXA Unspecified injury of neck, initial encounter: Secondary | ICD-10-CM | POA: Diagnosis not present

## 2021-11-21 DIAGNOSIS — Z7982 Long term (current) use of aspirin: Secondary | ICD-10-CM | POA: Insufficient documentation

## 2021-11-21 DIAGNOSIS — I2510 Atherosclerotic heart disease of native coronary artery without angina pectoris: Secondary | ICD-10-CM | POA: Diagnosis not present

## 2021-11-21 DIAGNOSIS — S0990XA Unspecified injury of head, initial encounter: Secondary | ICD-10-CM | POA: Diagnosis present

## 2021-11-21 DIAGNOSIS — E119 Type 2 diabetes mellitus without complications: Secondary | ICD-10-CM | POA: Diagnosis not present

## 2021-11-21 NOTE — ED Notes (Signed)
First Nurse Note: Per family, the patient had a mechanical fall tonight at home and hit the back of her head on the floor. Unsure if she had LOC but is on a blood thinner.

## 2021-11-21 NOTE — ED Triage Notes (Signed)
Pt reports mechanical fall at home where she hit her head. Denies h/a, dizziness, vision change, parasthesia at this time. Denies chest pain or SOB. Unknown LOC and pt denies daily thinners. Laceration noted to posterior skull. Bleeding controlled at this time. Pt alert and following commands.

## 2021-11-21 NOTE — ED Notes (Signed)
Pt in CT at this time.

## 2021-11-22 ENCOUNTER — Emergency Department
Admission: EM | Admit: 2021-11-22 | Discharge: 2021-11-22 | Disposition: A | Discharge status: Home / Self Care | Payer: Medicare HMO | Attending: Emergency Medicine | Admitting: Emergency Medicine

## 2021-11-22 DIAGNOSIS — S0990XA Unspecified injury of head, initial encounter: Secondary | ICD-10-CM

## 2021-11-22 DIAGNOSIS — S060X0A Concussion without loss of consciousness, initial encounter: Secondary | ICD-10-CM

## 2021-11-22 DIAGNOSIS — S0001XA Abrasion of scalp, initial encounter: Secondary | ICD-10-CM

## 2021-11-22 MED ORDER — TETANUS-DIPHTH-ACELL PERTUSSIS 5-2.5-18.5 LF-MCG/0.5 IM SUSY
0.5000 mL | PREFILLED_SYRINGE | Freq: Once | INTRAMUSCULAR | Status: AC
  Administered 2021-11-22: 0.5 mL via INTRAMUSCULAR
  Filled 2021-11-22: qty 0.5

## 2021-11-22 MED ORDER — BACITRACIN ZINC 500 UNIT/GM EX OINT
TOPICAL_OINTMENT | Freq: Once | CUTANEOUS | Status: AC
  Administered 2021-11-22: 1 via TOPICAL
  Filled 2021-11-22: qty 0.9

## 2021-11-22 NOTE — ED Provider Notes (Signed)
[facility] Provider Note    Event Date/Time   First MD Initiated Contact with Patient 11/22/21 0122     (approximate)   History   Fall and Head Injury   HPI  Tiffany Alexander is a 76 y.o. female with history of hypertension, diabetes, CAD on Plavix who presents to the emergency department after she fell.  Patient was trying to get into her house and she suddenly fell backwards.  She states that she has had issues with balance before.  No chest pain, shortness of breath, dizziness or palpitations that led to her fall.  No loss of consciousness.  Has an abrasion to the posterior scalp.  Unclear if she has had a tetanus vaccine recently.  Denies any other injury.  No neck or back pain.  No numbness, tingling or weakness.  Daughter reports she has had some "loss of her short-term memory" but states she has been improving.   History provided by patient and daughter.    Past Medical History:  Diagnosis Date   Arthritis    osteoarthritis   Carpal tunnel syndrome of left wrist    Depression    Diabetes mellitus without complication (HCC)    Diverticulosis    GERD (gastroesophageal reflux disease)    Hypertension    PONV (postoperative nausea and vomiting)    PONV (postoperative nausea and vomiting)    Pulmonary fibrosis (HCC)    Recurrent UTI    SUI (stress urinary incontinence, female)    Wears dentures    full upper and lower    Past Surgical History:  Procedure Laterality Date    endocervical curettage     ABDOMINAL HYSTERECTOMY     BLADDER SUSPENSION     CARPAL TUNNEL RELEASE Left    CARPAL TUNNEL RELEASE Right 05/14/2015   Procedure: CARPAL TUNNEL RELEASE;  Surgeon: Hessie Knows, MD;  Location: [facility];  Service: Orthopedics;  Laterality: Right;   CATARACT EXTRACTION W/PHACO Right 12/17/2015   Procedure: CATARACT EXTRACTION PHACO AND INTRAOCULAR LENS PLACEMENT (IOC);  Surgeon: Birder Robson, MD;  Location: [facility];  Service: Ophthalmology;   Laterality: Right;  [place] 52.0 AP% 15.4 CDE 7.99 Fluid pack lot # [id number]   CATARACT EXTRACTION W/PHACO Left 01/21/2016   Procedure: CATARACT EXTRACTION PHACO AND INTRAOCULAR LENS PLACEMENT (IOC);  Surgeon: Birder Robson, MD;  Location: [facility];  Service: Ophthalmology;  Laterality: Left;  [place] 50.4 AP% 19.6 CDE 9.92 FLUID PACK LOT # [id number]   CESAREAN SECTION N/A    COLONOSCOPY N/A 08/24/2014   Procedure: COLONOSCOPY;  Surgeon: Lollie Sails, MD;  Location: [facility];  Service: Endoscopy;  Laterality: N/A;   COLONOSCOPY N/A 03/13/2020   Procedure: COLONOSCOPY;  Surgeon: Toledo, Benay Pike, MD;  Location: [facility] ENDOSCOPY;  Service: Gastroenterology;  Laterality: N/A;   COLONOSCOPY WITH PROPOFOL N/A 04/05/2015   Procedure: COLONOSCOPY WITH PROPOFOL;  Surgeon: Lollie Sails, MD;  Location: [facility] END[facility]COPY;  Service: Endoscopy;  Laterality: N/A;   COLPORRHAPHY     CORONARY STENT INTERVENTION N/A 05/05/2021   Procedure: CORONARY STENT INTERVENTION;  Surgeon: Wellington Hampshire, MD;  Location: [place] [facility];  Service: Cardiovascular;  Laterality: N/A;   EYE SURGERY     FINGER ARTHRODESIS Right 07/12/2014   Procedure: Right middle finger DIP fussion;  Surgeon: Hessie Knows, MD;  Location: [facility];  Service: Orthopedics;  Laterality: Right;   FRACTURE SURGERY     fractured wrist Right    HAMMER TOE SURGERY Left 04/01/2016  Procedure: HAMMER TOE CORRECTION  Left 2nd  & 3rd toe;  Surgeon: [name], DPM;  Location: [facility];  Service: Podiatry;  Laterality: Left;  Special:  Hammer lock implants Left 2nd & 3rd IVA LOcal Diabetic - oral meds   LEFT HEART CATH AND CORONARY ANGIOGRAPHY Left 05/05/2021   Procedure: LEFT HEART CATH AND CORONARY ANGIOGRAPHY;  Surgeon: Wellington Hampshire, MD;  Location: [facility];  Service: Cardiovascular;  Laterality: Left;   OPEN REDUCTION INTERNAL FIXATION (ORIF) METACARPAL Left 05/14/2015   Procedure: OPEN REDUCTION INTERNAL  FIXATION (ORIF) METACARPAL;  Surgeon: Hessie Knows, MD;  Location: [facility];  Service: Orthopedics;  Laterality: Left;   SEPTOPLASTY     TUBAL LIGATION      MEDICATIONS:  Prior to Admission medications   Medication Sig Start Date End Date Taking? Authorizing Provider  albuterol (VENTOLIN HFA) 108 (90 Base) MCG/ACT inhaler 2 puffs Q4H PRN 03/27/21   Tyler Pita, MD  amLODipine (NORVASC) 10 MG tablet Take 10 mg by mouth at bedtime.    [provider]  ascorbic acid (VITAMIN C) 500 MG tablet Take 500 mg by mouth daily.    [provider]  aspirin 81 MG chewable tablet Chew 1 tablet (81 mg total) by mouth daily. 08/12/21   Minna Merritts, MD  budesonide (PULMICORT) 0.25 MG/2ML nebulizer solution Take 2 mLs (0.25 mg total) by nebulization 2 (two) times daily. 12/05/20   Tyler Pita, MD  calcium citrate-vitamin D (CITRACAL+D) 315-200 MG-UNIT tablet Take 1 tablet by mouth 2 (two) times daily.    [provider]  carvedilol (COREG) 25 MG tablet TAKE 1 TABLET TWICE DAILY (DOSE INCREASE) 10/30/21   Minna Merritts, MD  cephALEXin (KEFLEX) 250 MG capsule Take 250 mg by mouth daily. As needed for UTI 06/12/21   [provider]  Cholecalciferol (VITAMIN D3) 1000 units CAPS Take by mouth daily.    [provider]  citalopram (CELEXA) 20 MG tablet Take by mouth. 09/26/20   [provider]  clopidogrel (PLAVIX) 75 MG tablet Take 1 tablet (75 mg total) by mouth daily with breakfast. 05/21/21   Furth, Cadence H, PA-C  Cyanocobalamin (VITAMIN B 12 PO) Take 1 tablet by mouth daily.    [provider]  diphenhydrAMINE (BENADRYL) 25 MG tablet Take 75 mg by mouth daily as needed for allergies.    [provider]  diphenoxylate-atropine (LOMOTIL) 2.5-0.025 MG tablet Take 1 tablet by mouth 2 (two) times daily as needed for diarrhea or loose stools. 04/15/21   [provider]  fluticasone (FLONASE) 50 MCG/ACT nasal spray Place 2  sprays into both nostrils 2 (two) times daily.    [provider]  formoterol (PERFOROMIST) 20 MCG/2ML nebulizer solution USE ONE VIAL VIA NEBULIZER AND INHALE BY MOUTH TWICE DAILY 10/13/21   Tyler Pita, MD  furosemide (LASIX) 20 MG tablet Take 20 mg by mouth daily.    [provider]  glipiZIDE (GLUCOTROL XL) 5 MG 24 hr tablet Take 5 mg by mouth daily with breakfast.    [provider]  hydrALAZINE (APRESOLINE) 50 MG tablet TAKE 1 TABLET BY MOUTH 3 TIMES DAILY 10/22/21   Minna Merritts, MD  hydrOXYzine (ATARAX/VISTARIL) 10 MG tablet Take 10 mg by mouth every 8 (eight) hours as needed for itching. 08/23/20   [provider]  Lansoprazole (PREVACID PO) Take by mouth.    [provider]  losartan (COZAAR) 100 MG tablet Take 100 mg  by mouth at bedtime.    [provider]  Melatonin 10 MG CAPS Take 20 mg by mouth at bedtime.    [provider]  Multiple Vitamins-Minerals (WOMENS MULTIVITAMIN PO) Take 1 tablet by mouth daily.    [provider]  ondansetron (ZOFRAN-ODT) 4 MG disintegrating tablet Take 1 tablet (4 mg total) by mouth every 8 (eight) hours as needed for nausea or vomiting. 05/06/21   Rise Mu, PA-C  Probiotic Product (PROBIOTIC BLEND PO) Take 1 capsule by mouth daily.    [provider]  rosuvastatin (CRESTOR) 20 MG tablet Take 1 tablet (20 mg total) by mouth at bedtime. 05/06/21   Dunn, Areta Haber, PA-C  traMADol (ULTRAM) 50 MG tablet Take 50 mg by mouth 3 (three) times daily as needed for moderate pain. 08/13/20   [provider]  traZODone (DESYREL) 150 MG tablet Take 150 mg by mouth at bedtime. 09/26/20   [provider]    Physical Exam   Triage Vital Signs: ED Triage Vitals [11/21/21 2229]  Enc Vitals Group     BP (!) 171/79     Pulse Rate 73     Resp 18     Temp 98.4 F (36.9 C)     Temp Source Oral     SpO2 95 %     Weight 200 lb (90.7 kg)     Height '5\' 3"'$  (1.6 m)      Head Circumference      Peak Flow      Pain Score 0     Pain Loc      Pain Edu?      Excl. in [place]?     Most recent vital signs: Vitals:   11/21/21 2229 11/22/21 0217  BP: (!) 171/79 (!) 154/63  Pulse: 73 73  Resp: 18 16  Temp: 98.4 F (36.9 C)   SpO2: 95% 96%     CONSTITUTIONAL: Alert and oriented to person place and situation but disoriented to time.  Elderly. HEAD: Normocephalic; superficial abrasion to the posterior scalp with hematoma EYES: Conjunctivae clear, PERRL, EOMI ENT: normal nose; no rhinorrhea; moist mucous membranes; pharynx without lesions noted; no dental injury; no septal hematoma, no epistaxis; no facial deformity or bony tenderness NECK: Supple, no midline spinal tenderness, step-off or deformity; trachea midline CARD: RRR; S1 and S2 appreciated; no murmurs, no clicks, no rubs, no gallops RESP: Normal chest excursion without splinting or tachypnea; breath sounds clear and equal bilaterally; no wheezes, no rhonchi, no rales; no hypoxia or respiratory distress CHEST:  chest wall stable, no crepitus or ecchymosis or deformity, nontender to palpation; no flail chest ABD/GI: Normal bowel sounds; non-distended; soft, non-tender, no rebound, no guarding; no ecchymosis or other lesions noted PELVIS:  stable, nontender to palpation BACK:  The back appears normal; no midline spinal tenderness, step-off or deformity EXT: Normal ROM in all joints; non-tender to palpation; no edema; normal capillary refill; no cyanosis, no bony tenderness or bony deformity of patient's extremities, no joint effusion, compartments are soft, extremities are warm and well-perfused, no ecchymosis SKIN: Normal color for age and race; warm NEURO: No facial asymmetry, normal speech, moving all extremities equally, ambulates with assistance without difficulty, normal sensation diffusely  ED Results / Procedures / Treatments   LABS: (all labs ordered are listed, but only abnormal results are  displayed) Labs Reviewed - No data to display   EKG:    RADIOLOGY: My personal review and interpretation of imaging: CT head and  cervical spine show no traumatic injury other than soft tissue injury.  I have personally reviewed all radiology reports. CT HEAD WO CONTRAST (5MM)  Result Date: 11/21/2021 CLINICAL DATA:  Head trauma, moderate-severe fall; Neck trauma (Age >= 65y) fall EXAM: CT HEAD WITHOUT CONTRAST CT CERVICAL SPINE WITHOUT CONTRAST TECHNIQUE: Multidetector CT imaging of the head and cervical spine was performed following the standard protocol without intravenous contrast. Multiplanar CT image reconstructions of the cervical spine were also generated. RADIATION DOSE REDUCTION: This exam was performed according to the departmental dose-optimization program which includes automated exposure control, adjustment of the mA and/or kV according to patient size and/or use of iterative reconstruction technique. COMPARISON:  None Available. FINDINGS: CT HEAD FINDINGS BRAIN: BRAIN Cerebral ventricle sizes are concordant with the degree of cerebral volume loss. Patchy and confluent areas of decreased attenuation are noted throughout the deep and periventricular white matter of the cerebral hemispheres bilaterally, compatible with chronic microvascular ischemic disease. No evidence of large-territorial acute infarction. No parenchymal hemorrhage. No mass lesion. No extra-axial collection. No mass effect or midline shift. No hydrocephalus. Basilar cisterns are patent. Vascular: No hyperdense vessel. Atherosclerotic calcifications are present within the cavernous internal carotid and vertebral arteries. Skull: No acute fracture or focal lesion. Sinuses/Orbits: Paranasal sinuses and mastoid air cells are clear. Bilateral lens replacement. Otherwise the orbits are unremarkable. Other: Right parieto-occipital scalp 7 mm hematoma formation. CT CERVICAL SPINE FINDINGS Alignment: Normal. Skull base and  vertebrae: Multilevel moderate degenerative changes of the spine. No associated severe osseous neural foraminal stenosis at the right C4-C5 level. No severe osseous central canal stenosis. No acute fracture. No aggressive appearing focal osseous lesion or focal pathologic process. Soft tissues and spinal canal: No prevertebral fluid or swelling. No visible canal hematoma. Upper chest: Unremarkable. Other: Atherosclerotic plaque. IMPRESSION: 1. No acute intracranial abnormality. 2. No acute displaced fracture or traumatic listhesis of the cervical spine. 3. A 7 mm right parieto-occipital scalp hematoma. 4. Multilevel moderate degenerative changes of the spine. No associated severe osseous neural foraminal stenosis at the right C4-C5 level. Electronically Signed   By: Iven Finn M.D.   On: 11/21/2021 22:33   CT Cervical Spine Wo Contrast  Result Date: 11/21/2021 CLINICAL DATA:  Head trauma, moderate-severe fall; Neck trauma (Age >= 65y) fall EXAM: CT HEAD WITHOUT CONTRAST CT CERVICAL SPINE WITHOUT CONTRAST TECHNIQUE: Multidetector CT imaging of the head and cervical spine was performed following the standard protocol without intravenous contrast. Multiplanar CT image reconstructions of the cervical spine were also generated. RADIATION DOSE REDUCTION: This exam was performed according to the departmental dose-optimization program which includes automated exposure control, adjustment of the mA and/or kV according to patient size and/or use of iterative reconstruction technique. COMPARISON:  None Available. FINDINGS: CT HEAD FINDINGS BRAIN: BRAIN Cerebral ventricle sizes are concordant with the degree of cerebral volume loss. Patchy and confluent areas of decreased attenuation are noted throughout the deep and periventricular white matter of the cerebral hemispheres bilaterally, compatible with chronic microvascular ischemic disease. No evidence of large-territorial acute infarction. No parenchymal hemorrhage.  No mass lesion. No extra-axial collection. No mass effect or midline shift. No hydrocephalus. Basilar cisterns are patent. Vascular: No hyperdense vessel. Atherosclerotic calcifications are present within the cavernous internal carotid and vertebral arteries. Skull: No acute fracture or focal lesion. Sinuses/Orbits: Paranasal sinuses and mastoid air cells are clear. Bilateral lens replacement. Otherwise the orbits are unremarkable. Other: Right parieto-occipital scalp 7 mm hematoma formation. CT CERVICAL SPINE FINDINGS Alignment: Normal. Skull base and  vertebrae: Multilevel moderate degenerative changes of the spine. No associated severe osseous neural foraminal stenosis at the right C4-C5 level. No severe osseous central canal stenosis. No acute fracture. No aggressive appearing focal osseous lesion or focal pathologic process. Soft tissues and spinal canal: No prevertebral fluid or swelling. No visible canal hematoma. Upper chest: Unremarkable. Other: Atherosclerotic plaque. IMPRESSION: 1. No acute intracranial abnormality. 2. No acute displaced fracture or traumatic listhesis of the cervical spine. 3. A 7 mm right parieto-occipital scalp hematoma. 4. Multilevel moderate degenerative changes of the spine. No associated severe osseous neural foraminal stenosis at the right C4-C5 level. Electronically Signed   By: Iven Finn M.D.   On: 11/21/2021 22:33     PROCEDURES:  Critical Care performed: No    Procedures    IMPRESSION / MDM / ASSESSMENT AND PLAN / ED COURSE  I reviewed the triage vital signs and the nursing notes.  Patient here after what sounds like a mechanical fall.  She has scalp abrasion.     DIFFERENTIAL DIAGNOSIS (includes but not limited to):   Loss of balance, head injury, concussion, intracranial hemorrhage  Patient's presentation is most consistent with acute presentation with potential threat to life or bodily function.  PLAN: CT head and cervical spine obtained from  triage.  Daughter is concerned about the "loss of short-term memory".  Discussed that this is likely postconcussive.  Initially patient alert and oriented but could not tell me the month or year appropriately.  We will clean her wounds, allow her to eat and drink and ambulate her.  Will monitor and then reassess.  No other focal neurologic deficits.   MEDICATIONS GIVEN IN ED: Medications  Tdap (BOOSTRIX) injection 0.5 mL (0.5 mLs Intramuscular Given 11/22/21 0216)  bacitracin ointment (1 Application Topical Given 11/22/21 0217)     ED COURSE: Patient now A&O x4.  Ambulates with steady gait.  Tolerating p.o.  Requesting discharge home.  Discussed wound care instructions and return precautions.  No wounds on the scalp that need laceration repair.  Recommended Tylenol as needed for pain.  Discussed head injury return precautions and postconcussive supportive care instructions.  At this time, I do not feel there is any life-threatening condition present. I reviewed all nursing notes, vitals, pertinent previous records.  All lab and urine results, EKGs, imaging ordered have been independently reviewed and interpreted by myself.  I reviewed all available radiology reports from any imaging ordered this visit.  Based on my assessment, I feel the patient is safe to be discharged home without further emergent workup and can continue workup as an outpatient as needed. Discussed all findings, treatment plan as well as usual and customary return precautions.  They verbalize understanding and are comfortable with this plan.  Outpatient follow-up has been provided as needed.  All questions have been answered.    CONSULTS:  none   OUTSIDE RECORDS REVIEWED: Reviewed patient's last office visit with [place] Pink on 11/11/2021.       FINAL CLINICAL IMPRESSION(S) / ED DIAGNOSES   Final diagnoses:  Injury of head, initial encounter  Concussion without loss of consciousness, initial encounter  Abrasion of  scalp, initial encounter     Rx / DC Orders   ED Discharge Orders     None        Note:  This document was prepared using Dragon voice recognition software and may include unintentional dictation errors.   Mckena Chern, Delice Bison, DO 11/22/21 [phone]

## 2021-11-22 NOTE — ED Notes (Signed)
Wound to posterior aspect of skull cleansed. Abrasion noted without active bleeding.

## 2021-11-22 NOTE — Discharge Instructions (Signed)
You have had a head injury resulting in a concussion.  A concussion is a clinical diagnosis and not seen on imaging (CT, MRI).  Please avoid alcohol, sedatives for the next week.  Please rest and drink plenty of water.  We recommend that you avoid any activity that may lead to another head injury for at least 1 week or until your symptoms have completely resolved.  We also recommend "brain rest" to ensure the best possible long term outcomes - please avoid TV, cell phones, tablets, computers as much as possible for the next 48 hours.     You may use Tylenol 1000 mg every 6 hours as needed for pain.   You may clean the wound your scalp gently with warm soap and water.  You do not need any stitches today.  This area may ooze blood so you may want to sleep with a towel over your pillow.

## 2021-11-27 ENCOUNTER — Other Ambulatory Visit: Payer: Self-pay | Admitting: Cardiovascular Disease

## 2021-12-05 ENCOUNTER — Ambulatory Visit: Payer: Medicare HMO | Admitting: Pulmonary Disease

## 2021-12-06 DIAGNOSIS — R300 Dysuria: Secondary | ICD-10-CM | POA: Diagnosis not present

## 2021-12-06 DIAGNOSIS — R350 Frequency of micturition: Secondary | ICD-10-CM | POA: Diagnosis not present

## 2021-12-16 NOTE — Progress Notes (Signed)
Cardiology Office Note  Date:  12/17/2021   ID:  BRINLEE GAMBRELL, DOB September 10, 1945, MRN [id number]  PCP:  Maryland Pink, MD   Chief Complaint  Patient presents with   Follow-up    6 month F/U-No new cardiac concerns    HPI:  [name] is a 76 year old woman with past medical history of asthma Hypertension Diabetes Coronary calcification on CT scan October 2022 mild pulmonary fibrosis Movement disorder, Morbid obesity OSA on oxygen cardiac catheterization May 05, 2021, Stent placed to ostial RCA Who presents for f/u of her bradycardia, shortness of breath, coronary calcification  LOV 06/2021  Son  with her today Overall reports that she feels well Weigth down today 211 last year, today is 185 pounds BP elevated on today's visit, slight improvement on recheck Has not been checking blood pressure at home Denies chest pain or shortness of breath concerning for angina No regular exercise program  Thinks that she may have run out of her Crestor for a period of time Prior cholesterol 140, most recent recheck more than 200  Other lab work reviewed A1c 6.1  EKG personally reviewed by myself on todays visit Sinus bradycardia rate 54 bpm poor R wave progression to the anterior precordial leads left axis deviation  Other past medical history reviewed Cardiac CTA: April 24, 2021 bilateral chronic appearing peripheral predominant interstitial reticulation which may reflect sequelae of chronic interstitial lung disease -- Calcium score 540 Severe ostial RCA disease   PMH:   has a past medical history of Arthritis, Carpal tunnel syndrome of left wrist, Depression, Diabetes mellitus without complication ([place]), Diverticulosis, GERD (gastroesophageal reflux disease), Hypertension, PONV (postoperative nausea and vomiting), PONV (postoperative nausea and vomiting), Pulmonary fibrosis ([place]), Recurrent UTI, SUI (stress urinary incontinence, female), and Wears  dentures.  PSH:    Past Surgical History:  Procedure Laterality Date    endocervical curettage     ABDOMINAL HYSTERECTOMY     BLADDER SUSPENSION     CARPAL TUNNEL RELEASE Left    CARPAL TUNNEL RELEASE Right 05/14/2015   Procedure: CARPAL TUNNEL RELEASE;  Surgeon: Hessie Knows, MD;  Location: [facility]S;  Service: Orthopedics;  Laterality: Right;   CATARACT EXTRACTION W/PHACO Right 12/17/2015   Procedure: CATARACT EXTRACTION PHACO AND INTRAOCULAR LENS PLACEMENT (IOC);  Surgeon: Birder Robson, MD;  Location: [facility];  Service: Ophthalmology;  Laterality: Right;  [place] 52.0 AP% 15.4 CDE 7.99 Fluid pack lot # [id number]   CATARACT EXTRACTION W/PHACO Left 01/21/2016   Procedure: CATARACT EXTRACTION PHACO AND INTRAOCULAR LENS PLACEMENT (IOC);  Surgeon: Birder Robson, MD;  Location: [facility];  Service: Ophthalmology;  Laterality: Left;  [place] 50.4 AP% 19.6 CDE 9.92 FLUID PACK LOT # [id number]   CESAREAN SECTION N/A    COLONOSCOPY N/A 08/24/2014   Procedure: COLONOSCOPY;  Surgeon: Lollie Sails, MD;  Location: [facility] ENDOSCOPY;  Service: Endoscopy;  Laterality: N/A;   COLONOSCOPY N/A 03/13/2020   Procedure: COLONOSCOPY;  Surgeon: Toledo, Benay Pike, MD;  Location: [facility] ENDOSCOPY;  Service: Gastroenterology;  Laterality: N/A;   COLONOSCOPY WITH PROPOFOL N/A 04/05/2015   Procedure: COLONOSCOPY WITH PROPOFOL;  Surgeon: Lollie Sails, MD;  Location: [facility] ENDOSCOPY;  Service: Endoscopy;  Laterality: N/A;   COLPORRHAPHY     CORONARY STENT INTERVENTION N/A 05/05/2021   Procedure: CORONARY STENT INTERVENTION;  Surgeon: Wellington Hampshire, MD;  Location: [place] CV LAB;  Service: Cardiovascular;  Laterality: N/A;   EYE SURGERY     FINGER ARTHRODESIS Right 07/12/2014   Procedure: Right middle  finger DIP fussion;  Surgeon: Hessie Knows, MD;  Location: [facility];  Service: Orthopedics;  Laterality: Right;   FRACTURE SURGERY     fractured wrist Right    HAMMER TOE SURGERY Left 04/01/2016   Procedure:  HAMMER TOE CORRECTION  Left 2nd  & 3rd toe;  Surgeon: [name], DPM;  Location: [place];  Service: Podiatry;  Laterality: Left;  Special:  Hammer lock implants Left 2nd & 3rd IVA LOcal Diabetic - oral meds   LEFT HEART CATH AND CORONARY ANGIOGRAPHY Left 05/05/2021   Procedure: LEFT HEART CATH AND CORONARY ANGIOGRAPHY;  Surgeon: Wellington Hampshire, MD;  Location: [place] [facility];  Service: Cardiovascular;  Laterality: Left;   OPEN REDUCTION INTERNAL FIXATION (ORIF) METACARPAL Left 05/14/2015   Procedure: OPEN REDUCTION INTERNAL FIXATION (ORIF) METACARPAL;  Surgeon: Hessie Knows, MD;  Location: [facility];  Service: Orthopedics;  Laterality: Left;   SEPTOPLASTY     TUBAL LIGATION      Current Outpatient Medications  Medication Sig Dispense Refill   albuterol (VENTOLIN HFA) 108 (90 Base) MCG/ACT inhaler 2 puffs Q4H PRN 18 g 0   amLODipine (NORVASC) 10 MG tablet Take 10 mg by mouth at bedtime.     ascorbic acid (VITAMIN C) 500 MG tablet Take 500 mg by mouth daily.     aspirin 81 MG chewable tablet Chew 1 tablet (81 mg total) by mouth daily. 90 tablet 0   budesonide (PULMICORT) 0.25 MG/2ML nebulizer solution Take 2 mLs (0.25 mg total) by nebulization 2 (two) times daily. 120 mL 6   calcium citrate-vitamin D (CITRACAL+D) 315-200 MG-UNIT tablet Take 1 tablet by mouth 2 (two) times daily.     carvedilol (COREG) 25 MG tablet TAKE 1 TABLET TWICE DAILY (DOSE INCREASE) 180 tablet 0   cephALEXin (KEFLEX) 250 MG capsule Take 250 mg by mouth daily. As needed for UTI     Cholecalciferol (VITAMIN D3) 1000 units CAPS Take by mouth daily.     citalopram (CELEXA) 20 MG tablet Take 20 mg by mouth daily.     clopidogrel (PLAVIX) 75 MG tablet Take 1 tablet (75 mg total) by mouth daily with breakfast. 90 tablet 3   Cyanocobalamin (VITAMIN B 12 PO) Take 1 tablet by mouth daily.     diphenhydrAMINE (BENADRYL) 25 MG tablet Take 75 mg by mouth daily as needed for allergies.     diphenoxylate-atropine  (LOMOTIL) 2.5-0.025 MG tablet Take 1 tablet by mouth 2 (two) times daily as needed for diarrhea or loose stools.     fluticasone (FLONASE) 50 MCG/ACT nasal spray Place 2 sprays into both nostrils 2 (two) times daily.     formoterol (PERFOROMIST) 20 MCG/2ML nebulizer solution USE ONE VIAL VIA NEBULIZER AND INHALE BY MOUTH TWICE DAILY 120 mL 11   furosemide (LASIX) 20 MG tablet Take 20 mg by mouth daily.     glipiZIDE (GLUCOTROL XL) 5 MG 24 hr tablet Take 5 mg by mouth daily with breakfast.     hydrALAZINE (APRESOLINE) 50 MG tablet TAKE 1 TABLET BY MOUTH 3 TIMES DAILY 90 tablet 0   hydrOXYzine (ATARAX/VISTARIL) 10 MG tablet Take 10 mg by mouth every 8 (eight) hours as needed for itching.     Lansoprazole (PREVACID PO) Take by mouth.     losartan (COZAAR) 100 MG tablet Take 100 mg by mouth at bedtime.     Melatonin 10 MG CAPS Take 20 mg by mouth at bedtime.     Multiple Vitamins-Minerals (WOMENS MULTIVITAMIN PO) Take  1 tablet by mouth daily.     ondansetron (ZOFRAN-ODT) 4 MG disintegrating tablet Take 1 tablet (4 mg total) by mouth every 8 (eight) hours as needed for nausea or vomiting. 20 tablet 0   Probiotic Product (PROBIOTIC BLEND PO) Take 1 capsule by mouth daily.     rosuvastatin (CRESTOR) 20 MG tablet Take 1 tablet (20 mg total) by mouth at bedtime. 30 tablet 5   traMADol (ULTRAM) 50 MG tablet Take 50 mg by mouth 3 (three) times daily as needed for moderate pain.     traZODone (DESYREL) 150 MG tablet Take 150 mg by mouth at bedtime.     No current facility-administered medications for this visit.   Facility-Administered Medications Ordered in Other Visits  Medication Dose Route Frequency Provider Last Rate Last Admin   albuterol (PROVENTIL) (2.5 MG/3ML) 0.083% nebulizer solution 2.5 mg  2.5 mg Nebulization Once Wilhelmina Mcardle, MD        Allergies:   Codeine, Erythromycin, Nitrofurantoin, and Penicillins   Social History:  The patient  reports that she has never smoked. She has never  used smokeless tobacco. She reports that she does not drink alcohol and does not use drugs.   Family History:   family history includes Breast cancer in her maternal aunt; Breast cancer (age of onset: 40) in her paternal grandmother; Cancer in her father.   Review of Systems: Review of Systems  Constitutional: Negative.   HENT: Negative.    Respiratory:  Positive for shortness of breath.   Cardiovascular: Negative.   Gastrointestinal: Negative.   Musculoskeletal: Negative.   Neurological: Negative.   Psychiatric/Behavioral: Negative.    All other systems reviewed and are negative.   PHYSICAL EXAM: VS:  BP (!) 150/66 (BP Location: Right Arm, Patient Position: Sitting, Cuff Size: Normal) Comment (BP Location): lower arm per pt request  Pulse (!) 54   Ht '5\' 1"'$  (1.549 m)   Wt 185 lb (83.9 kg)   SpO2 94%   BMI 34.96 kg/m  , BMI Body mass index is 34.96 kg/m. Constitutional:  oriented to person, place, and time. No distress.  HENT:  Head: Grossly normal Eyes:  no discharge. No scleral icterus.  Neck: No JVD, no carotid bruits  Cardiovascular: Regular rate and rhythm, no murmurs appreciated Pulmonary/Chest: Clear to auscultation bilaterally, no wheezes or rails Abdominal: Soft.  no distension.  no tenderness.  Musculoskeletal: Normal range of motion Neurological:  normal muscle tone. Coordination normal. No atrophy Skin: Skin warm and dry Psychiatric: normal affect, pleasant  Recent Labs: 01/27/2021: TSH 2.271 05/06/2021: BUN 15; Creatinine, Ser 0.57; Hemoglobin 11.9; Platelets 167; Potassium 3.8; Sodium 141    Lipid Panel No results found for: "CHOL", "HDL", "LDLCALC", "TRIG"    Wt Readings from Last 3 Encounters:  12/17/21 185 lb (83.9 kg)  11/21/21 200 lb (90.7 kg)  10/21/21 197 lb 14.4 oz (89.8 kg)     ASSESSMENT AND PLAN:  Problem List Items Addressed This Visit       Cardiology Problems   Coronary artery disease involving native coronary artery of native  heart - Primary   Hyperlipidemia, mixed   Chronic venous insufficiency     Other   Pulmonary fibrosis ([place])   Other Visit Diagnoses     Essential hypertension       Precordial pain         Coronary disease with stable angina Stenosis ostial RCA found on CT scan February 2023 Had stent placed,  Currently with no symptoms  of angina. No further workup at this time. Continue current medication regimen. Stressed importance of taking her Crestor  Shortness of breath Asthma, fibrosis, followed by pulmonary She reports symptoms are stable Encouraged that weight is trending down  Bradycardia Heart rate stable, asymptomatic  Essential hypertension Blood pressure running high even on recheck Recommend she closely monitor blood pressure numbers at home and call us with measurements If blood pressure continues to run high could increase dose of hydralazine  Diabetes type 2 A1c well controlled, weight trending downward  Hyperlipidemia Recent total cholesterol more than 200, up from 140 Stressed importance of taking Crestor daily    Total encounter time more than 30 minutes  Greater than 50% was spent in counseling and coordination of care with the patient    Signed, Esmond Plants, M.D., Ph.D. [place], [place]

## 2021-12-17 ENCOUNTER — Encounter: Payer: Self-pay | Admitting: Cardiovascular Disease

## 2021-12-17 ENCOUNTER — Ambulatory Visit: Payer: Medicare HMO | Attending: Cardiovascular Disease | Admitting: Cardiovascular Disease

## 2021-12-17 VITALS — BP 140/80 | HR 54 | Ht 61.0 in | Wt 185.0 lb

## 2021-12-17 DIAGNOSIS — J8410 Pulmonary fibrosis, unspecified: Secondary | ICD-10-CM

## 2021-12-17 DIAGNOSIS — I25118 Atherosclerotic heart disease of native coronary artery with other forms of angina pectoris: Secondary | ICD-10-CM | POA: Diagnosis not present

## 2021-12-17 DIAGNOSIS — E782 Mixed hyperlipidemia: Secondary | ICD-10-CM | POA: Diagnosis not present

## 2021-12-17 DIAGNOSIS — I10 Essential (primary) hypertension: Secondary | ICD-10-CM | POA: Diagnosis not present

## 2021-12-17 DIAGNOSIS — R072 Precordial pain: Secondary | ICD-10-CM

## 2021-12-17 DIAGNOSIS — I872 Venous insufficiency (chronic) (peripheral): Secondary | ICD-10-CM | POA: Diagnosis not present

## 2021-12-17 NOTE — Patient Instructions (Addendum)
Please monitor blood pressure Goal BP 130 range on the top number Please call with numbers   Medication Instructions:  Please make sure you are take rosuvasatin  If you need a refill on your cardiac medications before your next appointment, please call your pharmacy.    Lab work: No new labs needed   Testing/Procedures: No new testing needed   Follow-Up: At [facility], you and your health needs are our priority.  As part of our continuing mission to provide you with exceptional heart care, we have created designated Provider Care Teams.  These Care Teams include your primary Cardiologist (physician) and Advanced Practice Providers (APPs -  Physician Assistants and Nurse Practitioners) who all work together to provide you with the care you need, when you need it.  You will need a follow up appointment in 12 months  Providers on your designated Care Team:   Murray Hodgkins, NP Christell Faith, PA-C Cadence Kathlen Mody, [place]  COVID-19 Vaccine Information can be found at: [email or web address] For questions related to vaccine distribution or appointments, please email [email or web address] or call [phone].

## 2021-12-23 DIAGNOSIS — I2510 Atherosclerotic heart disease of native coronary artery without angina pectoris: Secondary | ICD-10-CM | POA: Diagnosis not present

## 2021-12-23 DIAGNOSIS — N390 Urinary tract infection, site not specified: Secondary | ICD-10-CM | POA: Diagnosis not present

## 2021-12-23 DIAGNOSIS — R2689 Other abnormalities of gait and mobility: Secondary | ICD-10-CM | POA: Diagnosis not present

## 2021-12-23 DIAGNOSIS — F4321 Adjustment disorder with depressed mood: Secondary | ICD-10-CM | POA: Diagnosis not present

## 2021-12-23 DIAGNOSIS — I10 Essential (primary) hypertension: Secondary | ICD-10-CM | POA: Diagnosis not present

## 2021-12-23 DIAGNOSIS — J8410 Pulmonary fibrosis, unspecified: Secondary | ICD-10-CM | POA: Diagnosis not present

## 2021-12-23 DIAGNOSIS — G609 Hereditary and idiopathic neuropathy, unspecified: Secondary | ICD-10-CM | POA: Diagnosis not present

## 2021-12-30 ENCOUNTER — Other Ambulatory Visit: Payer: Self-pay | Admitting: Cardiovascular Disease

## 2022-01-01 DIAGNOSIS — E119 Type 2 diabetes mellitus without complications: Secondary | ICD-10-CM | POA: Diagnosis not present

## 2022-01-01 DIAGNOSIS — H26491 Other secondary cataract, right eye: Secondary | ICD-10-CM | POA: Diagnosis not present

## 2022-01-05 ENCOUNTER — Ambulatory Visit: Payer: Medicare HMO | Admitting: Urology

## 2022-01-05 ENCOUNTER — Encounter: Payer: Self-pay | Admitting: Urology

## 2022-01-05 DIAGNOSIS — N3020 Other chronic cystitis without hematuria: Secondary | ICD-10-CM | POA: Diagnosis not present

## 2022-01-05 LAB — MICROSCOPIC EXAMINATION

## 2022-01-05 LAB — URINALYSIS, COMPLETE
Bilirubin, UA: NEGATIVE
Glucose, UA: NEGATIVE
Ketones, UA: NEGATIVE
Leukocytes,UA: NEGATIVE
Nitrite, UA: NEGATIVE
Protein,UA: NEGATIVE
RBC, UA: NEGATIVE
Specific Gravity, UA: 1.015 (ref 1.005–1.030)
Urobilinogen, Ur: 0.2 mg/dL (ref 0.2–1.0)
pH, UA: 5.5 (ref 5.0–7.5)

## 2022-01-05 MED ORDER — CEPHALEXIN 250 MG PO CAPS: 250.0000 mg | ORAL_CAPSULE | Freq: Every day | ORAL | 11 refills | Status: DC

## 2022-01-05 NOTE — Addendum Note (Signed)
Addended by: Despina Hidden on: 01/05/2022 11:08 AM   Modules accepted: Orders

## 2022-01-05 NOTE — Addendum Note (Signed)
Addended by: Evelina Bucy on: 01/05/2022 10:57 AM   Modules accepted: Orders

## 2022-01-05 NOTE — Progress Notes (Signed)
01/05/2022 10:44 AM   Leward Quan [place] 11-17-1945 [id number]  Referring provider: Maryland Pink, MD [address] [facility] [place],  [place] [address]  Chief Complaint  Patient presents with   Follow-up    1 year follow-up    HPI: Reviewed my note.   I reviewed my notes from [facility] August 2021.  She failed Botox in the office and was painful.  She had reached the end of the treatment algorithm.  She had mixed incontinence and leakage without awareness and high-volume bedwetting soaking 6 pads a day.  She had a lot of urethral pain after the Botox out of the ordinary.  She had a bulking agent treatment done March 16, 2019.  She was on trimethoprim.  She had dramatic improvement in her incontinence but then started to leak again.  Bulking agent treatment October 26, 2019.  Technically went very well.  She called back with painful bladder spasms.  She actually did very well for her mixed incontinence with a bulking agent but it had poor durability  She thinks she may have an infection now.  She is getting for 5 bladder infections on trimethoprim.  She gets burning and feels poorly     I will switch the patient to daily Keflex.  Minimal dysuria today so I will call if positive.  30x11 Keflex 250 mg sent.  Rash with penicillin discussed.  See in 3 months.  No further treatment for complicated mixed incontinence   Today Infection free on daily Keflex.  90x3 sent to pharmacy.  Incontinence stable.  No active treatment.  Frequency stable  Today Frequency stable.  Annual checkup. Patient says she is doing great on Keflex but then describes burning and perhaps a negative urine.  She was given high-dose Cipro for 10 days and she back to baseline.  She has some intermittent burning now but overall is doing very well.  If she did have a breakthrough this is the first 1.  No recent urine culture on chart     PMH: Past Medical History:  Diagnosis Date   Arthritis     osteoarthritis   Carpal tunnel syndrome of left wrist    Depression    Diabetes mellitus without complication ([facility])    Diverticulosis    GERD (gastroesophageal reflux disease)    Hypertension    PONV (postoperative nausea and vomiting)    PONV (postoperative nausea and vomiting)    Pulmonary fibrosis ([facility])    Recurrent UTI    SUI (stress urinary incontinence, female)    Wears dentures    full upper and lower    Surgical History: Past Surgical History:  Procedure Laterality Date    endocervical curettage     ABDOMINAL HYSTERECTOMY     BLADDER SUSPENSION     CARPAL TUNNEL RELEASE Left    CARPAL TUNNEL RELEASE Right 05/14/2015   Procedure: CARPAL TUNNEL RELEASE;  Surgeon: Hessie Knows, MD;  Location: [facility] OR[facility];  Service: Orthopedics;  Laterality: Right;   CATARACT EXTRACTION W/PHACO Right 12/17/2015   Procedure: CATARACT EXTRACTION PHACO AND INTRAOCULAR LENS PLACEMENT (IOC);  Surgeon: Birder Robson, MD;  Location: [facility];  Service: Ophthalmology;  Laterality: Right;  [place] 52.0 AP% 15.4 CDE 7.99 Fluid pack lot # [id number]   CATARACT EXTRACTION W/PHACO Left 01/21/2016   Procedure: CATARACT EXTRACTION PHACO AND INTRAOCULAR LENS PLACEMENT (IOC);  Surgeon: Birder Robson, MD;  Location: [facility];  Service: Ophthalmology;  Laterality: Left;  [place] 50.4 AP% 19.6 CDE 9.92 FLUID  PACK LOT # [id number]   CESAREAN SECTION N/A    COLONOSCOPY N/A 08/24/2014   Procedure: COLONOSCOPY;  Surgeon: Lollie Sails, MD;  Location: [facility] ENDOSCOPY;  Service: Endoscopy;  Laterality: N/A;   COLONOSCOPY N/A 03/13/2020   Procedure: COLONOSCOPY;  Surgeon: Toledo, Benay Pike, MD;  Location: [facility] ENDOSCOPY;  Service: Gastroenterology;  Laterality: N/A;   COLONOSCOPY WITH PROPOFOL N/A 04/05/2015   Procedure: COLONOSCOPY WITH PROPOFOL;  Surgeon: Lollie Sails, MD;  Location: [facility] END[facility]COPY;  Service: Endoscopy;  Laterality: N/A;   COLPORRHAPHY     CORONARY STENT INTERVENTION N/A 05/05/2021   Procedure: CORONARY  STENT INTERVENTION;  Surgeon: Wellington Hampshire, MD;  Location: [place] CV LAB;  Service: Cardiovascular;  Laterality: N/A;   EYE SURGERY     FINGER ARTHRODESIS Right 07/12/2014   Procedure: Right middle finger DIP fussion;  Surgeon: Hessie Knows, MD;  Location: [facility]S;  Service: Orthopedics;  Laterality: Right;   FRACTURE SURGERY     fractured wrist Right    HAMMER TOE SURGERY Left 04/01/2016   Procedure: HAMMER TOE CORRECTION  Left 2nd  & 3rd toe;  Surgeon: [name], DPM;  Location: [place];  Service: Podiatry;  Laterality: Left;  Special:  Hammer lock implants Left 2nd & 3rd IVA LOcal Diabetic - oral meds   LEFT HEART CATH AND CORONARY ANGIOGRAPHY Left 05/05/2021   Procedure: LEFT HEART CATH AND CORONARY ANGIOGRAPHY;  Surgeon: Wellington Hampshire, MD;  Location: [place] CV LAB;  Service: Cardiovascular;  Laterality: Left;   OPEN REDUCTION INTERNAL FIXATION (ORIF) METACARPAL Left 05/14/2015   Procedure: OPEN REDUCTION INTERNAL FIXATION (ORIF) METACARPAL;  Surgeon: Hessie Knows, MD;  Location: [facility];  Service: Orthopedics;  Laterality: Left;   SEPTOPLASTY     TUBAL LIGATION      Home Medications:  Allergies as of 01/05/2022       Reactions   Codeine Itching   Erythromycin Other (See Comments)   Stomach cramps   Nitrofurantoin Other (See Comments)   Scarring of lung per pt   Shrimp Extract Allergy Skin Test    Other reaction(s): Vomiting   Penicillins Rash   Has patient had a PCN reaction causing immediate rash, facial/tongue/throat swelling, SOB or lightheadedness with hypotension: Yes Has patient had a PCN reaction causing severe rash involving mucus membranes or skin necrosis: Unknown Has patient had a PCN reaction that required hospitalization No Has patient had a PCN reaction occurring within the last 10 years: No If all of the above answers are "NO", then may proceed with Cephalosporin use.        Medication List        Accurate as of  January 05, 2022 10:44 AM. If you have any questions, ask your nurse or doctor.          albuterol 108 (90 Base) MCG/ACT inhaler Commonly known as: VENTOLIN HFA 2 puffs Q4H PRN   amLODipine 10 MG tablet Commonly known as: NORVASC Take 10 mg by mouth at bedtime.   ascorbic acid 500 MG tablet Commonly known as: VITAMIN C Take 500 mg by mouth daily.   aspirin 81 MG chewable tablet Chew 1 tablet (81 mg total) by mouth daily.   budesonide 0.25 MG/2ML nebulizer solution Commonly known as: PULMICORT Take 2 mLs (0.25 mg total) by nebulization 2 (two) times daily.   calcium citrate-vitamin D 315-200 MG-UNIT tablet Commonly known as: CITRACAL+D Take 1 tablet by mouth 2 (two) times daily.   carvedilol 25 MG tablet  Commonly known as: COREG TAKE 1 TABLET TWICE DAILY (DOSE INCREASE)   cephALEXin 250 MG capsule Commonly known as: KEFLEX Take 250 mg by mouth daily. As needed for UTI   citalopram 20 MG tablet Commonly known as: CELEXA Take 20 mg by mouth daily.   clopidogrel 75 MG tablet Commonly known as: PLAVIX Take 1 tablet (75 mg total) by mouth daily with breakfast.   diphenhydrAMINE 25 MG tablet Commonly known as: BENADRYL Take 75 mg by mouth daily as needed for allergies.   diphenoxylate-atropine 2.5-0.025 MG tablet Commonly known as: LOMOTIL Take 1 tablet by mouth 2 (two) times daily as needed for diarrhea or loose stools.   fluticasone 50 MCG/ACT nasal spray Commonly known as: FLONASE Place 2 sprays into both nostrils 2 (two) times daily.   formoterol 20 MCG/2ML nebulizer solution Commonly known as: PERFOROMIST USE ONE VIAL VIA NEBULIZER AND INHALE BY MOUTH TWICE DAILY   furosemide 20 MG tablet Commonly known as: LASIX Take 20 mg by mouth daily.   glipiZIDE 5 MG 24 hr tablet Commonly known as: GLUCOTROL XL Take 5 mg by mouth daily with breakfast.   hydrALAZINE 50 MG tablet Commonly known as: APRESOLINE TAKE 1 TABLET BY MOUTH 3 TIMES DAILY   hydrOXYzine  10 MG tablet Commonly known as: ATARAX Take 10 mg by mouth every 8 (eight) hours as needed for itching.   losartan 100 MG tablet Commonly known as: COZAAR Take 100 mg by mouth at bedtime.   Melatonin 10 MG Caps Take 20 mg by mouth at bedtime.   ondansetron 4 MG disintegrating tablet Commonly known as: ZOFRAN-ODT Take 1 tablet (4 mg total) by mouth every 8 (eight) hours as needed for nausea or vomiting.   PREVACID PO Take by mouth.   PROBIOTIC BLEND PO Take 1 capsule by mouth daily.   rosuvastatin 20 MG tablet Commonly known as: CRESTOR Take 1 tablet (20 mg total) by mouth at bedtime.   traMADol 50 MG tablet Commonly known as: ULTRAM Take 50 mg by mouth 3 (three) times daily as needed for moderate pain.   traZODone 150 MG tablet Commonly known as: DESYREL Take 150 mg by mouth at bedtime.   VITAMIN B 12 PO Take 1 tablet by mouth daily.   Vitamin D3 25 MCG (1000 UT) Caps Take by mouth daily.   WOMENS MULTIVITAMIN PO Take 1 tablet by mouth daily.        Allergies:  Allergies  Allergen Reactions   Codeine Itching   Erythromycin Other (See Comments)    Stomach cramps   Nitrofurantoin Other (See Comments)    Scarring of lung per pt   Shrimp Extract Allergy Skin Test     Other reaction(s): Vomiting   Penicillins Rash    Has patient had a PCN reaction causing immediate rash, facial/tongue/throat swelling, SOB or lightheadedness with hypotension: Yes Has patient had a PCN reaction causing severe rash involving mucus membranes or skin necrosis: Unknown Has patient had a PCN reaction that required hospitalization No Has patient had a PCN reaction occurring within the last 10 years: No If all of the above answers are "NO", then may proceed with Cephalosporin use.     Family History: Family History  Problem Relation Age of Onset   Cancer Father    Breast cancer Maternal Aunt    Breast cancer Paternal Grandmother 14    Social History:  reports that she has  never smoked. She has never been exposed to tobacco smoke. She has never  used smokeless tobacco. She reports that she does not drink alcohol and does not use drugs.  ROS:                                        Physical Exam: There were no vitals taken for this visit.  Constitutional:  Alert and oriented, No acute distress. HEENT: [place] AT, moist mucus membranes.  Trachea midline, no masses.  Laboratory Data: Lab Results  Component Value Date   WBC 5.0 05/06/2021   HGB 11.9 (L) 05/06/2021   HCT 36.1 05/06/2021   MCV 89.1 05/06/2021   PLT 167 05/06/2021    Lab Results  Component Value Date   CREATININE 0.57 05/06/2021    No results found for: "PSA"  No results found for: "TESTOSTERONE"  No results found for: "HGBA1C"  Urinalysis    Component Value Date/Time   APPEARANCEUR Hazy (A) 10/29/2020 0827   GLUCOSEU Negative 10/29/2020 0827   BILIRUBINUR Negative 10/29/2020 0827   PROTEINUR Negative 10/29/2020 0827   NITRITE Negative 10/29/2020 0827   LEUKOCYTESUR Negative 10/29/2020 0827    Pertinent Imaging:   Assessment & Plan: Patient had breakthrough infections on trimethoprim.  There was pulmonary concerns with Macrodantin.  She may have had 1 breakthrough infection.  Try to get urine culture today and call if positive.  Renew Keflex 30 tablets and 11 refills and I will see in a year.  Role of probiotics and cranberry discussed  There are no diagnoses linked to this encounter.  No follow-ups on file.  Reece Packer, MD  [place] [address] [place], [place] [address] [phone]

## 2022-01-08 ENCOUNTER — Ambulatory Visit: Payer: Medicare HMO | Admitting: Pulmonary Disease

## 2022-01-08 ENCOUNTER — Encounter: Payer: Self-pay | Admitting: Pulmonary Disease

## 2022-01-08 VITALS — BP 128/78 | HR 60 | Temp 98.2°F | Ht 61.5 in | Wt 184.2 lb

## 2022-01-08 DIAGNOSIS — J45909 Unspecified asthma, uncomplicated: Secondary | ICD-10-CM

## 2022-01-08 DIAGNOSIS — R0602 Shortness of breath: Secondary | ICD-10-CM

## 2022-01-08 DIAGNOSIS — G259 Extrapyramidal and movement disorder, unspecified: Secondary | ICD-10-CM | POA: Diagnosis not present

## 2022-01-08 DIAGNOSIS — R0902 Hypoxemia: Secondary | ICD-10-CM

## 2022-01-08 DIAGNOSIS — J8410 Pulmonary fibrosis, unspecified: Secondary | ICD-10-CM | POA: Diagnosis not present

## 2022-01-08 DIAGNOSIS — J4540 Moderate persistent asthma, uncomplicated: Secondary | ICD-10-CM

## 2022-01-08 LAB — CULTURE, URINE COMPREHENSIVE

## 2022-01-08 NOTE — Progress Notes (Signed)
Subjective:    Patient ID: Tiffany Alexander, female    DOB: 04/20/1945, 76 y.o.   MRN: [id number] Patient Care Team: [place] Pink, MD as PCP - General (Family Medicine) Wellington Hampshire, MD as Consulting Physician (Cardiology)  Chief Complaint  Patient presents with   Follow-up    No SOB or cough.  Occasional wheezing with cold air.   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  She was last seen on June 2023. This is a scheduled visit.  She follows here for the issue of her asthmatic bronchitis.  Previously she was referred to cardiology and subsequently cardiac catheterization on 05 May 2021 revealing 90% RCA occlusion, the patient PCI to the RCA.  Since that intervention her shortness of breath has been markedly improved.  She is on Pulmicort and Perforomist (formoterol) via nebulizer as well as needed albuterol.  She notes that she hardly has to use her albuterol at all.  She does note some mild increase shortness of breath and "wheezing" when she is exposed to extreme cold.  But does not note this as a general rule.  She has not had any fevers, chills or sweats.  No increased cough or sputum production.  No hemoptysis.  She has nocturnal hypoxemia and is on supplemental oxygen at 2 L/min nocturnally.  She is compliant with the therapy and notes overall sense of wellbeing using the oxygen.    She does not endorse any complaint today.  Overall she feels well and looks well.   DATA 07/08/17 PFTs: FVC 2.61 L, 98% predicted.  FEV1 2.17 L, 106% predicted.  FEV1 ratio 83%.  TLC 77% predicted.  DLCO 71% predicted.  DLCO/VA 125% predicted 08/03/17 HRCT chest: Very mild nonspecific interstitial changes with basilar predominance.  Multiple tiny pulmonary nodules scattered throughout the lungs bilaterally measuring 5 mm or less in size, nonspecific but statistically likely benign.   01/04/18 PFTs: FVC: 2.56 > 2.63 L (97 > 99 %pred), FEV1: 2.08 > 2.13 L (102 > 105 %pred), FEV1/FVC: 81%, TLC: 4.29 L (87 %pred), DLCO 87 %pred 11/08/2018: Eastern allergen profile was negative, total IgE not done.  CBC with differential showed no eosinophilia. 08/24/2019 PFTs: FEV1 1.88 L or 93% predicted, FEV1/FVC 86%.  No bronchodilator response. Volumes normal with the exception of low ERV consistent with obesity.  Patient capacity normal. Overall stable study when compared to previous. 12/19/2020 PFTs: FEV1 1.89 L or 97% predicted, FVC 2.28 L or 87% predicted, FEV1/FVC 74%, no bronchodilator response.  Lung volumes normal, ERV low consistent with obesity.  Diffusion capacity normal.  Overall stable study. 12/27/2020 high resolution CT: Fibrotic changes that remain extremely mild at this time, multiple small stable 5 mm nodules.  Coronary calcifications 01/09/2021 echocardiogram: LVEF 60 to 65% grade 1 DD, normal right ventricular function, no pulmonary hypertension.  Aortic sclerosis without stenosis. 04/24/2021 cardiac CT: Discrepancy between significant stenosis in the RCA and FFR (Fractional Flow Reserve), cardiac cath recommended. 05/05/2021 left heart cath and PCI: Significant finding of 90% stenosis of the RCA, patient required drug-eluting stent to RCA.  Mild to moderate disease affecting left coronary artery system, will require medical therapy, LVEF 55 to 65%.   Review of Systems A 10 point review of systems was performed and it is as noted above otherwise negative.  Patient Active Problem List   Diagnosis Date Noted   Situational depression 11/11/2021   Effort angina  05/05/2021   Lymphedema 09/25/2020   Chronic venous insufficiency 09/25/2020   Recurrent urinary tract infection 11/25/2017   Coronary artery disease involving native coronary artery of native heart 08/24/2017   Hyperlipidemia, mixed 08/24/2017   SOBOE (shortness of breath on exertion) 08/24/2017   Positive ANA  (antinuclear antibody) 08/20/2017   Pulmonary fibrosis ([place]) 08/20/2017   Status post finger joint fusion 07/16/2014   Allergic rhinitis 05/01/2013   Diabetes ([place]) 05/01/2013   HTN (hypertension) 05/01/2013   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   Lower urinary tract infectious disease 04/05/2013   Prolapse of female pelvic organs 11/04/2012   Urge incontinence 06/27/2012   Chronic cystitis 01/05/2012   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/05/2012   Incomplete emptying of bladder 01/05/2012   Social History   Tobacco Use   Smoking status: Never    Passive exposure: Never   Smokeless tobacco: Never  Substance Use Topics   Alcohol use: No   Allergies  Allergen Reactions   Codeine Itching   Erythromycin Other (See Comments)    Stomach cramps   Nitrofurantoin Other (See Comments)    Scarring of lung per pt   Shrimp Extract Allergy Skin Test     Other reaction(s): Vomiting   Penicillins Rash    Has patient had a PCN reaction causing immediate rash, facial/tongue/throat swelling, SOB or lightheadedness with hypotension: Yes Has patient had a PCN reaction causing severe rash involving mucus membranes or skin necrosis: Unknown Has patient had a PCN reaction that required hospitalization No Has patient had a PCN reaction occurring within the last 10 years: No If all of the above answers are "NO", then may proceed with Cephalosporin use.    Current Meds  Medication Sig   albuterol (VENTOLIN HFA) 108 (90 Base) MCG/ACT inhaler 2 puffs Q4H PRN   amLODipine (NORVASC) 10 MG tablet Take 10 mg by mouth at bedtime.   ascorbic acid (VITAMIN C) 500 MG tablet Take 500 mg by mouth daily.   aspirin 81 MG chewable tablet Chew 1 tablet (81 mg total) by mouth daily.   budesonide (PULMICORT) 0.25 MG/2ML nebulizer solution Take 2 mLs (0.25 mg total) by nebulization 2 (two) times daily.   calcium citrate-vitamin D (CITRACAL+D) 315-200 MG-UNIT tablet Take 1 tablet by mouth 2 (two) times daily.    carvedilol (COREG) 25 MG tablet TAKE 1 TABLET TWICE DAILY (DOSE INCREASE)   cephALEXin (KEFLEX) 250 MG capsule Take 1 capsule (250 mg total) by mouth daily. As needed for UTI   Cholecalciferol (VITAMIN D3) 1000 units CAPS Take by mouth daily.   citalopram (CELEXA) 20 MG tablet Take 20 mg by mouth daily.   clopidogrel (PLAVIX) 75 MG tablet Take 1 tablet (75 mg total) by mouth daily with breakfast.   Cyanocobalamin (VITAMIN B 12 PO) Take 1 tablet by mouth daily.   diphenhydrAMINE (BENADRYL) 25 MG tablet Take 75 mg by mouth daily as needed for allergies.   diphenoxylate-atropine (LOMOTIL) 2.5-0.025 MG tablet Take 1 tablet by mouth 2 (two) times daily as needed for diarrhea or loose stools.   fluticasone (FLONASE) 50 MCG/ACT nasal spray Place 2 sprays into both nostrils 2 (two) times daily.   formoterol (PERFOROMIST) 20 MCG/2ML nebulizer solution USE ONE VIAL VIA NEBULIZER AND INHALE BY MOUTH TWICE DAILY   furosemide (LASIX) 20 MG tablet Take 20 mg by mouth daily.   glipiZIDE (GLUCOTROL XL) 5 MG 24 hr tablet Take 5 mg by mouth daily with breakfast.   hydrALAZINE (APRESOLINE) 50 MG tablet TAKE 1 TABLET  BY MOUTH 3 TIMES DAILY   hydrOXYzine (ATARAX/VISTARIL) 10 MG tablet Take 10 mg by mouth every 8 (eight) hours as needed for itching.   Lansoprazole (PREVACID PO) Take by mouth.   losartan (COZAAR) 100 MG tablet Take 100 mg by mouth at bedtime.   Melatonin 10 MG CAPS Take 20 mg by mouth at bedtime.   Multiple Vitamins-Minerals (WOMENS MULTIVITAMIN PO) Take 1 tablet by mouth daily.   ondansetron (ZOFRAN-ODT) 4 MG disintegrating tablet Take 1 tablet (4 mg total) by mouth every 8 (eight) hours as needed for nausea or vomiting.   Probiotic Product (PROBIOTIC BLEND PO) Take 1 capsule by mouth daily.   rosuvastatin (CRESTOR) 20 MG tablet Take 1 tablet (20 mg total) by mouth at bedtime.   traMADol (ULTRAM) 50 MG tablet Take 50 mg by mouth 3 (three) times daily as needed for moderate pain.   traZODone  (DESYREL) 150 MG tablet Take 150 mg by mouth at bedtime.   Immunization History  Administered Date(s) Administered   Influenza, High Dose Seasonal PF 12/25/2014, 12/21/2016, 11/25/2017, 12/17/2018   PFIZER Comirnaty(Gray Top)Covid-19 Tri-Sucrose Vaccine 03/27/2019, 04/17/2019   PFIZER(Purple Top)SARS-COV-2 Vaccination 03/27/2019, 04/17/2019   Pneumococcal Conjugate-13 12/25/2014   Pneumococcal Polysaccharide-23 08/17/2017   Tdap 11/22/2021       Objective:   Physical Exam There were no vitals taken for this visit.  GENERAL: Awake, alert, obese woman, presents in transport chair (due to gait unsteadiness), no respiratory distress.  Akathisia noted.  No conversational dyspnea. HEAD: Normocephalic, atraumatic.  EYES: Pupils equal, round, reactive to light.  No scleral icterus.  MOUTH: Poor dentition, oral mucosa moist. NECK: Supple. No thyromegaly. Trachea midline. No JVD.  No adenopathy.  PULMONARY: Excellent air entry bilaterally.  Lungs clear to auscultation bilaterally.  CARDIOVASCULAR: S1 and S2. Regular rate and rhythm.  No rubs, murmurs or gallops heard. GASTROINTESTINAL: Obese abdomen, soft, nondistended. MUSCULOSKELETAL: No joint deformity, no clubbing, no edema.  No calf tenderness. NEUROLOGIC: No overt focal deficit.  Involuntary athetoid type movements.  No tremor. Speech is fluent.  She has developed some issues with gait instability, gait was not tested today she is on a transport chair.   SKIN: Intact,warm,dry.  On limited exam no rashes. PSYCH: Mood and behavior normal    Assessment & Plan:     ICD-10-CM   1. Moderate persistent asthma without complication  W09.81    Attempted FeNO patient could not perform maneuver Continue Perforomist/Pulmicort Continue as needed albuterol Appears compensated    2. SOB (shortness of breath)  R06.02    Markedly improved after intervention to RCA in February Compensated    3. 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 Very mild pulmonary fibrosis (HCC)  J84.10    Due to prior nitrofurantoin use No evidence of progression, clinically    5. Movement disorder  G25.9    Gait instability being evaluated by primary physician Previously referred to neurology for akathisia     Overall the patient is doing well.  She appears well compensated.  She is compliant with her medications and nocturnal oxygen.  Her issues with dyspnea are markedly improved after RCA stent placement in February 2023.  She has developed some gait instability that is being currently evaluated.  Previously she had been referred to neurology however it appears that she has not had this consultation yet.  We did recommend RSV vaccine this  year.  Patient is otherwise up to date on all other vaccines.  We will see the patient in follow-up in 6 months time she is to contact us prior to that time should any new difficulties arise.  [name], MD Advanced Bronchoscopy PCCM [facility]    *This note was dictated using voice recognition software/Dragon.  Despite best efforts to proofread, errors can occur which can change the meaning. Any transcriptional errors that result from this process are unintentional and may not be fully corrected at the time of dictation.

## 2022-01-08 NOTE — Patient Instructions (Signed)
I recommend that you get the RSV shot this year.  Continue using your nebulizer medication and as needed emergency inhaler.  We will see you in follow-up in 6 months time call sooner should any new problems arise.

## 2022-01-13 ENCOUNTER — Other Ambulatory Visit: Payer: Self-pay | Admitting: Cardiovascular Disease

## 2022-01-14 ENCOUNTER — Telehealth: Payer: Self-pay | Admitting: Cardiovascular Disease

## 2022-01-14 MED ORDER — HYDRALAZINE HCL 50 MG PO TABS: 50.0000 mg | ORAL_TABLET | Freq: Three times a day (TID) | ORAL | 2 refills | Status: DC

## 2022-01-14 NOTE — Telephone Encounter (Signed)
*  STAT* If patient is at the pharmacy, call can be transferred to refill team.   1. Which medications need to be refilled? (please list name of each medication and dose if known) hydrALAZINE (APRESOLINE) 50 MG tablet   2. Which pharmacy/location (including street and city if local pharmacy) is medication to be sent to? [place], [place]   3. Do they need a 30 day or 90 day supply? [place]

## 2022-01-20 MED ORDER — ALBUTEROL SULFATE (2.5 MG/3ML) 0.083% IN NEBU
2.5000 mg | INHALATION_SOLUTION | Freq: Once | RESPIRATORY_TRACT | Status: DC
  Filled 2022-01-20: qty 3

## 2022-01-26 ENCOUNTER — Ambulatory Visit: Payer: Medicare HMO | Admitting: Urology

## 2022-02-03 DIAGNOSIS — F4321 Adjustment disorder with depressed mood: Secondary | ICD-10-CM | POA: Diagnosis not present

## 2022-04-15 ENCOUNTER — Telehealth: Payer: Self-pay | Admitting: Pulmonary Disease

## 2022-04-15 DIAGNOSIS — J8410 Pulmonary fibrosis, unspecified: Secondary | ICD-10-CM

## 2022-04-15 MED ORDER — BUDESONIDE 0.25 MG/2ML IN SUSP: 0.2500 mg | Freq: Two times a day (BID) | RESPIRATORY_TRACT | 6 refills | Status: DC

## 2022-04-15 NOTE — Telephone Encounter (Signed)
I spoke with the patient. She needs a refill on her Budesonide sent to [facility].  I have sent in the refill and the patient is aware.  Nothing further needed.

## 2022-04-16 DIAGNOSIS — E119 Type 2 diabetes mellitus without complications: Secondary | ICD-10-CM | POA: Diagnosis not present

## 2022-04-16 DIAGNOSIS — F4321 Adjustment disorder with depressed mood: Secondary | ICD-10-CM | POA: Diagnosis not present

## 2022-04-16 DIAGNOSIS — F411 Generalized anxiety disorder: Secondary | ICD-10-CM | POA: Diagnosis not present

## 2022-04-16 DIAGNOSIS — R262 Difficulty in walking, not elsewhere classified: Secondary | ICD-10-CM | POA: Diagnosis not present

## 2022-04-16 DIAGNOSIS — R630 Anorexia: Secondary | ICD-10-CM | POA: Diagnosis not present

## 2022-04-16 DIAGNOSIS — I10 Essential (primary) hypertension: Secondary | ICD-10-CM | POA: Diagnosis not present

## 2022-04-16 DIAGNOSIS — J449 Chronic obstructive pulmonary disease, unspecified: Secondary | ICD-10-CM | POA: Diagnosis not present

## 2022-04-16 DIAGNOSIS — I2510 Atherosclerotic heart disease of native coronary artery without angina pectoris: Secondary | ICD-10-CM | POA: Diagnosis not present

## 2022-05-08 ENCOUNTER — Encounter: Payer: Self-pay | Admitting: Pulmonary Disease

## 2022-05-28 DIAGNOSIS — F4321 Adjustment disorder with depressed mood: Secondary | ICD-10-CM | POA: Diagnosis not present

## 2022-05-28 DIAGNOSIS — J8410 Pulmonary fibrosis, unspecified: Secondary | ICD-10-CM | POA: Diagnosis not present

## 2022-05-28 DIAGNOSIS — R300 Dysuria: Secondary | ICD-10-CM | POA: Diagnosis not present

## 2022-05-28 DIAGNOSIS — E119 Type 2 diabetes mellitus without complications: Secondary | ICD-10-CM | POA: Diagnosis not present

## 2022-05-28 DIAGNOSIS — I10 Essential (primary) hypertension: Secondary | ICD-10-CM | POA: Diagnosis not present

## 2022-06-05 ENCOUNTER — Other Ambulatory Visit: Payer: Self-pay | Admitting: Medical

## 2022-06-29 ENCOUNTER — Ambulatory Visit: Payer: Medicare HMO | Admitting: Pulmonary Disease

## 2022-06-29 ENCOUNTER — Encounter: Payer: Self-pay | Admitting: Pulmonary Disease

## 2022-06-29 VITALS — BP 118/70 | HR 60 | Temp 98.2°F | Ht 61.5 in | Wt 168.0 lb

## 2022-06-29 DIAGNOSIS — J4540 Moderate persistent asthma, uncomplicated: Secondary | ICD-10-CM | POA: Diagnosis not present

## 2022-06-29 DIAGNOSIS — J8410 Pulmonary fibrosis, unspecified: Secondary | ICD-10-CM

## 2022-06-29 DIAGNOSIS — J45909 Unspecified asthma, uncomplicated: Secondary | ICD-10-CM | POA: Diagnosis not present

## 2022-06-29 DIAGNOSIS — I25118 Atherosclerotic heart disease of native coronary artery with other forms of angina pectoris: Secondary | ICD-10-CM | POA: Diagnosis not present

## 2022-06-29 DIAGNOSIS — R0902 Hypoxemia: Secondary | ICD-10-CM

## 2022-06-29 NOTE — Patient Instructions (Signed)
Your lungs sounded good today.  Continue using your nebulizers as you are doing.  We will see you in follow-up in 6 months time call sooner should any new problems arise.

## 2022-06-29 NOTE — Progress Notes (Signed)
Subjective:    Patient ID: Tiffany Alexander, female    DOB: 07-30-1945, 77 y.o.   MRN: [id number] Patient Care Team: Jerl Mina, MD as PCP - General (Family Medicine) Iran Ouch, MD as Consulting Physician (Cardiology) Salena Saner, MD as Consulting Physician (Pulmonary Disease)  Chief Complaint  Patient presents with   Follow-up    No SOB or cough. Occasional wheezing.    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  She was last seen on 08 Jan 2022. This is a scheduled visit.  She follows here for the issue of her asthma/asthmatic bronchitis.  Recall that she was referred to cardiology and subsequently had cardiac catheterization on 05 May 2021 revealing 90% RCA occlusion, the patient PCI to the RCA.  Since that intervention her shortness of breath has been markedly improved.  She continues to follow-up with cardiology she is on a yearly basis follow-up.  She is on Pulmicort and Perforomist (formoterol) via nebulizer as well as needed albuterol for control of her asthma/asthmatic bronchitis.  She has difficulties with MDIs and DPI's and therefore relies on the nebulizers.  She notes that she hardly has to use her albuterol at all.  She does note some mild increase shortness of breath and "wheezing" when she is exposed to extreme cold.  But does not note this as a general rule.  She has not had any fevers, chills or sweats.  No increased cough or sputum production.  No hemoptysis.  She has nocturnal hypoxemia and is on supplemental oxygen at 2 L/min nocturnally.  She is compliant with the therapy and notes overall sense of wellbeing using the oxygen.     She does not endorse any complaint today.  Overall she feels well and looks well.  DATA 07/08/17 PFTs: FVC 2.61 L, 98% predicted.  FEV1 2.17 L, 106% predicted.  FEV1 ratio 83%.  TLC 77% predicted.  DLCO 71%  predicted.  DLCO/VA 125% predicted 08/03/17 HRCT chest: Very mild nonspecific interstitial changes with basilar predominance.  Multiple tiny pulmonary nodules scattered throughout the lungs bilaterally measuring 5 mm or less in size, nonspecific but statistically likely benign.  01/04/18 PFTs: FVC: 2.56 > 2.63 L (97 > 99 %pred), FEV1: 2.08 > 2.13 L (102 > 105 %pred), FEV1/FVC: 81%, TLC: 4.29 L (87 %pred), DLCO 87 %pred 11/08/2018: Eastern allergen profile was negative, total IgE not done.  CBC with differential showed no eosinophilia. 08/24/2019 PFTs: FEV1 1.88 L or 93% predicted, FEV1/FVC 86%.  No bronchodilator response. Volumes normal with the exception of low ERV consistent with obesity.  Patient capacity normal. Overall stable study when compared to previous. 12/19/2020 PFTs: FEV1 1.89 L or 97% predicted, FVC 2.28 L or 87% predicted, FEV1/FVC 74%, no bronchodilator response.  Lung volumes normal, ERV low consistent with obesity.  Diffusion capacity normal.  Overall stable study. 12/27/2020 high resolution CT: Fibrotic changes that remain extremely mild at this time, multiple small stable 5 mm nodules.  Coronary calcifications 01/09/2021 echocardiogram: LVEF 60 to 65% grade 1 DD, normal right ventricular function, no pulmonary hypertension.  Aortic sclerosis without stenosis. 04/24/2021 cardiac CT: Discrepancy between significant stenosis in the RCA and FFR (Fractional Flow Reserve), cardiac cath recommended. 05/05/2021 left heart cath and PCI: Significant finding of 90% stenosis of the RCA, patient required drug-eluting stent to RCA.  Mild to moderate disease affecting left coronary artery system, will require medical therapy, LVEF  55 to 65%.  Review of Systems A 10 point review of systems was performed and it is as noted above otherwise negative.  Patient Active Problem List   Diagnosis Date Noted   Situational depression 11/11/2021   Effort angina 05/05/2021   Lymphedema 09/25/2020    Chronic venous insufficiency 09/25/2020   Recurrent urinary tract infection 11/25/2017   Coronary artery disease involving native coronary artery of native heart 08/24/2017   Hyperlipidemia, mixed 08/24/2017   SOBOE (shortness of breath on exertion) 08/24/2017   Positive ANA (antinuclear antibody) 08/20/2017   Pulmonary fibrosis 08/20/2017   Status post finger joint fusion 07/16/2014   Allergic rhinitis 05/01/2013   Diabetes 05/01/2013   HTN (hypertension) 05/01/2013   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   Lower urinary tract infectious disease 04/05/2013   Prolapse of female pelvic organs 11/04/2012   Urge incontinence 06/27/2012   Chronic cystitis 01/05/2012   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/05/2012   Incomplete emptying of bladder 01/05/2012   Social History   Tobacco Use   Smoking status: Never    Passive exposure: Never   Smokeless tobacco: Never  Substance Use Topics   Alcohol use: No   Allergies  Allergen Reactions   Codeine Itching   Erythromycin Other (See Comments)    Stomach cramps   Nitrofurantoin Other (See Comments)    Scarring of lung per pt   Shrimp Extract     Other reaction(s): Vomiting   Penicillins Rash    Has patient had a PCN reaction causing immediate rash, facial/tongue/throat swelling, SOB or lightheadedness with hypotension: Yes Has patient had a PCN reaction causing severe rash involving mucus membranes or skin necrosis: Unknown Has patient had a PCN reaction that required hospitalization No Has patient had a PCN reaction occurring within the last 10 years: No If all of the above answers are "NO", then may proceed with Cephalosporin use.    Current Meds  Medication Sig   albuterol (VENTOLIN HFA) 108 (90 Base) MCG/ACT inhaler 2 puffs Q4H PRN   amLODipine (NORVASC) 10 MG tablet Take 10 mg by mouth at bedtime.   ARIPiprazole (ABILIFY) 2 MG tablet Take 2 mg by mouth once.   ascorbic acid (VITAMIN C) 500 MG tablet Take 500 mg by mouth daily.    aspirin 81 MG chewable tablet Chew 1 tablet (81 mg total) by mouth daily.   budesonide (PULMICORT) 0.25 MG/2ML nebulizer solution Take 2 mLs (0.25 mg total) by nebulization 2 (two) times daily.   calcium citrate-vitamin D (CITRACAL+D) 315-200 MG-UNIT tablet Take 1 tablet by mouth 2 (two) times daily.   carvedilol (COREG) 25 MG tablet TAKE 1 TABLET TWICE DAILY (DOSE INCREASE)   cephALEXin (KEFLEX) 250 MG capsule Take 1 capsule (250 mg total) by mouth daily. As needed for UTI   Cholecalciferol (VITAMIN D3) 1000 units CAPS Take by mouth daily.   citalopram (CELEXA) 20 MG tablet Take 20 mg by mouth daily.   clopidogrel (PLAVIX) 75 MG tablet TAKE 1 TABLET BY MOUTH DAILY WITH BREAKFAST   Cyanocobalamin (VITAMIN B 12 PO) Take 1 tablet by mouth daily.   diphenhydrAMINE (BENADRYL) 25 MG tablet Take 75 mg by mouth daily as needed for allergies.   diphenoxylate-atropine (LOMOTIL) 2.5-0.025 MG tablet Take 1 tablet by mouth 2 (two) times daily as needed for diarrhea or loose stools.   fluticasone (FLONASE) 50 MCG/ACT nasal spray Place 2 sprays into both nostrils 2 (two) times daily.   formoterol (PERFOROMIST) 20 MCG/2ML nebulizer solution USE ONE VIAL VIA NEBULIZER  AND INHALE BY MOUTH TWICE DAILY   furosemide (LASIX) 20 MG tablet Take 20 mg by mouth daily.   glipiZIDE (GLUCOTROL XL) 5 MG 24 hr tablet Take 5 mg by mouth daily with breakfast.   hydrALAZINE (APRESOLINE) 50 MG tablet Take 1 tablet (50 mg total) by mouth 3 (three) times daily.   hydrOXYzine (ATARAX/VISTARIL) 10 MG tablet Take 10 mg by mouth every 8 (eight) hours as needed for itching.   Lansoprazole (PREVACID PO) Take by mouth.   losartan (COZAAR) 100 MG tablet Take 100 mg by mouth at bedtime.   Melatonin 10 MG CAPS Take 20 mg by mouth at bedtime.   Multiple Vitamins-Minerals (WOMENS MULTIVITAMIN PO) Take 1 tablet by mouth daily.   ondansetron (ZOFRAN-ODT) 4 MG disintegrating tablet Take 1 tablet (4 mg total) by mouth every 8 (eight) hours as  needed for nausea or vomiting.   Probiotic Product (PROBIOTIC BLEND PO) Take 1 capsule by mouth daily.   rosuvastatin (CRESTOR) 20 MG tablet Take 1 tablet (20 mg total) by mouth at bedtime.   traMADol (ULTRAM) 50 MG tablet Take 50 mg by mouth 3 (three) times daily as needed for moderate pain.   traZODone (DESYREL) 150 MG tablet Take 150 mg by mouth at bedtime.   Immunization History  Administered Date(s) Administered   Fluad Quad(high Dose 65+) 11/07/2021   Influenza, High Dose Seasonal PF 12/25/2014, 12/21/2016, 11/25/2017, 12/17/2018   PFIZER Comirnaty(Gray Top)Covid-19 Tri-Sucrose Vaccine 03/27/2019, 04/17/2019   PFIZER(Purple Top)SARS-COV-2 Vaccination 03/27/2019, 04/17/2019   Pneumococcal Conjugate-13 12/25/2014   Pneumococcal Polysaccharide-23 08/17/2017   Tdap 11/22/2021         Objective:   Physical Exam BP 118/70 (BP Location: Left Arm, Cuff Size: Normal)   Pulse 60   Temp 98.2 F (36.8 C)   Ht 5' 1.5" (1.562 m)   Wt 168 lb (76.2 kg)   SpO2 98%   BMI 31.23 kg/m   SpO2: 98 % O2 Device: None (Room air)  GENERAL: Awake, alert, obese woman, fully ambulatory today, no respiratory distress.  Akathisia noted.  No conversational dyspnea. HEAD: Normocephalic, atraumatic.  EYES: Pupils equal, round, reactive to light.  No scleral icterus.  MOUTH: Poor dentition, oral mucosa moist. NECK: Supple. No thyromegaly. Trachea midline. No JVD.  No adenopathy.  PULMONARY: Excellent air entry bilaterally.  Lungs clear to auscultation bilaterally.  CARDIOVASCULAR: S1 and S2. Regular rate and rhythm.  No rubs, murmurs or gallops heard. GASTROINTESTINAL: Benign. MUSCULOSKELETAL: No joint deformity, no clubbing, no edema.  No calf tenderness. NEUROLOGIC: No overt focal deficit.  Involuntary athetoid type movements.  No tremor. Speech is fluent.  Gait was noted to have minimal instability today.   SKIN: Intact,warm,dry.  On limited exam no rashes. PSYCH: Mood and behavior normal.      Assessment & Plan:     ICD-10-CM   1. Moderate persistent asthma without complication  J45.40    Continue Perforomist [place] nebulizers Continue as needed albuterol Patient well-controlled    2. 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 Coronary artery disease of native artery of native heart with stable angina pectoris  I25.118    Stenosis ostial RCA found cath February 2023 Had stent placed Follows with cardiology    4. Very mild pulmonary fibrosis (HCC)  J84.10    Suspected due to prior Macrodantin use Follow clinically     Will  see the patient in follow-up in 6 months time call sooner should any new problems arise.   Gailen Shelter, MD Advanced Bronchoscopy PCCM [facility]    *This note was dictated using voice recognition software/Dragon.  Despite best efforts to proofread, errors can occur which can change the meaning. Any transcriptional errors that result from this process are unintentional and may not be fully corrected at the time of dictation.

## 2022-07-28 ENCOUNTER — Other Ambulatory Visit: Payer: Self-pay | Admitting: Cardiovascular Disease

## 2022-08-04 ENCOUNTER — Other Ambulatory Visit: Payer: Self-pay | Admitting: Family Medicine

## 2022-08-04 DIAGNOSIS — Z1231 Encounter for screening mammogram for malignant neoplasm of breast: Secondary | ICD-10-CM

## 2022-09-03 DIAGNOSIS — R143 Flatulence: Secondary | ICD-10-CM | POA: Diagnosis not present

## 2022-09-03 DIAGNOSIS — F4321 Adjustment disorder with depressed mood: Secondary | ICD-10-CM | POA: Diagnosis not present

## 2022-09-03 DIAGNOSIS — R109 Unspecified abdominal pain: Secondary | ICD-10-CM | POA: Diagnosis not present

## 2022-09-03 DIAGNOSIS — I10 Essential (primary) hypertension: Secondary | ICD-10-CM | POA: Diagnosis not present

## 2022-09-03 DIAGNOSIS — E119 Type 2 diabetes mellitus without complications: Secondary | ICD-10-CM | POA: Diagnosis not present

## 2022-09-03 DIAGNOSIS — E782 Mixed hyperlipidemia: Secondary | ICD-10-CM | POA: Diagnosis not present

## 2022-09-03 DIAGNOSIS — Z1331 Encounter for screening for depression: Secondary | ICD-10-CM | POA: Diagnosis not present

## 2022-09-03 DIAGNOSIS — R300 Dysuria: Secondary | ICD-10-CM | POA: Diagnosis not present

## 2022-09-03 DIAGNOSIS — Z0000 Encounter for general adult medical examination without abnormal findings: Secondary | ICD-10-CM | POA: Diagnosis not present

## 2022-09-11 ENCOUNTER — Telehealth: Payer: Self-pay | Admitting: Pulmonary Disease

## 2022-09-11 DIAGNOSIS — J4540 Moderate persistent asthma, uncomplicated: Secondary | ICD-10-CM

## 2022-09-11 NOTE — Telephone Encounter (Signed)
Pt's husband states wife's nebulizer machine is broke. Needs a new rx faxed to [phone]

## 2022-09-11 NOTE — Telephone Encounter (Signed)
Spoke to patient's spouse, Stephen(DPR) He stated that patient's nebulizer machine is broken. He would like an order placed to [facility]. Order has been placed. Nothing further needed.

## 2022-09-15 ENCOUNTER — Ambulatory Visit
Admission: RE | Admit: 2022-09-15 | Discharge: 2022-09-15 | Disposition: A | Discharge status: Home / Self Care | Payer: Medicare HMO | Source: Ambulatory Visit | Attending: Family Medicine | Admitting: Family Medicine

## 2022-09-15 DIAGNOSIS — Z1231 Encounter for screening mammogram for malignant neoplasm of breast: Secondary | ICD-10-CM | POA: Insufficient documentation

## 2022-09-24 ENCOUNTER — Other Ambulatory Visit: Payer: Self-pay | Admitting: Cardiovascular Disease

## 2022-10-10 ENCOUNTER — Other Ambulatory Visit: Payer: Self-pay | Admitting: Pulmonary Disease

## 2022-10-10 DIAGNOSIS — J8410 Pulmonary fibrosis, unspecified: Secondary | ICD-10-CM

## 2022-10-18 ENCOUNTER — Other Ambulatory Visit: Payer: Self-pay | Admitting: Pulmonary Disease

## 2022-11-20 DIAGNOSIS — J309 Allergic rhinitis, unspecified: Secondary | ICD-10-CM | POA: Diagnosis not present

## 2022-11-20 DIAGNOSIS — I2510 Atherosclerotic heart disease of native coronary artery without angina pectoris: Secondary | ICD-10-CM | POA: Diagnosis not present

## 2022-11-20 DIAGNOSIS — I10 Essential (primary) hypertension: Secondary | ICD-10-CM | POA: Diagnosis not present

## 2022-11-20 DIAGNOSIS — I872 Venous insufficiency (chronic) (peripheral): Secondary | ICD-10-CM | POA: Diagnosis not present

## 2022-11-20 DIAGNOSIS — E119 Type 2 diabetes mellitus without complications: Secondary | ICD-10-CM | POA: Diagnosis not present

## 2022-11-20 DIAGNOSIS — I890 Lymphedema, not elsewhere classified: Secondary | ICD-10-CM | POA: Diagnosis not present

## 2022-11-20 DIAGNOSIS — N3946 Mixed incontinence: Secondary | ICD-10-CM | POA: Diagnosis not present

## 2022-11-20 DIAGNOSIS — J8410 Pulmonary fibrosis, unspecified: Secondary | ICD-10-CM | POA: Diagnosis not present

## 2022-11-20 DIAGNOSIS — M19049 Primary osteoarthritis, unspecified hand: Secondary | ICD-10-CM | POA: Diagnosis not present

## 2022-11-21 DIAGNOSIS — I10 Essential (primary) hypertension: Secondary | ICD-10-CM | POA: Diagnosis not present

## 2022-11-21 DIAGNOSIS — E119 Type 2 diabetes mellitus without complications: Secondary | ICD-10-CM | POA: Diagnosis not present

## 2022-11-21 DIAGNOSIS — M19049 Primary osteoarthritis, unspecified hand: Secondary | ICD-10-CM | POA: Diagnosis not present

## 2022-11-21 DIAGNOSIS — I2510 Atherosclerotic heart disease of native coronary artery without angina pectoris: Secondary | ICD-10-CM | POA: Diagnosis not present

## 2022-11-21 DIAGNOSIS — J309 Allergic rhinitis, unspecified: Secondary | ICD-10-CM | POA: Diagnosis not present

## 2022-11-21 DIAGNOSIS — N3946 Mixed incontinence: Secondary | ICD-10-CM | POA: Diagnosis not present

## 2022-11-21 DIAGNOSIS — J8410 Pulmonary fibrosis, unspecified: Secondary | ICD-10-CM | POA: Diagnosis not present

## 2022-11-21 DIAGNOSIS — I872 Venous insufficiency (chronic) (peripheral): Secondary | ICD-10-CM | POA: Diagnosis not present

## 2022-11-21 DIAGNOSIS — I890 Lymphedema, not elsewhere classified: Secondary | ICD-10-CM | POA: Diagnosis not present

## 2022-11-26 ENCOUNTER — Telehealth: Payer: Self-pay | Admitting: Cardiovascular Disease

## 2022-11-26 DIAGNOSIS — M19049 Primary osteoarthritis, unspecified hand: Secondary | ICD-10-CM | POA: Diagnosis not present

## 2022-11-26 DIAGNOSIS — I10 Essential (primary) hypertension: Secondary | ICD-10-CM | POA: Diagnosis not present

## 2022-11-26 DIAGNOSIS — N3946 Mixed incontinence: Secondary | ICD-10-CM | POA: Diagnosis not present

## 2022-11-26 DIAGNOSIS — J8410 Pulmonary fibrosis, unspecified: Secondary | ICD-10-CM | POA: Diagnosis not present

## 2022-11-26 DIAGNOSIS — I2510 Atherosclerotic heart disease of native coronary artery without angina pectoris: Secondary | ICD-10-CM | POA: Diagnosis not present

## 2022-11-26 DIAGNOSIS — I872 Venous insufficiency (chronic) (peripheral): Secondary | ICD-10-CM | POA: Diagnosis not present

## 2022-11-26 DIAGNOSIS — I890 Lymphedema, not elsewhere classified: Secondary | ICD-10-CM | POA: Diagnosis not present

## 2022-11-26 DIAGNOSIS — E119 Type 2 diabetes mellitus without complications: Secondary | ICD-10-CM | POA: Diagnosis not present

## 2022-11-26 DIAGNOSIS — J309 Allergic rhinitis, unspecified: Secondary | ICD-10-CM | POA: Diagnosis not present

## 2022-11-26 NOTE — Telephone Encounter (Signed)
Pt c/o BP issue: STAT if pt c/o blurred vision, one-sided weakness or slurred speech  1. What are your last 5 BP readings? 132/60 today, 120/42 yesterday  2. Are you having any other symptoms (ex. Dizziness, headache, blurred vision, passed out)? No  3. What is your BP issue? Tiffany Alexander (her at home nurse) called with concerns regarding the numbers fluctuating and is requesting a callback. Please advise

## 2022-11-26 NOTE — Telephone Encounter (Signed)
Returned call to [employer] from [facility]. She stated pt BP today was  120/40 and 132/59. She stated pt has been c/o of feeling dizzy.  She stated pt report symptoms aren't new.  [place] reported pt's husband contacted pcp today was instructed to decrease losartan to 50 mg daily. Nurse advised home health nurse to follow pcp instructions and continue to montior. Home health nurse verbalized understanding.  mg

## 2022-11-27 DIAGNOSIS — J8410 Pulmonary fibrosis, unspecified: Secondary | ICD-10-CM | POA: Diagnosis not present

## 2022-11-27 DIAGNOSIS — I890 Lymphedema, not elsewhere classified: Secondary | ICD-10-CM | POA: Diagnosis not present

## 2022-11-27 DIAGNOSIS — I872 Venous insufficiency (chronic) (peripheral): Secondary | ICD-10-CM | POA: Diagnosis not present

## 2022-11-27 DIAGNOSIS — I2510 Atherosclerotic heart disease of native coronary artery without angina pectoris: Secondary | ICD-10-CM | POA: Diagnosis not present

## 2022-11-27 DIAGNOSIS — E119 Type 2 diabetes mellitus without complications: Secondary | ICD-10-CM | POA: Diagnosis not present

## 2022-11-27 DIAGNOSIS — J309 Allergic rhinitis, unspecified: Secondary | ICD-10-CM | POA: Diagnosis not present

## 2022-11-27 DIAGNOSIS — M19049 Primary osteoarthritis, unspecified hand: Secondary | ICD-10-CM | POA: Diagnosis not present

## 2022-11-27 DIAGNOSIS — I10 Essential (primary) hypertension: Secondary | ICD-10-CM | POA: Diagnosis not present

## 2022-11-27 DIAGNOSIS — N3946 Mixed incontinence: Secondary | ICD-10-CM | POA: Diagnosis not present

## 2022-12-01 DIAGNOSIS — I872 Venous insufficiency (chronic) (peripheral): Secondary | ICD-10-CM | POA: Diagnosis not present

## 2022-12-01 DIAGNOSIS — E119 Type 2 diabetes mellitus without complications: Secondary | ICD-10-CM | POA: Diagnosis not present

## 2022-12-01 DIAGNOSIS — I2510 Atherosclerotic heart disease of native coronary artery without angina pectoris: Secondary | ICD-10-CM | POA: Diagnosis not present

## 2022-12-01 DIAGNOSIS — N3946 Mixed incontinence: Secondary | ICD-10-CM | POA: Diagnosis not present

## 2022-12-01 DIAGNOSIS — J309 Allergic rhinitis, unspecified: Secondary | ICD-10-CM | POA: Diagnosis not present

## 2022-12-01 DIAGNOSIS — I10 Essential (primary) hypertension: Secondary | ICD-10-CM | POA: Diagnosis not present

## 2022-12-01 DIAGNOSIS — I890 Lymphedema, not elsewhere classified: Secondary | ICD-10-CM | POA: Diagnosis not present

## 2022-12-01 DIAGNOSIS — J8410 Pulmonary fibrosis, unspecified: Secondary | ICD-10-CM | POA: Diagnosis not present

## 2022-12-01 DIAGNOSIS — M19049 Primary osteoarthritis, unspecified hand: Secondary | ICD-10-CM | POA: Diagnosis not present

## 2022-12-02 DIAGNOSIS — E1159 Type 2 diabetes mellitus with other circulatory complications: Secondary | ICD-10-CM | POA: Diagnosis not present

## 2022-12-02 DIAGNOSIS — E782 Mixed hyperlipidemia: Secondary | ICD-10-CM | POA: Diagnosis not present

## 2022-12-03 DIAGNOSIS — M19049 Primary osteoarthritis, unspecified hand: Secondary | ICD-10-CM | POA: Diagnosis not present

## 2022-12-03 DIAGNOSIS — J8410 Pulmonary fibrosis, unspecified: Secondary | ICD-10-CM | POA: Diagnosis not present

## 2022-12-03 DIAGNOSIS — N3946 Mixed incontinence: Secondary | ICD-10-CM | POA: Diagnosis not present

## 2022-12-03 DIAGNOSIS — I890 Lymphedema, not elsewhere classified: Secondary | ICD-10-CM | POA: Diagnosis not present

## 2022-12-03 DIAGNOSIS — I872 Venous insufficiency (chronic) (peripheral): Secondary | ICD-10-CM | POA: Diagnosis not present

## 2022-12-03 DIAGNOSIS — E119 Type 2 diabetes mellitus without complications: Secondary | ICD-10-CM | POA: Diagnosis not present

## 2022-12-03 DIAGNOSIS — J309 Allergic rhinitis, unspecified: Secondary | ICD-10-CM | POA: Diagnosis not present

## 2022-12-03 DIAGNOSIS — I10 Essential (primary) hypertension: Secondary | ICD-10-CM | POA: Diagnosis not present

## 2022-12-03 DIAGNOSIS — I2510 Atherosclerotic heart disease of native coronary artery without angina pectoris: Secondary | ICD-10-CM | POA: Diagnosis not present

## 2022-12-08 ENCOUNTER — Other Ambulatory Visit: Payer: Self-pay | Admitting: Cardiovascular Disease

## 2022-12-08 DIAGNOSIS — I2510 Atherosclerotic heart disease of native coronary artery without angina pectoris: Secondary | ICD-10-CM | POA: Diagnosis not present

## 2022-12-08 DIAGNOSIS — E119 Type 2 diabetes mellitus without complications: Secondary | ICD-10-CM | POA: Diagnosis not present

## 2022-12-08 DIAGNOSIS — I10 Essential (primary) hypertension: Secondary | ICD-10-CM | POA: Diagnosis not present

## 2022-12-08 DIAGNOSIS — I872 Venous insufficiency (chronic) (peripheral): Secondary | ICD-10-CM | POA: Diagnosis not present

## 2022-12-08 DIAGNOSIS — M19049 Primary osteoarthritis, unspecified hand: Secondary | ICD-10-CM | POA: Diagnosis not present

## 2022-12-08 DIAGNOSIS — I890 Lymphedema, not elsewhere classified: Secondary | ICD-10-CM | POA: Diagnosis not present

## 2022-12-08 DIAGNOSIS — N3946 Mixed incontinence: Secondary | ICD-10-CM | POA: Diagnosis not present

## 2022-12-08 DIAGNOSIS — J8410 Pulmonary fibrosis, unspecified: Secondary | ICD-10-CM | POA: Diagnosis not present

## 2022-12-08 DIAGNOSIS — J309 Allergic rhinitis, unspecified: Secondary | ICD-10-CM | POA: Diagnosis not present

## 2022-12-09 DIAGNOSIS — R001 Bradycardia, unspecified: Secondary | ICD-10-CM | POA: Diagnosis not present

## 2022-12-09 DIAGNOSIS — E782 Mixed hyperlipidemia: Secondary | ICD-10-CM | POA: Diagnosis not present

## 2022-12-09 DIAGNOSIS — E119 Type 2 diabetes mellitus without complications: Secondary | ICD-10-CM | POA: Diagnosis not present

## 2022-12-09 DIAGNOSIS — I10 Essential (primary) hypertension: Secondary | ICD-10-CM | POA: Diagnosis not present

## 2022-12-09 DIAGNOSIS — M25561 Pain in right knee: Secondary | ICD-10-CM | POA: Diagnosis not present

## 2022-12-09 DIAGNOSIS — I2510 Atherosclerotic heart disease of native coronary artery without angina pectoris: Secondary | ICD-10-CM | POA: Diagnosis not present

## 2022-12-09 DIAGNOSIS — F4321 Adjustment disorder with depressed mood: Secondary | ICD-10-CM | POA: Diagnosis not present

## 2022-12-09 DIAGNOSIS — R4189 Other symptoms and signs involving cognitive functions and awareness: Secondary | ICD-10-CM | POA: Diagnosis not present

## 2022-12-29 ENCOUNTER — Telehealth: Payer: Self-pay | Admitting: Urology

## 2022-12-29 ENCOUNTER — Ambulatory Visit: Payer: Medicare HMO | Admitting: Pulmonary Disease

## 2022-12-29 ENCOUNTER — Encounter: Payer: Self-pay | Admitting: Pulmonary Disease

## 2022-12-29 VITALS — BP 120/70 | HR 75 | Temp 97.8°F | Ht 61.5 in | Wt 157.4 lb

## 2022-12-29 DIAGNOSIS — I25118 Atherosclerotic heart disease of native coronary artery with other forms of angina pectoris: Secondary | ICD-10-CM

## 2022-12-29 DIAGNOSIS — R0902 Hypoxemia: Secondary | ICD-10-CM

## 2022-12-29 DIAGNOSIS — J8410 Pulmonary fibrosis, unspecified: Secondary | ICD-10-CM | POA: Diagnosis not present

## 2022-12-29 DIAGNOSIS — G259 Extrapyramidal and movement disorder, unspecified: Secondary | ICD-10-CM | POA: Diagnosis not present

## 2022-12-29 DIAGNOSIS — R062 Wheezing: Secondary | ICD-10-CM

## 2022-12-29 DIAGNOSIS — J45909 Unspecified asthma, uncomplicated: Secondary | ICD-10-CM | POA: Diagnosis not present

## 2022-12-29 DIAGNOSIS — J4540 Moderate persistent asthma, uncomplicated: Secondary | ICD-10-CM | POA: Diagnosis not present

## 2022-12-29 LAB — NITRIC OXIDE: Nitric Oxide: 36

## 2022-12-29 MED ORDER — CEPHALEXIN 250 MG PO CAPS: 250.0000 mg | ORAL_CAPSULE | Freq: Every day | ORAL | 11 refills | Status: DC | PRN

## 2022-12-29 NOTE — Patient Instructions (Signed)
VISIT SUMMARY:  During your visit, we discussed your chronic asthmatic bronchitis/asthma, mild pulmonary fibrosis, weight loss, heart condition, and hypertension. We also talked about your general health maintenance. You've been managing your conditions well, and your recent weight loss has positively impacted your breathing.  YOUR PLAN:  -CHRONIC ASTHMATIC BRONCHITIS/ASTHMA: This is a long-term inflammation of your airways which can cause wheezing and difficulty breathing. Continue using your Pulmicort and Performist via a nebulizer. We will keep prednisone on hand for potential future exacerbations.  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 We will order a chest x-ray to assess the current status of your lungs. You can complete this at a time that is convenient for you.  -WEIGHT LOSS: You've lost a significant amount of weight, which has improved your breathing. Keep up the good work with your weight management.  -CARDIAC CONDITION: You have a history of heart disease and have had a stent placed in the past. Continue with your current management and follow up with your cardiologist, Dr. Mariah Milling, next week.  -HYPERTENSION: This is a condition where the force of the blood against your artery walls is too high. Continue with your current medication regimen as adjusted by your primary care provider, Dr. Robby Sermon.  INSTRUCTIONS:  Plan to receive your flu shot at the pharmacy in November. We will follow up in 3-4 months to check on your progress and make any necessary adjustments to your treatment plan.

## 2022-12-29 NOTE — Telephone Encounter (Signed)
Pt's husband stopped by office asking about refill for Keflex for pt.  He said [facility] faxed over a request to [place] yesterday.  Pt is almost out.  She has appt 10/28.

## 2022-12-29 NOTE — Telephone Encounter (Signed)
rx sent to pharmacy by e-script Spoke with patient and advised results   

## 2022-12-29 NOTE — Progress Notes (Signed)
Subjective:    Patient ID: Tiffany Alexander, female    DOB: 1945-10-25, 77 y.o.   MRN: [id number]  Patient Care Team: Jerl Mina, MD as PCP - General (Family Medicine) Iran Ouch, MD as Consulting Physician (Cardiology) Salena Saner, MD as Consulting Physician (Pulmonary Disease)  Chief Complaint  Patient presents with   Follow-up    Covid in August. Occasional wheezing. No cough or SOB.   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  She was last seen on 29 June 2022. This is a scheduled visit.  She follows here for the issue of her asthma/asthmatic bronchitis.  In the interim she was noted to have COVID-19 in August but did not require hospitalization, did have some increased wheezing over her baseline now back to baseline.  HPI Discussed the use of AI scribe software for clinical note transcription with the patient, who gave verbal consent to proceed.  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 She had a recent bout of COVID-19 in August, which was associated with significant wheezing. Recently, she has noticed occasional wheezing when lying on her right side. However, she reports overall good control of her respiratory symptoms with her nebulizers.  The patient has also experienced significant weight loss, dropping from 200 lbs to her current weight. This weight loss has had a positive impact on her breathing. She has a history of heart disease, managed by Dr. Mariah Milling, and has had a stent placed in the past. She is due for a follow-up visit with her cardiologist next week.  The patient's medication regimen has been adjusted recently due to concerns  about low blood pressure. She was previously on multiple medications, including rosuvastatin , which was reduced from 40 to 20. Another blood pressure medication was discontinued due to persistent low blood pressure readings.  The patient's asthma management includes Pulmicort and Performist via a nebulizer. She is on nocturnal oxygen supplementation, she is compliant with this and notes benefit of therapy.    She has not received her flu shot yet due to recent COVID-19 infection but plans to get it in November.      DATA 07/08/17 PFTs: FVC 2.61 L, 98% predicted.  FEV1 2.17 L, 106% predicted.  FEV1 ratio 83%.  TLC 77% predicted.  DLCO 71% predicted.  DLCO/VA 125% predicted 08/03/17 HRCT chest: Very mild nonspecific interstitial changes with basilar predominance.  Multiple tiny pulmonary nodules scattered throughout the lungs bilaterally measuring 5 mm or less in size, nonspecific but statistically likely benign.  01/04/18 PFTs: FVC: 2.56 > 2.63 L (97 > 99 %pred), FEV1: 2.08 > 2.13 L (102 > 105 %pred), FEV1/FVC: 81%, TLC: 4.29 L (87 %pred), DLCO 87 %pred 11/08/2018: Eastern allergen profile was negative, total IgE not done.  CBC with differential showed no eosinophilia. 08/24/2019 PFTs: FEV1 1.88 L or 93% predicted, FEV1/FVC 86%.  No bronchodilator response. Volumes normal with the exception of low ERV consistent with obesity.  Patient capacity normal. Overall stable study when compared to previous. 12/19/2020 PFTs: FEV1 1.89 L or 97% predicted, FVC 2.28 L or 87% predicted, FEV1/FVC 74%, no bronchodilator response.  Lung volumes normal, ERV low consistent with obesity.  Diffusion capacity normal.  Overall stable study. 12/27/2020 high resolution CT: Fibrotic  changes that remain extremely mild at this time, multiple small stable 5 mm nodules.  Coronary calcifications 01/09/2021 echocardiogram: LVEF 60 to 65% grade 1 DD, normal right ventricular function, no pulmonary hypertension.  Aortic sclerosis  without stenosis. 04/24/2021 cardiac CT: Discrepancy between significant stenosis in the RCA and FFR (Fractional Flow Reserve), cardiac cath recommended. 05/05/2021 left heart cath and PCI: Significant finding of 90% stenosis of the RCA, patient required drug-eluting stent to RCA.  Mild to moderate disease affecting left coronary artery system, will require medical therapy, LVEF 55 to 65%.  Review of Systems A 10 point review of systems was performed and it is as noted above otherwise negative.   Patient Active Problem List   Diagnosis Date Noted   Situational depression 11/11/2021   Effort angina (HCC) 05/05/2021   Lymphedema 09/25/2020   Chronic venous insufficiency 09/25/2020   Recurrent urinary tract infection 11/25/2017   Coronary artery disease involving native coronary artery of native heart 08/24/2017   Hyperlipidemia, mixed 08/24/2017   SOBOE (shortness of breath on exertion) 08/24/2017   Positive ANA (antinuclear antibody) 08/20/2017   Pulmonary fibrosis (HCC) 08/20/2017   Status post finger joint fusion 07/16/2014   Allergic rhinitis 05/01/2013   Diabetes (HCC) 05/01/2013   HTN (hypertension) 05/01/2013   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   Lower urinary tract infectious disease 04/05/2013   Prolapse of female pelvic organs 11/04/2012   Urge incontinence 06/27/2012   Chronic cystitis 01/05/2012   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/05/2012   Incomplete emptying of bladder 01/05/2012    Social History   Tobacco Use   Smoking status: Never    Passive exposure: Never   Smokeless tobacco: Never  Substance Use Topics   Alcohol use: No    Allergies  Allergen Reactions   Codeine Itching   Erythromycin Other (See Comments)    Stomach cramps   Nitrofurantoin Other (See Comments)    Scarring of lung per pt   Shrimp Extract     Other reaction(s): Vomiting   Penicillins Rash    Has patient had a PCN reaction causing immediate rash, facial/tongue/throat swelling, SOB  or lightheadedness with hypotension: Yes Has patient had a PCN reaction causing severe rash involving mucus membranes or skin necrosis: Unknown Has patient had a PCN reaction that required hospitalization No Has patient had a PCN reaction occurring within the last 10 years: No If all of the above answers are "NO", then may proceed with Cephalosporin use.     Current Meds  Medication Sig   albuterol (VENTOLIN HFA) 108 (90 Base) MCG/ACT inhaler 2 puffs Q4H PRN   amLODipine (NORVASC) 10 MG tablet Take 10 mg by mouth at bedtime.   ARIPiprazole (ABILIFY) 2 MG tablet Take 2 mg by mouth once.   ascorbic acid (VITAMIN C) 500 MG tablet Take 500 mg by mouth daily.   aspirin 81 MG chewable tablet Chew 1 tablet (81 mg total) by mouth daily.   budesonide (PULMICORT) 0.25 MG/2ML nebulizer solution INHALE THE CONTENTS OF 1 VIAL VIA NEBULIZER TWICE DAILY   calcium citrate-vitamin D (CITRACAL+D) 315-200 MG-UNIT tablet Take 1 tablet by mouth 2 (two) times daily.   carvedilol (COREG) 25 MG tablet TAKE 1 TABLET TWICE DAILY (DOSE INCREASE)   cephALEXin (KEFLEX) 250 MG capsule Take 1 capsule (250 mg total) by mouth daily. As needed for UTI   Cholecalciferol (VITAMIN D3) 1000 units CAPS Take by mouth daily.   citalopram (CELEXA) 20 MG tablet Take 20 mg by mouth daily.   clopidogrel (  PLAVIX) 75 MG tablet TAKE 1 TABLET BY MOUTH DAILY WITH BREAKFAST   Cyanocobalamin (VITAMIN B 12 PO) Take 1 tablet by mouth daily.   diphenhydrAMINE (BENADRYL) 25 MG tablet Take 75 mg by mouth daily as needed for allergies.   diphenoxylate-atropine (LOMOTIL) 2.5-0.025 MG tablet Take 1 tablet by mouth 2 (two) times daily as needed for diarrhea or loose stools.   fluticasone (FLONASE) 50 MCG/ACT nasal spray Place 2 sprays into both nostrils 2 (two) times daily.   formoterol (PERFOROMIST) 20 MCG/2ML nebulizer solution USE ONE VIAL VIA NEBULIZER AND INHALE BY MOUTH TWICE DAILY   furosemide (LASIX) 20 MG tablet Take 20 mg by mouth daily.    glipiZIDE (GLUCOTROL XL) 5 MG 24 hr tablet Take 5 mg by mouth daily with breakfast.   hydrALAZINE (APRESOLINE) 50 MG tablet TAKE 1 TABLET BY MOUTH THREE TIMES DAILY   hydrOXYzine (ATARAX/VISTARIL) 10 MG tablet Take 10 mg by mouth every 8 (eight) hours as needed for itching.   Lansoprazole (PREVACID PO) Take by mouth.   losartan (COZAAR) 25 MG tablet Take 1 tablet by mouth daily.   Melatonin 10 MG CAPS Take 20 mg by mouth at bedtime.   Multiple Vitamins-Minerals (WOMENS MULTIVITAMIN PO) Take 1 tablet by mouth daily.   ondansetron (ZOFRAN-ODT) 4 MG disintegrating tablet Take 1 tablet (4 mg total) by mouth every 8 (eight) hours as needed for nausea or vomiting.   pantoprazole (PROTONIX) 40 MG tablet Take 40 mg by mouth daily.   Probiotic Product (PROBIOTIC BLEND PO) Take 1 capsule by mouth daily.   rosuvastatin (CRESTOR) 20 MG tablet Take 1 tablet (20 mg total) by mouth at bedtime.   traMADol (ULTRAM) 50 MG tablet Take 50 mg by mouth 3 (three) times daily as needed for moderate pain.   traZODone (DESYREL) 150 MG tablet Take 150 mg by mouth at bedtime.   venlafaxine XR (EFFEXOR-XR) 37.5 MG 24 hr capsule Take 37.5 mg by mouth daily.    Immunization History  Administered Date(s) Administered   Fluad Quad(high Dose 65+) 11/07/2021   Influenza, High Dose Seasonal PF 12/25/2014, 12/21/2016, 11/25/2017, 12/17/2018   PFIZER Comirnaty(Gray Top)Covid-19 Tri-Sucrose Vaccine 03/27/2019, 04/17/2019   PFIZER(Purple Top)SARS-COV-2 Vaccination 03/27/2019, 04/17/2019   Pneumococcal Conjugate-13 12/25/2014   Pneumococcal Polysaccharide-23 08/17/2017   Tdap 11/22/2021        Objective:     BP 120/70 (BP Location: Right Arm, Cuff Size: Normal)   Pulse 75   Temp 97.8 F (36.6 C)   Ht 5' 1.5" (1.562 m)   Wt 157 lb 6.4 oz (71.4 kg)   SpO2 97%   BMI 29.26 kg/m   SpO2: 97 % O2 Device: None (Room air)  GENERAL: Awake, alert, obese woman, fully ambulatory today, no respiratory distress.  Akathisia  noted.  No conversational dyspnea. HEAD: Normocephalic, atraumatic.  EYES: Pupils equal, round, reactive to light.  No scleral icterus.  MOUTH: Poor dentition, oral mucosa moist. NECK: Supple. No thyromegaly. Trachea midline. No JVD.  No adenopathy.  PULMONARY: Excellent air entry bilaterally.  Lungs clear to auscultation bilaterally.  CARDIOVASCULAR: S1 and S2. Regular rate and rhythm.  No rubs, murmurs or gallops heard. GASTROINTESTINAL: Benign. MUSCULOSKELETAL: No joint deformity, no clubbing, no edema.  No calf tenderness. NEUROLOGIC: No overt focal deficit.  Involuntary athetoid type movements.  No tremor. Speech is fluent.  Gait was noted to have minimal instability today.   SKIN: Intact,warm,dry.  On limited exam no rashes. PSYCH: Mood and behavior normal.  Lab Results  Component  Value Date   NITRICOXIDE 36 12/29/2022    Assessment & Plan:     ICD-10-CM   1. Moderate persistent asthma without complication  J45.40 Nitric oxide    2. Wheezing  R06.2 DG Chest 2 View    3. 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 Coronary artery disease of native artery of native heart with stable angina pectoris (HCC)  I25.118     5. Very mild pulmonary fibrosis (HCC)  J84.10     6. Movement disorder  G25.9      Orders Placed This Encounter  Procedures   DG Chest 2 View    Standing Status:   Future    Standing Expiration Date:   12/29/2023    Order Specific Question:   Reason for Exam (SYMPTOM  OR DIAGNOSIS REQUIRED)    Answer:   wheezing    Order Specific Question:   Preferred imaging location?    Answer:   [facility]   Nitric oxide   Assessment and Plan    Chronic Asthmatic Bronchitis/Asthma Reports of wheezing when lying on right side. No recent exacerbations. Nebulizer use is consistent and effective. Nitric oxide is 36 ppb. -Continue Pulmicort and Performist via nebulizer. -Reserve prednisone for potential future exacerbations.  Nocturnal  hypoxemia due to asthma/asthmatic bronchitis -Patient compliant with oxygen therapy at 2 L/min nocturnally. -Patient notes benefit of therapy.  Mild Pulmonary Fibrosis due to prior Nitrofurantoin toxicity No new symptoms reported. -Order chest x-ray to assess current status. Patient to complete at a convenient time.  Weight Loss Significant weight loss noted, which has improved breathing. -Encourage continued weight management.  Coronary artery disease with prior PCI to RCA No new symptoms reported. Patient to follow up with cardiologist, Dr. Mariah Milling, next week. -No changes to current management.  Hypertension Medication adjustments made due to low blood pressure readings, likely secondary to weight loss. -Continue current regimen as adjusted by patient's primary care provider, Dr. Robby Sermon.  General Health Maintenance -Plan to receive flu shot at pharmacy in November. -Follow up in 3-4 months.      Gailen Shelter, MD Advanced Bronchoscopy PCCM [facility]    *This note was dictated using voice recognition software/Dragon.  Despite best efforts to proofread, errors can occur which can change the meaning. Any transcriptional errors that result from this process are unintentional and may not be fully corrected at the time of dictation.

## 2023-01-04 ENCOUNTER — Encounter: Payer: Self-pay | Admitting: Urology

## 2023-01-04 ENCOUNTER — Ambulatory Visit: Payer: Medicare HMO | Admitting: Urology

## 2023-01-04 VITALS — BP 114/68 | HR 77 | Ht 61.5 in | Wt 160.0 lb

## 2023-01-04 DIAGNOSIS — N3020 Other chronic cystitis without hematuria: Secondary | ICD-10-CM

## 2023-01-04 LAB — URINALYSIS, COMPLETE
Bilirubin, UA: NEGATIVE
Glucose, UA: NEGATIVE
Ketones, UA: NEGATIVE
Leukocytes,UA: NEGATIVE
Nitrite, UA: NEGATIVE
Protein,UA: NEGATIVE
RBC, UA: NEGATIVE
Specific Gravity, UA: 1.02 (ref 1.005–1.030)
Urobilinogen, Ur: 1 mg/dL (ref 0.2–1.0)
pH, UA: 6 (ref 5.0–7.5)

## 2023-01-04 LAB — MICROSCOPIC EXAMINATION: Bacteria, UA: NONE SEEN

## 2023-01-04 NOTE — Progress Notes (Signed)
01/04/2023 10:46 AM   Virgina Evener Radigan 30-Jan-1946 [id number]  Referring provider: Jerl Mina, MD [address] [facility] [place],  [place] [address]  No chief complaint on file.   HPI: Reviewed my note.   I reviewed my notes from [facility] August 2021.  She failed Botox in the office and was painful.  She had reached the end of the treatment algorithm.  She had mixed incontinence and leakage without awareness and high-volume bedwetting soaking 6 pads a day.  She had a lot of urethral pain after the Botox out of the ordinary.  She had a bulking agent treatment done March 16, 2019.  She was on trimethoprim.  She had dramatic improvement in her incontinence but then started to leak again.  Bulking agent treatment October 26, 2019.  Technically went very well.  She called back with painful bladder spasms.  She actually did very well for her mixed incontinence with a bulking agent but it had poor durability  She thinks she may have an infection now.  She is getting for 5 bladder infections on trimethoprim.  She gets burning and feels poorly     I will switch the patient to daily Keflex.  Minimal dysuria today so I will call if positive.  30x11 Keflex 250 mg sent.  Rash with penicillin discussed.  See in 3 months.  No further treatment for complicated mixed incontinence   Infection free on daily Keflex.  90x3 sent to pharmacy.  Incontinence stable.  No active treatment.  Frequency stable   Patient says she is doing great on Keflex but then describes burning and perhaps a negative urine.  She was given high-dose Cipro for 10 days and she back to baseline.  She has some intermittent burning now but overall is doing very well.  If she did have a breakthrough this is the first 1.  No recent urine culture on chart    Patient had breakthrough infections on trimethoprim. There was pulmonary concerns with Macrodantin. She may have had 1 breakthrough infection. Try to get urine culture today  and call if positive. Renew Keflex 30 tablets and 11 refills and I will see in a year. Role of probiotics and cranberry discussed   Today Frequency stable.  Culture 1 year ago positive  Today Urgency and frequency stable.  Clinically not infected.  No breakthrough infections   PMH: Past Medical History:  Diagnosis Date   Arthritis    osteoarthritis   Carpal tunnel syndrome of left wrist    Depression    Diabetes mellitus without complication (HCC)    Diverticulosis    GERD (gastroesophageal reflux disease)    Hypertension    PONV (postoperative nausea and vomiting)    PONV (postoperative nausea and vomiting)    Pulmonary fibrosis (HCC)    Recurrent UTI    SUI (stress urinary incontinence, female)    Wears dentures    full upper and lower    Surgical History: Past Surgical History:  Procedure Laterality Date    endocervical curettage     ABDOMINAL HYSTERECTOMY     BLADDER SUSPENSION     CARPAL TUNNEL RELEASE Left    CARPAL TUNNEL RELEASE Right 05/14/2015   Procedure: CARPAL TUNNEL RELEASE;  Surgeon: Kennedy Bucker, MD;  Location: [facility] ORS;  Service: Orthopedics;  Laterality: Right;   CATARACT EXTRACTION W/PHACO Right 12/17/2015   Procedure: CATARACT EXTRACTION PHACO AND INTRAOCULAR LENS PLACEMENT (IOC);  Surgeon: Galen Manila, MD;  Location: [facility] ORS;  Service: Ophthalmology;  Laterality: Right;  [place] 52.0 AP% 15.4 CDE 7.99 Fluid pack lot # [id number]   CATARACT EXTRACTION W/PHACO Left 01/21/2016   Procedure: CATARACT EXTRACTION PHACO AND INTRAOCULAR LENS PLACEMENT (IOC);  Surgeon: Galen Manila, MD;  Location: [facility] ORS;  Service: Ophthalmology;  Laterality: Left;  [place] 50.4 AP% 19.6 CDE 9.92 FLUID PACK LOT # [id number]   CESAREAN SECTION N/A    COLONOSCOPY N/A 08/24/2014   Procedure: COLONOSCOPY;  Surgeon: Christena Deem, MD;  Location: [facility] ENDOSCOPY;  Service: Endoscopy;  Laterality: N/A;   COLONOSCOPY N/A 03/13/2020   Procedure: COLONOSCOPY;  Surgeon: Toledo, Boykin Nearing, MD;  Location: [facility] ENDOSCOPY;  Service: Gastroenterology;  Laterality: N/A;   COLONOSCOPY WITH PROPOFOL N/A 04/05/2015   Procedure: COLONOSCOPY WITH PROPOFOL;  Surgeon: Christena Deem, MD;  Location: [facility] ENDOSCOPY;  Service: Endoscopy;  Laterality: N/A;   COLPORRHAPHY     CORONARY STENT INTERVENTION N/A 05/05/2021   Procedure: CORONARY STENT INTERVENTION;  Surgeon: Iran Ouch, MD;  Location: [facility] INVASIVE CV LAB;  Service: Cardiovascular;  Laterality: N/A;   EYE SURGERY     FINGER ARTHRODESIS Right 07/12/2014   Procedure: Right middle finger DIP fussion;  Surgeon: Kennedy Bucker, MD;  Location: [facility] ORS;  Service: Orthopedics;  Laterality: Right;   FRACTURE SURGERY     fractured wrist Right    HAMMER TOE SURGERY Left 04/01/2016   Procedure: HAMMER TOE CORRECTION  Left 2nd  & 3rd toe;  Surgeon: Gwyneth Revels, DPM;  Location: [facility];  Service: Podiatry;  Laterality: Left;  Special:  Hammer lock implants Left 2nd & 3rd IVA LOcal Diabetic - oral meds   LEFT HEART CATH AND CORONARY ANGIOGRAPHY Left 05/05/2021   Procedure: LEFT HEART CATH AND CORONARY ANGIOGRAPHY;  Surgeon: Iran Ouch, MD;  Location: [facility] INVASIVE CV LAB;  Service: Cardiovascular;  Laterality: Left;   OPEN REDUCTION INTERNAL FIXATION (ORIF) METACARPAL Left 05/14/2015   Procedure: OPEN REDUCTION INTERNAL FIXATION (ORIF) METACARPAL;  Surgeon: Kennedy Bucker, MD;  Location: [facility] ORS;  Service: Orthopedics;  Laterality: Left;   SEPTOPLASTY     TUBAL LIGATION      Home Medications:  Allergies as of 01/04/2023       Reactions   Codeine Itching   Erythromycin Other (See Comments)   Stomach cramps   Nitrofurantoin Other (See Comments)   Scarring of lung per pt   Shrimp Extract    Other reaction(s): Vomiting   Penicillins Rash   Has patient had a PCN reaction causing immediate rash, facial/tongue/throat swelling, SOB or lightheadedness with hypotension: Yes Has patient had a PCN reaction causing severe  rash involving mucus membranes or skin necrosis: Unknown Has patient had a PCN reaction that required hospitalization No Has patient had a PCN reaction occurring within the last 10 years: No If all of the above answers are "NO", then may proceed with Cephalosporin use.        Medication List        Accurate as of January 04, 2023 10:46 AM. If you have any questions, ask your nurse or doctor.          albuterol 108 (90 Base) MCG/ACT inhaler Commonly known as: VENTOLIN HFA 2 puffs Q4H PRN   amLODipine 10 MG tablet Commonly known as: NORVASC Take 10 mg by mouth at bedtime.   ARIPiprazole 2 MG tablet Commonly known as: ABILIFY Take 2 mg by mouth once.   ascorbic acid 500 MG tablet Commonly known as: VITAMIN C Take  500 mg by mouth daily.   aspirin 81 MG chewable tablet Chew 1 tablet (81 mg total) by mouth daily.   budesonide 0.25 MG/2ML nebulizer solution Commonly known as: PULMICORT INHALE THE CONTENTS OF 1 VIAL VIA NEBULIZER TWICE DAILY   calcium citrate-vitamin D 315-200 MG-UNIT tablet Commonly known as: CITRACAL+D Take 1 tablet by mouth 2 (two) times daily.   carvedilol 25 MG tablet Commonly known as: COREG TAKE 1 TABLET TWICE DAILY (DOSE INCREASE)   cephALEXin 250 MG capsule Commonly known as: KEFLEX Take 1 capsule (250 mg total) by mouth daily as needed (UTI).   citalopram 20 MG tablet Commonly known as: CELEXA Take 20 mg by mouth daily.   clopidogrel 75 MG tablet Commonly known as: PLAVIX TAKE 1 TABLET BY MOUTH DAILY WITH BREAKFAST   diphenhydrAMINE 25 MG tablet Commonly known as: BENADRYL Take 75 mg by mouth daily as needed for allergies.   diphenoxylate-atropine 2.5-0.025 MG tablet Commonly known as: LOMOTIL Take 1 tablet by mouth 2 (two) times daily as needed for diarrhea or loose stools.   fluticasone 50 MCG/ACT nasal spray Commonly known as: FLONASE Place 2 sprays into both nostrils 2 (two) times daily.   formoterol 20 MCG/2ML nebulizer  solution Commonly known as: PERFOROMIST USE ONE VIAL VIA NEBULIZER AND INHALE BY MOUTH TWICE DAILY   furosemide 20 MG tablet Commonly known as: LASIX Take 20 mg by mouth daily.   glipiZIDE 5 MG 24 hr tablet Commonly known as: GLUCOTROL XL Take 5 mg by mouth daily with breakfast.   hydrALAZINE 50 MG tablet Commonly known as: APRESOLINE TAKE 1 TABLET BY MOUTH THREE TIMES DAILY   hydrOXYzine 10 MG tablet Commonly known as: ATARAX Take 10 mg by mouth every 8 (eight) hours as needed for itching.   losartan 25 MG tablet Commonly known as: COZAAR Take 1 tablet by mouth daily.   Melatonin 10 MG Caps Take 20 mg by mouth at bedtime.   ondansetron 4 MG disintegrating tablet Commonly known as: ZOFRAN-ODT Take 1 tablet (4 mg total) by mouth every 8 (eight) hours as needed for nausea or vomiting.   pantoprazole 40 MG tablet Commonly known as: PROTONIX Take 40 mg by mouth daily.   PREVACID PO Take by mouth.   PROBIOTIC BLEND PO Take 1 capsule by mouth daily.   rosuvastatin 20 MG tablet Commonly known as: CRESTOR Take 1 tablet (20 mg total) by mouth at bedtime.   traMADol 50 MG tablet Commonly known as: ULTRAM Take 50 mg by mouth 3 (three) times daily as needed for moderate pain.   traZODone 150 MG tablet Commonly known as: DESYREL Take 150 mg by mouth at bedtime.   venlafaxine XR 37.5 MG 24 hr capsule Commonly known as: EFFEXOR-XR Take 37.5 mg by mouth daily.   VITAMIN B 12 PO Take 1 tablet by mouth daily.   Vitamin D3 25 MCG (1000 UT) Caps Take by mouth daily.   WOMENS MULTIVITAMIN PO Take 1 tablet by mouth daily.        Allergies:  Allergies  Allergen Reactions   Codeine Itching   Erythromycin Other (See Comments)    Stomach cramps   Nitrofurantoin Other (See Comments)    Scarring of lung per pt   Shrimp Extract     Other reaction(s): Vomiting   Penicillins Rash    Has patient had a PCN reaction causing immediate rash, facial/tongue/throat swelling,  SOB or lightheadedness with hypotension: Yes Has patient had a PCN reaction causing severe rash involving  mucus membranes or skin necrosis: Unknown Has patient had a PCN reaction that required hospitalization No Has patient had a PCN reaction occurring within the last 10 years: No If all of the above answers are "NO", then may proceed with Cephalosporin use.     Family History: Family History  Problem Relation Age of Onset   Cancer Father    Breast cancer Maternal Aunt    Breast cancer Paternal Grandmother 40    Social History:  reports that she has never smoked. She has never been exposed to tobacco smoke. She has never used smokeless tobacco. She reports that she does not drink alcohol and does not use drugs.  ROS:                                        Physical Exam: There were no vitals taken for this visit.  Constitutional:  Alert and oriented, No acute distress. HEENT: [place] AT, moist mucus membranes.  Trachea midline, no masses.   Laboratory Data: Lab Results  Component Value Date   WBC 5.0 05/06/2021   HGB 11.9 (L) 05/06/2021   HCT 36.1 05/06/2021   MCV 89.1 05/06/2021   PLT 167 05/06/2021    Lab Results  Component Value Date   CREATININE 0.57 05/06/2021    No results found for: "PSA"  No results found for: "TESTOSTERONE"  No results found for: "HGBA1C"  Urinalysis    Component Value Date/Time   APPEARANCEUR Clear 01/05/2022 1110   GLUCOSEU Negative 01/05/2022 1110   BILIRUBINUR Negative 01/05/2022 1110   PROTEINUR Negative 01/05/2022 1110   NITRITE Negative 01/05/2022 1110   LEUKOCYTESUR Negative 01/05/2022 1110    Pertinent Imaging:   Assessment & Plan: Keflex 90 x 3 sent to pharmacy and I will see in 1 year  1. Chronic cystitis  - Urinalysis, Complete   No follow-ups on file.  Martina Sinner, MD  [facility] [address], [address] [place], [place] [address] [phone]

## 2023-01-08 ENCOUNTER — Ambulatory Visit
Admission: RE | Admit: 2023-01-08 | Discharge: 2023-01-08 | Disposition: A | Discharge status: Home / Self Care | Payer: Medicare HMO | Source: Ambulatory Visit | Attending: Pulmonary Disease | Admitting: Pulmonary Disease

## 2023-01-08 ENCOUNTER — Encounter: Payer: Self-pay | Admitting: Cardiovascular Disease

## 2023-01-08 ENCOUNTER — Ambulatory Visit: Payer: Medicare HMO | Attending: Cardiovascular Disease | Admitting: Cardiovascular Disease

## 2023-01-08 VITALS — BP 120/60 | HR 68 | Ht 63.0 in | Wt 160.0 lb

## 2023-01-08 DIAGNOSIS — I10 Essential (primary) hypertension: Secondary | ICD-10-CM | POA: Diagnosis not present

## 2023-01-08 DIAGNOSIS — I25118 Atherosclerotic heart disease of native coronary artery with other forms of angina pectoris: Secondary | ICD-10-CM

## 2023-01-08 DIAGNOSIS — J8410 Pulmonary fibrosis, unspecified: Secondary | ICD-10-CM | POA: Diagnosis not present

## 2023-01-08 DIAGNOSIS — E1359 Other specified diabetes mellitus with other circulatory complications: Secondary | ICD-10-CM | POA: Diagnosis not present

## 2023-01-08 DIAGNOSIS — R062 Wheezing: Secondary | ICD-10-CM | POA: Insufficient documentation

## 2023-01-08 DIAGNOSIS — I872 Venous insufficiency (chronic) (peripheral): Secondary | ICD-10-CM | POA: Diagnosis not present

## 2023-01-08 DIAGNOSIS — J9811 Atelectasis: Secondary | ICD-10-CM | POA: Diagnosis not present

## 2023-01-08 DIAGNOSIS — E782 Mixed hyperlipidemia: Secondary | ICD-10-CM | POA: Diagnosis not present

## 2023-01-08 NOTE — Progress Notes (Signed)
Cardiology Office Note  Date:  01/08/2023   ID:  Tiffany Alexander, DOB 1945-06-15, MRN [id number]  PCP:  Tiffany Mina, MD   Chief Complaint  Patient presents with   12 month follow up     "Doing well." Medications reviewed by the patient verbally.     HPI:  [name] is a 77 year old woman with past medical history of asthma Hypertension Diabetes Coronary calcification on CT scan October 2022 mild pulmonary fibrosis Movement disorder, Morbid obesity OSA on oxygen CAD cardiac catheterization May 05, 2021, Stent placed to ostial RCA Who presents for f/u of her bradycardia, shortness of breath, coronary calcification  LOV 12/2021 Present today with her daughter  Bad fall in sept 2023 No regular exercise program, sedentary baseline Lives with husband and autistic son, they go walking daily, she does not go with them  Clarified her medications today, for unclear reasons she is on atenolol and carvedilol We did not not have atenolol on her list on prior office visits Atenolol was reduced by primary care for bradycardia  Denies any chest pain or shortness of breath concerning for angina  Weight continues to trend down, from over 200-180 on her last clinic visit now down to 160  Other lab work reviewed  A1c 5.8 Total cholesterol 115 LDL 35  EKG personally reviewed by myself on todays visit EKG Interpretation Date/Time:  Friday January 08 2023 08:25:19 EDT Ventricular Rate:  68 PR Interval:  196 QRS Duration:  92 QT Interval:  418 QTC Calculation: 444 R Axis:   -42  Text Interpretation: Normal sinus rhythm Left axis deviation When compared with ECG of 06-May-2021 05:16, No significant change was found Confirmed by Julien Nordmann [phone]) on 01/08/2023 8:32:29 AM   Other past medical history reviewed Cardiac CTA: April 24, 2021 bilateral chronic appearing peripheral predominant interstitial reticulation which may reflect sequelae of chronic  interstitial lung disease -- Calcium score 540 Severe ostial RCA disease   PMH:   has a past medical history of Arthritis, Carpal tunnel syndrome of left wrist, Depression, Diabetes mellitus without complication (HCC), Diverticulosis, GERD (gastroesophageal reflux disease), Hypertension, PONV (postoperative nausea and vomiting), PONV (postoperative nausea and vomiting), Pulmonary fibrosis (HCC), Recurrent UTI, SUI (stress urinary incontinence, female), and Wears dentures.  PSH:    Past Surgical History:  Procedure Laterality Date    endocervical curettage     ABDOMINAL HYSTERECTOMY     BLADDER SUSPENSION     CARPAL TUNNEL RELEASE Left    CARPAL TUNNEL RELEASE Right 05/14/2015   Procedure: CARPAL TUNNEL RELEASE;  Surgeon: Kennedy Bucker, MD;  Location: [facility] ORS;  Service: Orthopedics;  Laterality: Right;   CATARACT EXTRACTION W/PHACO Right 12/17/2015   Procedure: CATARACT EXTRACTION PHACO AND INTRAOCULAR LENS PLACEMENT (IOC);  Surgeon: Galen Manila, MD;  Location: [facility] ORS;  Service: Ophthalmology;  Laterality: Right;  [place] 52.0 AP% 15.4 CDE 7.99 Fluid pack lot # [id number]   CATARACT EXTRACTION W/PHACO Left 01/21/2016   Procedure: CATARACT EXTRACTION PHACO AND INTRAOCULAR LENS PLACEMENT (IOC);  Surgeon: Galen Manila, MD;  Location: [facility] ORS;  Service: Ophthalmology;  Laterality: Left;  [place] 50.4 AP% 19.6 CDE 9.92 FLUID PACK LOT # [id number]   CESAREAN SECTION N/A    COLONOSCOPY N/A 08/24/2014   Procedure: COLONOSCOPY;  Surgeon: Christena Deem, MD;  Location: [facility] ENDOSCOPY;  Service: Endoscopy;  Laterality: N/A;   COLONOSCOPY N/A 03/13/2020   Procedure: COLONOSCOPY;  Surgeon: Toledo, Boykin Nearing, MD;  Location: [facility] ENDOSCOPY;  Service: Gastroenterology;  Laterality: N/A;  COLONOSCOPY WITH PROPOFOL N/A 04/05/2015   Procedure: COLONOSCOPY WITH PROPOFOL;  Surgeon: Christena Deem, MD;  Location: [facility] ENDOSCOPY;  Service: Endoscopy;  Laterality: N/A;   COLPORRHAPHY     CORONARY STENT  INTERVENTION N/A 05/05/2021   Procedure: CORONARY STENT INTERVENTION;  Surgeon: Iran Ouch, MD;  Location: [facility] INVASIVE CV LAB;  Service: Cardiovascular;  Laterality: N/A;   EYE SURGERY     FINGER ARTHRODESIS Right 07/12/2014   Procedure: Right middle finger DIP fussion;  Surgeon: Kennedy Bucker, MD;  Location: [facility] ORS;  Service: Orthopedics;  Laterality: Right;   FRACTURE SURGERY     fractured wrist Right    HAMMER TOE SURGERY Left 04/01/2016   Procedure: HAMMER TOE CORRECTION  Left 2nd  & 3rd toe;  Surgeon: Gwyneth Revels, DPM;  Location: [facility];  Service: Podiatry;  Laterality: Left;  Special:  Hammer lock implants Left 2nd & 3rd IVA LOcal Diabetic - oral meds   LEFT HEART CATH AND CORONARY ANGIOGRAPHY Left 05/05/2021   Procedure: LEFT HEART CATH AND CORONARY ANGIOGRAPHY;  Surgeon: Iran Ouch, MD;  Location: [facility] LAB;  Service: Cardiovascular;  Laterality: Left;   OPEN REDUCTION INTERNAL FIXATION (ORIF) METACARPAL Left 05/14/2015   Procedure: OPEN REDUCTION INTERNAL FIXATION (ORIF) METACARPAL;  Surgeon: Kennedy Bucker, MD;  Location: [facility] OR[facility];  Service: Orthopedics;  Laterality: Left;   SEPTOPLASTY     TUBAL LIGATION      Current Outpatient Medications  Medication Sig Dispense Refill   albuterol (VENTOLIN HFA) 108 (90 Base) MCG/ACT inhaler 2 puffs Q4H PRN 18 g 0   amLODipine (NORVASC) 10 MG tablet Take 10 mg by mouth at bedtime.     ascorbic acid (VITAMIN C) 500 MG tablet Take 500 mg by mouth daily.     aspirin 81 MG chewable tablet Chew 1 tablet (81 mg total) by mouth daily. 90 tablet 0   budesonide (PULMICORT) 0.25 MG/2ML nebulizer solution INHALE THE CONTENTS OF 1 VIAL VIA NEBULIZER TWICE DAILY 360 mL 3   calcium citrate-vitamin D (CITRACAL+D) 315-200 MG-UNIT tablet Take 1 tablet by mouth 2 (two) times daily.     carvedilol (COREG) 25 MG tablet TAKE 1 TABLET TWICE DAILY (DOSE INCREASE) 180 tablet 2   cephALEXin (KEFLEX) 250 MG capsule Take 1 capsule  (250 mg total) by mouth daily as needed (UTI). 30 capsule 11   Cholecalciferol (VITAMIN D3) 1000 units CAPS Take by mouth daily.     Cyanocobalamin (VITAMIN B 12 PO) Take 1 tablet by mouth daily.     diphenhydrAMINE (BENADRYL) 25 MG tablet Take 75 mg by mouth daily as needed for allergies.     diphenoxylate-atropine (LOMOTIL) 2.5-0.025 MG tablet Take 1 tablet by mouth 2 (two) times daily as needed for diarrhea or loose stools.     fluticasone (FLONASE) 50 MCG/ACT nasal spray Place 2 sprays into both nostrils 2 (two) times daily.     formoterol (PERFOROMIST) 20 MCG/2ML nebulizer solution USE ONE VIAL VIA NEBULIZER AND INHALE BY MOUTH TWICE DAILY 120 mL 11   furosemide (LASIX) 20 MG tablet Take 20 mg by mouth daily as needed.     glipiZIDE (GLUCOTROL XL) 5 MG 24 hr tablet Take 5 mg by mouth daily with breakfast.     hydrALAZINE (APRESOLINE) 50 MG tablet TAKE 1 TABLET BY MOUTH THREE TIMES DAILY 270 tablet 0   hydrOXYzine (ATARAX/VISTARIL) 10 MG tablet Take 10 mg by mouth every 8 (eight) hours as needed for itching.  Melatonin 10 MG CAPS Take 20 mg by mouth at bedtime.     Multiple Vitamins-Minerals (WOMENS MULTIVITAMIN PO) Take 1 tablet by mouth daily.     ondansetron (ZOFRAN-ODT) 4 MG disintegrating tablet Take 1 tablet (4 mg total) by mouth every 8 (eight) hours as needed for nausea or vomiting. 20 tablet 0   pantoprazole (PROTONIX) 40 MG tablet Take 40 mg by mouth daily.     Probiotic Product (PROBIOTIC BLEND PO) Take 1 capsule by mouth daily.     rosuvastatin (CRESTOR) 20 MG tablet Take 1 tablet (20 mg total) by mouth at bedtime. 30 tablet 5   traMADol (ULTRAM) 50 MG tablet Take 50 mg by mouth 3 (three) times daily as needed for moderate pain.     traZODone (DESYREL) 150 MG tablet Take 150 mg by mouth at bedtime.     venlafaxine XR (EFFEXOR-XR) 37.5 MG 24 hr capsule Take 37.5 mg by mouth daily.     ARIPiprazole (ABILIFY) 2 MG tablet Take 2 mg by mouth once. (Patient not taking: Reported on  01/08/2023)     citalopram (CELEXA) 20 MG tablet Take 20 mg by mouth daily. (Patient not taking: Reported on 01/08/2023)     Lansoprazole (PREVACID PO) Take by mouth. (Patient not taking: Reported on 01/08/2023)     No current facility-administered medications for this visit.   Facility-Administered Medications Ordered in Other Visits  Medication Dose Route Frequency Provider Last Rate Last Admin   albuterol (PROVENTIL) (2.5 MG/3ML) 0.083% nebulizer solution 2.5 mg  2.5 mg Nebulization Once Merwyn Katos, MD       albuterol (PROVENTIL) (2.5 MG/3ML) 0.083% nebulizer solution 2.5 mg  2.5 mg Nebulization Once Salena Saner, MD        Allergies:   Codeine, Erythromycin, Nitrofurantoin, Shrimp extract, and Penicillins   Social History:  The patient  reports that she has never smoked. She has never been exposed to tobacco smoke. She has never used smokeless tobacco. She reports that she does not drink alcohol and does not use drugs.   Family History:   family history includes Breast cancer in her maternal aunt; Breast cancer (age of onset: 85) in her paternal grandmother; Cancer in her father.   Review of Systems: Review of Systems  Constitutional: Negative.   HENT: Negative.    Respiratory:  Positive for shortness of breath.   Cardiovascular: Negative.   Gastrointestinal: Negative.   Musculoskeletal: Negative.   Neurological: Negative.   Psychiatric/Behavioral: Negative.    All other systems reviewed and are negative.   PHYSICAL EXAM: VS:  BP 120/60 (BP Location: Left Arm, Patient Position: Sitting, Cuff Size: Normal)   Pulse 68   Ht 5\' 3"  (1.6 m)   Wt 160 lb (72.6 kg)   SpO2 97%   BMI 28.34 kg/m  , BMI Body mass index is 28.34 kg/m. Constitutional:  oriented to person, place, and time. No distress.  HENT:  Head: Grossly normal Eyes:  no discharge. No scleral icterus.  Neck: No JVD, no carotid bruits  Cardiovascular: Regular rate and rhythm, no murmurs  appreciated Pulmonary/Chest: Clear to auscultation bilaterally, no wheezes or rails Abdominal: Soft.  no distension.  no tenderness.  Musculoskeletal: Normal range of motion Neurological:  normal muscle tone. Coordination normal. No atrophy Skin: Skin warm and dry Psychiatric: normal affect, pleasant  Recent Labs: No results found for requested labs within last 365 days.    Lipid Panel No results found for: "CHOL", "HDL", "LDLCALC", "TRIG"  Wt Readings from Last 3 Encounters:  01/08/23 160 lb (72.6 kg)  01/04/23 160 lb (72.6 kg)  12/29/22 157 lb 6.4 oz (71.4 kg)     ASSESSMENT AND PLAN:  Problem List Items Addressed This Visit       Cardiology Problems   Coronary artery disease involving native coronary artery of native heart - Primary   Relevant Orders   EKG 12-Lead (Completed)   Hyperlipidemia, mixed   Chronic venous insufficiency     Other   Diabetes (HCC)   Pulmonary fibrosis (HCC)   Other Visit Diagnoses     Essential hypertension       Relevant Orders   EKG 12-Lead (Completed)      Coronary disease with stable angina Stenosis ostial RCA found on CT scan February 2023, status post PCI Currently with no symptoms of angina. No further workup at this time. Continue current medication regimen. Stop Plavix, stay on aspirin  Shortness of breath Asthma, fibrosis, followed by pulmonary She reports symptoms are stable, weight trending downward still Recommended regular walking program for conditioning  Bradycardia We did not have atenolol on her medication list in the past Unclear why she was on atenolol and carvedilol, we will stop the atenolol  Essential hypertension Recommend she hold atenolol as above, continue carvedilol, amlodipine Lasix 20 daily  Diabetes type 2 A1c well controlled, has had additional 20 pound weight loss  Hyperlipidemia Cholesterol is at goal on the current lipid regimen. No changes to the medications were  made.   Signed, Dossie Arbour, M.D., Ph.D. [facility] [place], [place] [phone]

## 2023-01-08 NOTE — Patient Instructions (Addendum)
Medication Instructions:  Please stop plavix and atenolol  If you need a refill on your cardiac medications before your next appointment, please call your pharmacy.   Lab work: No new labs needed  Testing/Procedures: No new testing needed  Follow-Up: At [facility], you and your health needs are our priority.  As part of our continuing mission to provide you with exceptional heart care, we have created designated Provider Care Teams.  These Care Teams include your primary Cardiologist (physician) and Advanced Practice Providers (APPs -  Physician Assistants and Nurse Practitioners) who all work together to provide you with the care you need, when you need it.  You will need a follow up appointment in 12 months  Providers on your designated Care Team:   Nicolasa Ducking, NP Eula Listen, PA-C Cadence Fransico Michael, [place]  COVID-19 Vaccine Information can be found at: [email or web address] For questions related to vaccine distribution or appointments, please email [email or web address] or call [phone].

## 2023-01-18 ENCOUNTER — Telehealth: Payer: Self-pay | Admitting: Pulmonary Disease

## 2023-01-18 NOTE — Telephone Encounter (Addendum)
Called and spoke to patient.  She reports of wheezing and nasal congestion x3-4wk. Wheezing worsens when laying on her side.  SOB is baseline.  Denied cough, f/c/s or additional sx.  She is using albuterol neb BID and Pulmicort BID. She would like Rx of prednisone and CXR results.   Dr. Belia Heman, please advise. Dr. Jayme Cloud is unavailable.

## 2023-01-18 NOTE — Telephone Encounter (Signed)
Lm x1 for patient.  

## 2023-01-18 NOTE — Telephone Encounter (Signed)
[place] daughter states patient still having symptoms of wheezing. Would like Prednisone called into pharmacy. Pharmacy is [facility]  [address] [place]. Also would like results of xray. Joanie phone number is [phone].

## 2023-01-19 MED ORDER — PREDNISONE 20 MG PO TABS: ORAL_TABLET | ORAL | 0 refills | Status: DC

## 2023-01-19 NOTE — Telephone Encounter (Signed)
Returning missed call

## 2023-01-19 NOTE — Telephone Encounter (Signed)
Called and spoke with patient's daughter (DPR), provided results/recommendations per Dr. Belia Heman.  She verbalized understanding.  Verified pharmacy as [facility] and script sent.  Nothing further needed.

## 2023-02-13 ENCOUNTER — Other Ambulatory Visit: Payer: Self-pay | Admitting: Cardiovascular Disease

## 2023-02-15 NOTE — Telephone Encounter (Signed)
last visit: 01/08/23 with plan to f/u in 12 months   Next visit: active recall

## 2023-03-09 DIAGNOSIS — M3501 Sicca syndrome with keratoconjunctivitis: Secondary | ICD-10-CM | POA: Diagnosis not present

## 2023-03-09 DIAGNOSIS — E119 Type 2 diabetes mellitus without complications: Secondary | ICD-10-CM | POA: Diagnosis not present

## 2023-03-09 DIAGNOSIS — H26491 Other secondary cataract, right eye: Secondary | ICD-10-CM | POA: Diagnosis not present

## 2023-03-09 DIAGNOSIS — Z0100 Encounter for examination of eyes and vision without abnormal findings: Secondary | ICD-10-CM | POA: Diagnosis not present

## 2023-03-22 DIAGNOSIS — I2510 Atherosclerotic heart disease of native coronary artery without angina pectoris: Secondary | ICD-10-CM | POA: Diagnosis not present

## 2023-03-22 DIAGNOSIS — Z0000 Encounter for general adult medical examination without abnormal findings: Secondary | ICD-10-CM | POA: Diagnosis not present

## 2023-03-22 DIAGNOSIS — E782 Mixed hyperlipidemia: Secondary | ICD-10-CM | POA: Diagnosis not present

## 2023-03-22 DIAGNOSIS — J8410 Pulmonary fibrosis, unspecified: Secondary | ICD-10-CM | POA: Diagnosis not present

## 2023-03-22 DIAGNOSIS — I10 Essential (primary) hypertension: Secondary | ICD-10-CM | POA: Diagnosis not present

## 2023-03-22 DIAGNOSIS — E119 Type 2 diabetes mellitus without complications: Secondary | ICD-10-CM | POA: Diagnosis not present

## 2023-03-22 DIAGNOSIS — Z1331 Encounter for screening for depression: Secondary | ICD-10-CM | POA: Diagnosis not present

## 2023-04-20 ENCOUNTER — Other Ambulatory Visit: Payer: Self-pay | Admitting: Cardiovascular Disease

## 2023-04-21 ENCOUNTER — Ambulatory Visit: Payer: Medicare HMO | Admitting: Pulmonary Disease

## 2023-04-28 ENCOUNTER — Ambulatory Visit: Payer: Medicare HMO | Admitting: Pulmonary Disease

## 2023-05-14 ENCOUNTER — Encounter: Payer: Self-pay | Admitting: Pulmonary Disease

## 2023-05-14 ENCOUNTER — Ambulatory Visit (INDEPENDENT_AMBULATORY_CARE_PROVIDER_SITE_OTHER): Payer: Medicare HMO | Admitting: Pulmonary Disease

## 2023-05-14 VITALS — BP 136/66 | HR 68 | Temp 97.6°F | Ht 63.0 in | Wt 161.0 lb

## 2023-05-14 DIAGNOSIS — J45909 Unspecified asthma, uncomplicated: Secondary | ICD-10-CM

## 2023-05-14 DIAGNOSIS — J8410 Pulmonary fibrosis, unspecified: Secondary | ICD-10-CM | POA: Diagnosis not present

## 2023-05-14 DIAGNOSIS — I25118 Atherosclerotic heart disease of native coronary artery with other forms of angina pectoris: Secondary | ICD-10-CM | POA: Diagnosis not present

## 2023-05-14 DIAGNOSIS — R0902 Hypoxemia: Secondary | ICD-10-CM

## 2023-05-14 DIAGNOSIS — J4540 Moderate persistent asthma, uncomplicated: Secondary | ICD-10-CM

## 2023-05-14 MED ORDER — ARFORMOTEROL TARTRATE 15 MCG/2ML IN NEBU: 15.0000 ug | INHALATION_SOLUTION | Freq: Two times a day (BID) | RESPIRATORY_TRACT | 11 refills | Status: AC

## 2023-05-14 MED ORDER — BUDESONIDE 0.25 MG/2ML IN SUSP: 0.2500 mg | Freq: Two times a day (BID) | RESPIRATORY_TRACT | 3 refills | Status: DC

## 2023-05-14 NOTE — Patient Instructions (Signed)
 VISIT SUMMARY:  During today's visit, we reviewed your asthma and pulmonary fibrosis management. Your asthma is well-controlled, and your pulmonary fibrosis remains stable. We also discussed your coronary artery disease and hypertension, both of which are well-managed with your current treatments. We explored cost-saving options for your medications and made some adjustments to your prescriptions.  YOUR PLAN:  -PULMONARY FIBROSIS: Pulmonary fibrosis is a condition where the lung tissue becomes scarred and stiff, making it difficult to breathe. Your condition is minimal and stable.   -ASTHMA: Asthma is a chronic condition that affects the airways in your lungs, causing them to become inflamed and narrow. Your asthma is well-controlled with your current treatments. Please continue using your nebulizer and nighttime oxygen as prescribed. We discussed switching from Performist to [facility] and from budesonide to [facility] to help reduce medication costs.   -CORONARY ARTERY DISEASE (CAD): Coronary artery disease is a condition where the blood vessels that supply your heart with blood become narrowed or blocked. Your condition is stable, and your circulation has improved after previous interventions. Continue monitoring your cardiac status as advised by your cardiologist.  -HYPERTENSION: Hypertension, or high blood pressure, is a condition where the force of the blood against your artery walls is too high. Your blood pressure is well-managed with your current medications. Please continue taking your blood pressure medications as prescribed.  INSTRUCTIONS:  Please follow up in six months. You will have access to follow-up details through MyChart.

## 2023-05-14 NOTE — Progress Notes (Signed)
 Subjective:    Patient ID: Tiffany Alexander, female    DOB: 1945/07/05, 78 y.o.   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  She was last seen on 29 December 2022. This is a scheduled visit.  She follows here for the issue of her asthma/asthmatic bronchitis.  Does not endorse any major issues since her last visit.  HPI HPIDiscussed the use of AI scribe software for clinical note transcription with the patient, who gave verbal consent to proceed.  History of Present Illness   The patient presents for follow-up of asthma and pulmonary fibrosis.  She is managing her asthma well and is asymptomatic. She adheres to her nebulizer treatments and uses oxygen at night. Her sister had a similar experience of feeling well and discontinuing medication, which led to a recurrence of symptoms.  She states she has not "dared" to stop her medications.  She recalls a chest x-ray from her last visit showing some atelectasis. She was prescribed prednisone for wheezing, which resolved her symptoms. She was diagnosed with pulmonary fibrosis five years ago and was initially given a poor prognosis. She has not smoked and believes her condition is stable and minimal.  Her pulmonary fibrosis was related to nitrofurantoin use and does not appear to be progressive.  She follows with cardiology for hypertension and coronary artery disease.  Her blood pressure improved after adjusting her medication, which was previously too high. She is now on three medications for blood pressure.   She previously was noted to require cardiac PCI and her respiratory status has improved significantly since then.  She discusses medication costs and is considering switching to a generic version of Performist, similar to [place]. She uses budesonide in her nebulizer and is exploring cost-saving options for her medications.   DATA 07/08/17 PFTs: FVC 2.61 L, 98% predicted.  FEV1 2.17 L, 106% predicted.  FEV1 ratio 83%.  TLC 77% predicted.  DLCO 71% predicted.  DLCO/VA 125% predicted 08/03/17 HRCT chest: Very mild nonspecific interstitial changes with basilar predominance.  Multiple tiny pulmonary nodules scattered throughout the lungs bilaterally measuring 5 mm or less in size, nonspecific but statistically likely benign.  01/04/18 PFTs: FVC: 2.56 > 2.63 L (97 > 99 %pred), FEV1: 2.08 > 2.13 L (102 > 105 %pred), FEV1/FVC: 81%, TLC: 4.29 L (87 %pred), DLCO 87 %pred 11/08/2018: Eastern allergen profile was negative, total IgE not done.  CBC with differential showed no eosinophilia. 08/24/2019 PFTs: FEV1 1.88 L or 93% predicted, FEV1/FVC 86%.  No bronchodilator response. Volumes normal with the exception of low ERV consistent with obesity.  Patient capacity normal. Overall stable study when compared to previous. 12/19/2020 PFTs: FEV1 1.89 L or 97% predicted, FVC 2.28 L or 87% predicted, FEV1/FVC 74%, no bronchodilator response.  Lung volumes normal, ERV low consistent with obesity.  Diffusion capacity normal.  Overall stable study. 12/27/2020 high resolution CT: Fibrotic changes that remain extremely mild at this time, multiple small stable 5 mm nodules.  Coronary calcifications 01/09/2021 echocardiogram: LVEF 60 to 65% grade 1 DD, normal right ventricular function, no pulmonary hypertension.  Aortic sclerosis without stenosis.  04/24/2021 cardiac CT: Discrepancy between significant stenosis in the RCA and FFR (Fractional Flow Reserve), cardiac cath recommended. 05/05/2021 left heart cath and PCI:  Significant finding of 90% stenosis of the RCA, patient required drug-eluting stent to RCA.  Mild to moderate disease affecting left coronary artery system, will require medical therapy, LVEF 55 to 65%. 01/08/2023 chest x-ray PA and lateral: Mild atelectasis at the bases, no other abnormalities.  Review of Systems A 10 point review of systems was performed and it is as noted above otherwise negative.   Patient Active Problem List   Diagnosis Date Noted   Situational depression 11/11/2021   Effort angina (HCC) 05/05/2021   Lymphedema 09/25/2020   Chronic venous insufficiency 09/25/2020   Recurrent urinary tract infection 11/25/2017   Coronary artery disease involving native coronary artery of native heart 08/24/2017   Hyperlipidemia, mixed 08/24/2017   SOBOE (shortness of breath on exertion) 08/24/2017   Positive ANA (antinuclear antibody) 08/20/2017   Pulmonary fibrosis (HCC) 08/20/2017   Status post finger joint fusion 07/16/2014   Allergic rhinitis 05/01/2013   Diabetes (HCC) 05/01/2013   HTN (hypertension) 05/01/2013   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   Lower urinary tract infectious disease 04/05/2013   Prolapse of female pelvic organs 11/04/2012   Urge incontinence 06/27/2012   Chronic cystitis 01/05/2012   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/05/2012   Incomplete emptying of bladder 01/05/2012    Social History   Tobacco Use   Smoking status: Never    Passive exposure: Never   Smokeless tobacco: Never  Substance Use Topics   Alcohol use: No    Allergies  Allergen Reactions   Codeine Itching   Erythromycin Other (See Comments)    Stomach cramps   Nitrofurantoin Other (See Comments)    Scarring of lung per pt   Shrimp Extract     Other reaction(s): Vomiting   Penicillins Rash    Has patient had a PCN reaction causing immediate rash, facial/tongue/throat swelling, SOB or lightheadedness with hypotension: Yes Has patient had a PCN reaction causing severe rash  involving mucus membranes or skin necrosis: Unknown Has patient had a PCN reaction that required hospitalization No Has patient had a PCN reaction occurring within the last 10 years: No If all of the above answers are "NO", then may proceed with Cephalosporin use.     Current Meds  Medication Sig   albuterol (VENTOLIN HFA) 108 (90 Base) MCG/ACT inhaler 2 puffs Q4H PRN   amLODipine (NORVASC) 10 MG tablet Take 10 mg by mouth at bedtime.   arformoterol (BROVANA) 15 MCG/2ML NEBU Take 2 mLs (15 mcg total) by nebulization 2 (two) times daily.   ARIPiprazole (ABILIFY) 2 MG tablet Take 2 mg by mouth once.   ascorbic acid (VITAMIN C) 500 MG tablet Take 500 mg by mouth daily.   aspirin 81 MG chewable tablet Chew 1 tablet (81 mg total) by mouth daily.   calcium citrate-vitamin D (CITRACAL+D) 315-200 MG-UNIT tablet Take 1 tablet by mouth 2 (two) times daily.   carvedilol (COREG) 25 MG tablet TAKE ONE TABLET BY MOUTH TWICE DAILY   cephALEXin (KEFLEX) 250 MG capsule Take 1 capsule (250 mg total) by mouth daily as needed (UTI).   Cholecalciferol (VITAMIN D3) 1000 units CAPS Take by mouth daily.   citalopram (CELEXA) 20 MG tablet Take 20 mg by mouth daily.   Cyanocobalamin (VITAMIN B 12 PO) Take 1 tablet by mouth daily.   diphenhydrAMINE (BENADRYL) 25 MG tablet Take 75 mg by mouth daily as  needed for allergies.   diphenoxylate-atropine (LOMOTIL) 2.5-0.025 MG tablet Take 1 tablet by mouth 2 (two) times daily as needed for diarrhea or loose stools.   fluticasone (FLONASE) 50 MCG/ACT nasal spray Place 2 sprays into both nostrils 2 (two) times daily.   furosemide (LASIX) 20 MG tablet Take 20 mg by mouth daily as needed.   glipiZIDE (GLUCOTROL XL) 5 MG 24 hr tablet Take 5 mg by mouth daily with breakfast.   hydrALAZINE (APRESOLINE) 50 MG tablet TAKE 1 TABLET BY MOUTH THREE TIMES DAILY   hydrOXYzine (ATARAX/VISTARIL) 10 MG tablet Take 10 mg by mouth every 8 (eight) hours as needed for itching.   Lansoprazole  (PREVACID PO) Take by mouth.   Melatonin 10 MG CAPS Take 20 mg by mouth at bedtime.   Multiple Vitamins-Minerals (WOMENS MULTIVITAMIN PO) Take 1 tablet by mouth daily.   ondansetron (ZOFRAN-ODT) 4 MG disintegrating tablet Take 1 tablet (4 mg total) by mouth every 8 (eight) hours as needed for nausea or vomiting.   pantoprazole (PROTONIX) 40 MG tablet Take 40 mg by mouth daily.   predniSONE (DELTASONE) 20 MG tablet Take prednisone 20 mg daily for 7 days.   Probiotic Product (PROBIOTIC BLEND PO) Take 1 capsule by mouth daily.   rosuvastatin (CRESTOR) 20 MG tablet Take 1 tablet (20 mg total) by mouth at bedtime.   traMADol (ULTRAM) 50 MG tablet Take 50 mg by mouth 3 (three) times daily as needed for moderate pain.   venlafaxine XR (EFFEXOR-XR) 37.5 MG 24 hr capsule Take 37.5 mg by mouth daily.   [DISCONTINUED] budesonide (PULMICORT) 0.25 MG/2ML nebulizer solution INHALE THE CONTENTS OF 1 VIAL VIA NEBULIZER TWICE DAILY   [DISCONTINUED] formoterol (PERFOROMIST) 20 MCG/2ML nebulizer solution USE ONE VIAL VIA NEBULIZER AND INHALE BY MOUTH TWICE DAILY    Immunization History  Administered Date(s) Administered   Fluad Quad(high Dose 65+) 11/07/2021   Influenza, High Dose Seasonal PF 12/25/2014, 12/21/2016, 11/25/2017, 12/17/2018   PFIZER Comirnaty(Gray Top)Covid-19 Tri-Sucrose Vaccine 03/27/2019, 04/17/2019   PFIZER(Purple Top)SARS-COV-2 Vaccination 03/27/2019, 04/17/2019   Pneumococcal Conjugate-13 12/25/2014   Pneumococcal Polysaccharide-23 08/17/2017   Tdap 11/22/2021        Objective:     BP 136/66 (BP Location: Right Arm, Patient Position: Sitting, Cuff Size: Normal)   Pulse 68   Temp 97.6 F (36.4 C) (Temporal)   Ht 5\' 3"  (1.6 m)   Wt 161 lb (73 kg)   SpO2 95%   BMI 28.52 kg/m   SpO2: 95 %  GENERAL: Awake, alert, obese woman, fully ambulatory today, no respiratory distress.  Akathisia noted.  No conversational dyspnea. HEAD: Normocephalic, atraumatic.  EYES: Pupils equal,  round, reactive to light.  No scleral icterus.  MOUTH: Poor dentition, oral mucosa moist. NECK: Supple. No thyromegaly. Trachea midline. No JVD.  No adenopathy.  PULMONARY: Excellent air entry bilaterally.  Lungs clear to auscultation bilaterally.  CARDIOVASCULAR: S1 and S2. Regular rate and rhythm.  No rubs, murmurs or gallops heard. GASTROINTESTINAL: Benign. MUSCULOSKELETAL: No joint deformity, no clubbing, no edema.  No calf tenderness. NEUROLOGIC: No overt focal deficit.  Involuntary athetoid type movements.  No tremor. Speech is fluent.  Gait was noted to have minimal instability today.   SKIN: Intact,warm,dry.  On limited exam no rashes. PSYCH: Mood and behavior normal.   Assessment & Plan:     ICD-10-CM   1. Very mild pulmonary fibrosis (HCC)  J84.10     2. Moderate persistent asthma without complication  J45.40 budesonide (PULMICORT) 0.25 MG/2ML nebulizer  solution   Believed to have been due to Macrodantin use in the past Query postinflammatory element post COVID-19    3. 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 Coronary artery disease of native artery of native heart with stable angina pectoris (HCC)  I25.118      Meds ordered this encounter  Medications   arformoterol (BROVANA) 15 MCG/2ML NEBU    Sig: Take 2 mLs (15 mcg total) by nebulization 2 (two) times daily.    Dispense:  360 mL    Refill:  11   budesonide (PULMICORT) 0.25 MG/2ML nebulizer solution    Sig: Take 2 mLs (0.25 mg total) by nebulization 2 (two) times daily.    Dispense:  360 mL    Refill:  3   Discussion:    Pulmonary Fibrosis Pulmonary fibrosis is minimal and well-managed. No progression noted. Significant secondhand smoke exposure. Discussed switching from Performist to [place] and continuing budesonide.  Patient will switch pharmacy to [facility] for cost savings. - Switch Per[facility] to [facility] for cost savings - Switch budesonide prescription to [facility] for cost  savings  Asthma Asthma is well-controlled with adherence to nebulizer treatments and nighttime oxygen use. No current symptoms or exacerbations. - Continue nebulizer treatments - Continue nighttime oxygen use  Coronary Artery Disease (CAD) CAD with previous interventions for blockages. Current status is stable with improved circulation post-intervention. Cardiologist indicated she was very stable post PCI. - Continue monitoring cardiac status  Hypertension Hypertension is well-managed with current medication regimen after previous adjustments. - Continue current blood pressure medication regimen  Follow-up - Follow-up in six months - Provide MyChart access for follow-up details.     Advised if symptoms do not improve or worsen, to please contact office for sooner follow up or seek emergency care.    I spent 31 minutes of dedicated to the care of this patient on the date of this encounter to include pre-visit review of records, face-to-face time with the patient discussing conditions above, post visit ordering of testing, clinical documentation with the electronic health record, making appropriate referrals as documented, and communicating necessary findings to members of the patients care team.     C. Danice Goltz, MD Advanced Bronchoscopy PCCM  [facility]    *This note was generated using voice recognition software/Dragon and/or AI transcription program.  Despite best efforts to proofread, errors can occur which can change the meaning. Any transcriptional errors that result from this process are unintentional and may not be fully corrected at the time of dictation.

## 2023-05-31 ENCOUNTER — Encounter: Payer: Self-pay | Admitting: Pulmonary Disease

## 2023-06-10 ENCOUNTER — Other Ambulatory Visit: Payer: Self-pay

## 2023-06-10 MED ORDER — CEPHALEXIN 250 MG PO CAPS: 250.0000 mg | ORAL_CAPSULE | Freq: Every day | ORAL | 6 refills | Status: DC | PRN

## 2023-06-15 ENCOUNTER — Other Ambulatory Visit: Payer: Self-pay

## 2023-06-15 MED ORDER — CARVEDILOL 25 MG PO TABS: 25.0000 mg | ORAL_TABLET | Freq: Two times a day (BID) | ORAL | 2 refills | Status: DC

## 2023-07-20 ENCOUNTER — Other Ambulatory Visit: Payer: Self-pay

## 2023-07-20 MED ORDER — HYDRALAZINE HCL 50 MG PO TABS: 50.0000 mg | ORAL_TABLET | Freq: Three times a day (TID) | ORAL | 3 refills | Status: AC

## 2023-08-06 ENCOUNTER — Other Ambulatory Visit: Payer: Self-pay | Admitting: Family Medicine

## 2023-08-06 DIAGNOSIS — Z1231 Encounter for screening mammogram for malignant neoplasm of breast: Secondary | ICD-10-CM

## 2023-09-13 DIAGNOSIS — I10 Essential (primary) hypertension: Secondary | ICD-10-CM | POA: Diagnosis not present

## 2023-09-13 DIAGNOSIS — E1359 Other specified diabetes mellitus with other circulatory complications: Secondary | ICD-10-CM | POA: Diagnosis not present

## 2023-09-13 DIAGNOSIS — E782 Mixed hyperlipidemia: Secondary | ICD-10-CM | POA: Diagnosis not present

## 2023-09-17 ENCOUNTER — Ambulatory Visit
Admission: RE | Admit: 2023-09-17 | Discharge: 2023-09-17 | Disposition: A | Discharge status: Home / Self Care | Source: Ambulatory Visit | Attending: Family Medicine | Admitting: Family Medicine

## 2023-09-17 DIAGNOSIS — Z1231 Encounter for screening mammogram for malignant neoplasm of breast: Secondary | ICD-10-CM | POA: Insufficient documentation

## 2023-09-20 DIAGNOSIS — R635 Abnormal weight gain: Secondary | ICD-10-CM | POA: Diagnosis not present

## 2023-09-20 DIAGNOSIS — I10 Essential (primary) hypertension: Secondary | ICD-10-CM | POA: Diagnosis not present

## 2023-09-20 DIAGNOSIS — I2510 Atherosclerotic heart disease of native coronary artery without angina pectoris: Secondary | ICD-10-CM | POA: Diagnosis not present

## 2023-09-20 DIAGNOSIS — F4321 Adjustment disorder with depressed mood: Secondary | ICD-10-CM | POA: Diagnosis not present

## 2023-09-20 DIAGNOSIS — R350 Frequency of micturition: Secondary | ICD-10-CM | POA: Diagnosis not present

## 2023-09-20 DIAGNOSIS — E782 Mixed hyperlipidemia: Secondary | ICD-10-CM | POA: Diagnosis not present

## 2023-09-20 DIAGNOSIS — E119 Type 2 diabetes mellitus without complications: Secondary | ICD-10-CM | POA: Diagnosis not present

## 2023-10-06 DIAGNOSIS — G8929 Other chronic pain: Secondary | ICD-10-CM | POA: Diagnosis not present

## 2023-10-06 DIAGNOSIS — M25511 Pain in right shoulder: Secondary | ICD-10-CM | POA: Diagnosis not present

## 2023-10-06 DIAGNOSIS — M25512 Pain in left shoulder: Secondary | ICD-10-CM | POA: Diagnosis not present

## 2023-12-01 ENCOUNTER — Ambulatory Visit: Admitting: Pulmonary Disease

## 2023-12-01 ENCOUNTER — Encounter: Payer: Self-pay | Admitting: Pulmonary Disease

## 2023-12-01 VITALS — BP 134/68 | HR 72 | Temp 97.6°F | Ht 63.0 in | Wt 172.6 lb

## 2023-12-01 DIAGNOSIS — J45909 Unspecified asthma, uncomplicated: Secondary | ICD-10-CM

## 2023-12-01 DIAGNOSIS — R0902 Hypoxemia: Secondary | ICD-10-CM | POA: Diagnosis not present

## 2023-12-01 DIAGNOSIS — J4540 Moderate persistent asthma, uncomplicated: Secondary | ICD-10-CM

## 2023-12-01 DIAGNOSIS — I25118 Atherosclerotic heart disease of native coronary artery with other forms of angina pectoris: Secondary | ICD-10-CM

## 2023-12-01 NOTE — Patient Instructions (Signed)
 Continue using your nebulizer medications, you are doing well on them.  Your lungs sounded clear today.  We are repeating the oxygen  level at nighttime on room air, make sure you do not wear the oxygen  the night that you do the oxygen  test so we can see if we can wean you off of the oxygen .  Please make sure that you get your flu vaccine in October as you plan.  We will see you in follow-up in 6 months time call sooner should any new problems arise.

## 2023-12-01 NOTE — Progress Notes (Signed)
 Subjective:    Patient ID: Tiffany Alexander, female    DOB: Mar 26, 1945, 78 y.o.   MRN: [id number]  Patient Care Team: Valora Lynwood FALCON, MD as PCP - General (Family Medicine) Darron Deatrice LABOR, MD as Consulting Physician (Cardiology) Tamea Dedra CROME, MD as Consulting Physician (Pulmonary Disease)  Chief Complaint  Patient presents with   Asthma    No breathing problems.     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  She was last seen on 14 May 2023. This is a scheduled visit.  She follows here for the issue of her asthma/asthmatic bronchitis.  Does not endorse any major issues since her last visit.   HPI Discussed the use of AI scribe software for clinical note transcription with the patient, who gave verbal consent to proceed.  History of Present Illness   Tiffany Alexander is an 78 year old female with persistent asthma who presents for follow-up.  She presents with her husband Tiffany Alexander.  Her shortness of breath is currently well-controlled. She has a history of using a wheelchair but is now more mobile. Her heart doctor told her that her heart checked out okay. She reports that her shortness of breath is currently well-controlled.  Her medication regimen has been adjusted significantly. Previously, she was on eight blood pressure medications, which have been reduced to three. Additionally, she was taking three heart medications of the same kind, now reduced to one. She is currently on five prescription medications and takes a lot of vitamins.  She uses oxygen  at night, provided by [employer], and mentions that the oxygen  flow is very low, and she can barely feel it.  She is however compliant with it.  Notes benefit of the therapy.  She has not yet received her flu shot, as her daughter advised her to wait until October.   Overall she feels well and looks well.     DATA 07/08/17 PFTs: FVC 2.61 L, 98% predicted.  FEV1 2.17 L, 106% predicted.  FEV1 ratio 83%.  TLC 77% predicted.  DLCO 71% predicted.  DLCO/VA 125% predicted 08/03/17 HRCT chest: Very mild nonspecific interstitial changes with basilar predominance.  Multiple tiny pulmonary nodules scattered throughout the lungs bilaterally measuring 5 mm or less in size, nonspecific but statistically likely benign.  01/04/18 PFTs: FVC: 2.56 > 2.63 L (97 > 99 %pred), FEV1: 2.08 > 2.13 L (102 > 105 %pred), FEV1/FVC: 81%, TLC: 4.29 L (87 %pred), DLCO 87 %pred 11/08/2018: Eastern allergen profile was negative, total IgE not done.  CBC with differential showed no eosinophilia. 08/24/2019 PFTs: FEV1 1.88 L or 93% predicted, FEV1/FVC 86%.  No bronchodilator response. Volumes normal with the exception of low ERV consistent with obesity.  Patient capacity normal. Overall stable study when compared to previous. 12/19/2020 PFTs: FEV1 1.89 L or 97% predicted, FVC 2.28 L or 87% predicted, FEV1/FVC 74%, no bronchodilator response.  Lung volumes normal, ERV low consistent with obesity.  Diffusion capacity normal.  Overall stable study. 12/27/2020 high resolution CT: Fibrotic changes that remain extremely mild at this time, multiple small stable 5 mm nodules.  Coronary calcifications 01/09/2021 echocardiogram: LVEF 60 to 65% grade 1 DD, normal right ventricular function, no pulmonary hypertension.  Aortic sclerosis without stenosis. 04/24/2021 cardiac CT: Discrepancy between significant stenosis in the RCA and FFR (Fractional Flow Reserve), cardiac cath recommended. 05/05/2021 left heart cath and PCI:  Significant finding of 90% stenosis of the RCA, patient required drug-eluting stent to RCA.  Mild to moderate disease affecting left coronary artery system, will require medical therapy, LVEF 55 to 65%. 01/08/2023 chest x-ray PA and lateral: Mild atelectasis at the bases, no other  abnormalities.   Review of Systems A 10 point review of systems was performed and it is as noted above otherwise negative.   Patient Active Problem List   Diagnosis Date Noted   Situational depression 11/11/2021   Effort angina 05/05/2021   Lymphedema 09/25/2020   Chronic venous insufficiency 09/25/2020   Recurrent urinary tract infection 11/25/2017   Coronary artery disease involving native coronary artery of native heart 08/24/2017   Hyperlipidemia, mixed 08/24/2017   SOBOE (shortness of breath on exertion) 08/24/2017   Positive ANA (antinuclear antibody) 08/20/2017   Pulmonary fibrosis (HCC) 08/20/2017   Status post finger joint fusion 07/16/2014   Allergic rhinitis 05/01/2013   Diabetes (HCC) 05/01/2013   HTN (hypertension) 05/01/2013   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   Lower urinary tract infectious disease 04/05/2013   Prolapse of female pelvic organs 11/04/2012   Urge incontinence 06/27/2012   Chronic cystitis 01/05/2012   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/05/2012   Incomplete emptying of bladder 01/05/2012    Social History   Tobacco Use   Smoking status: Never    Passive exposure: Never   Smokeless tobacco: Never  Substance Use Topics   Alcohol use: No    Allergies  Allergen Reactions   Codeine Itching   Erythromycin Other (See Comments)    Stomach cramps   Nitrofurantoin Other (See Comments)    Scarring of lung per pt   Shrimp Extract     Other reaction(s): Vomiting   Penicillins Rash    Has patient had a PCN reaction causing immediate rash, facial/tongue/throat swelling, SOB or lightheadedness with hypotension: Yes Has patient had a PCN reaction causing severe rash involving mucus membranes or skin necrosis: Unknown Has patient had a PCN reaction that required hospitalization No Has patient had a PCN reaction occurring within the last 10 years: No If all of the above answers are NO, then may proceed with Cephalosporin use.     Current Meds   Medication Sig   amLODipine  (NORVASC ) 10 MG tablet Take 10 mg by mouth at bedtime.   arformoterol  (BROVANA ) 15 MCG/2ML NEBU Take 2 mLs (15 mcg total) by nebulization 2 (two) times daily.   ARIPiprazole (ABILIFY) 2 MG tablet Take 2 mg by mouth once.   ascorbic acid  (VITAMIN C) 500 MG tablet Take 500 mg by mouth daily.   aspirin  81 MG chewable tablet Chew 1 tablet (81 mg total) by mouth daily.   budesonide  (PULMICORT ) 0.25 MG/2ML nebulizer solution Take 2 mLs (0.25 mg total) by nebulization 2 (two) times daily.   calcium  citrate-vitamin D (CITRACAL+D) 315-200 MG-UNIT tablet Take 1 tablet by mouth 2 (two) times daily.   carvedilol  (COREG ) 25 MG tablet Take 1 tablet (25 mg total) by mouth 2 (two) times daily.   cephALEXin  (KEFLEX ) 250 MG capsule Take 1 capsule (250 mg total) by mouth daily as needed (UTI).   Cholecalciferol (VITAMIN D3) 1000 units CAPS Take by mouth daily.   citalopram  (CELEXA ) 20 MG tablet Take 20 mg by mouth daily.   Cyanocobalamin (VITAMIN B 12 PO) Take 1 tablet by mouth daily.   diphenhydrAMINE (BENADRYL) 25 MG tablet Take 75 mg by mouth daily as needed for allergies.   diphenoxylate-atropine (LOMOTIL) 2.5-0.025 MG tablet Take 1 tablet  by mouth 2 (two) times daily as needed for diarrhea or loose stools.   fluticasone  (FLONASE ) 50 MCG/ACT nasal spray Place 2 sprays into both nostrils 2 (two) times daily.   furosemide  (LASIX ) 20 MG tablet Take 20 mg by mouth daily as needed.   glipiZIDE  (GLUCOTROL  XL) 5 MG 24 hr tablet Take 5 mg by mouth daily with breakfast.   hydrALAZINE  (APRESOLINE ) 50 MG tablet Take 1 tablet (50 mg total) by mouth 3 (three) times daily.   hydrOXYzine  (ATARAX /VISTARIL ) 10 MG tablet Take 10 mg by mouth every 8 (eight) hours as needed for itching.   Lansoprazole (PREVACID PO) Take by mouth.   Melatonin 10 MG CAPS Take 20 mg by mouth at bedtime.   Multiple Vitamins-Minerals (WOMENS MULTIVITAMIN PO) Take 1 tablet by mouth daily.   ondansetron  (ZOFRAN -ODT) 4 MG  disintegrating tablet Take 1 tablet (4 mg total) by mouth every 8 (eight) hours as needed for nausea or vomiting.   OXYGEN  Inhale 2 L into the lungs at bedtime.   Probiotic Product (PROBIOTIC BLEND PO) Take 1 capsule by mouth daily.   rosuvastatin  (CRESTOR ) 20 MG tablet Take 1 tablet (20 mg total) by mouth at bedtime.   traMADol  (ULTRAM ) 50 MG tablet Take 50 mg by mouth 3 (three) times daily as needed for moderate pain.   venlafaxine XR (EFFEXOR-XR) 37.5 MG 24 hr capsule Take 37.5 mg by mouth daily.    Immunization History  Administered Date(s) Administered   Fluad Quad(high Dose 65+) 11/07/2021   INFLUENZA, HIGH DOSE SEASONAL PF 12/25/2014, 12/21/2016, 11/25/2017, 12/17/2018   PFIZER Comirnaty(Gray Top)Covid-19 Tri-Sucrose Vaccine 03/27/2019, 04/17/2019   PFIZER(Purple Top)SARS-COV-2 Vaccination 03/27/2019, 04/17/2019   Pneumococcal Conjugate-13 12/25/2014   Pneumococcal Polysaccharide-23 08/17/2017   Tdap 11/22/2021        Objective:     BP 134/68   Pulse 72   Temp 97.6 F (36.4 C) (Temporal)   Ht 5' 3 (1.6 m)   Wt 172 lb 9.6 oz (78.3 kg)   SpO2 96%   BMI 30.57 kg/m   SpO2: 96 %  GENERAL: Awake, alert, overweight woman, fully ambulatory, no respiratory distress.  Akathisia noted.  No conversational dyspnea. HEAD: Normocephalic, atraumatic.  EYES: Pupils equal, round, reactive to light.  No scleral icterus.  MOUTH: Edentulous, wears dentures, oral mucosa moist. NECK: Supple. No thyromegaly. Trachea midline. No JVD.  No adenopathy.  PULMONARY: Excellent air entry bilaterally.  Lungs clear to auscultation bilaterally.  CARDIOVASCULAR: S1 and S2. Regular rate and rhythm.  No rubs, murmurs or gallops heard. GASTROINTESTINAL: Benign. MUSCULOSKELETAL: No joint deformity, no clubbing, no edema.  No calf tenderness. NEUROLOGIC: No overt focal deficit.  Involuntary athetoid type movements.  No tremor. Speech is fluent.  Gait was noted to have minimal instability today.   SKIN:  Intact,warm,dry.  On limited exam no rashes. PSYCH: Mood and behavior normal.        Assessment & Plan:     ICD-10-CM   1. Moderate persistent asthma without complication  J45.40     2. Nocturnal hypoxemia due to asthma  R09.02 Overnight Pulse Oximetry Study   J45.909     3. Coronary artery disease of native artery of native heart with stable angina pectoris  I25.118       Orders Placed This Encounter  Procedures   Overnight Pulse Oximetry Study    Standing Status:   Future    Expiration Date:   11/30/2024    Scheduling Instructions:     On room air  DME: [employer]   Discussion:    Persistent asthma Asthma is well-controlled with no reported dyspnea.  Mobility has improved,she is no longer using a wheelchair. - Emphasize adherence to prescribed medication regimens without skipping doses.  Nocturnal supplemental oxygen  use for hypoxemia monitoring Currently using nocturnal supplemental oxygen . Discussed potential weaning off oxygen  as cardiac issues have been addressed. - Order nocturnal oximetry test without supplemental oxygen . - Coordinate with oxygen  provider for testing equipment.     Overall Tiffany Alexander is doing well.  She appears well compensated and is adherent to her medications and to oxygen  therapy.  She continues follow-up with cardiology for her coronary artery disease.  We will see her in follow-up in 6 months time she is to contact us  prior to that time should any new difficulties arise.    Advised if symptoms do not improve or worsen, to please contact office for sooner follow up or seek emergency care.    I spent 32 minutes of dedicated to the care of this patient on the date of this encounter to include pre-visit review of records, face-to-face time with the patient discussing conditions above, post visit ordering of testing, clinical documentation with the electronic health record, making appropriate referrals as documented, and communicating necessary findings  to members of the patients care team.     C. Leita Sanders, MD Advanced Bronchoscopy [facility]    *This note was generated using voice recognition software/Dragon and/or AI transcription program.  Despite best efforts to proofread, errors can occur which can change the meaning. Any transcriptional errors that result from this process are unintentional and may not be fully corrected at the time of dictation.

## 2023-12-21 DIAGNOSIS — R0902 Hypoxemia: Secondary | ICD-10-CM | POA: Diagnosis not present

## 2023-12-21 DIAGNOSIS — G4730 Sleep apnea, unspecified: Secondary | ICD-10-CM | POA: Diagnosis not present

## 2024-01-03 ENCOUNTER — Ambulatory Visit (INDEPENDENT_AMBULATORY_CARE_PROVIDER_SITE_OTHER): Payer: Self-pay | Admitting: Urology

## 2024-01-03 VITALS — BP 140/62 | HR 81

## 2024-01-03 DIAGNOSIS — N3020 Other chronic cystitis without hematuria: Secondary | ICD-10-CM

## 2024-01-03 LAB — URINALYSIS, COMPLETE
Glucose, UA: NEGATIVE
Ketones, UA: NEGATIVE
Nitrite, UA: NEGATIVE
Protein,UA: NEGATIVE
RBC, UA: NEGATIVE
Specific Gravity, UA: 1.01 (ref 1.005–1.030)
Urobilinogen, Ur: 0.2 mg/dL (ref 0.2–1.0)
pH, UA: 6 (ref 5.0–7.5)

## 2024-01-03 LAB — MICROSCOPIC EXAMINATION: RBC, Urine: NONE SEEN /HPF (ref 0–2)

## 2024-01-03 MED ORDER — CEPHALEXIN 250 MG PO CAPS
250.0000 mg | ORAL_CAPSULE | Freq: Every day | ORAL | 11 refills | Status: AC | PRN
Start: 2024-01-03 — End: ?

## 2024-01-03 NOTE — Progress Notes (Signed)
 01/03/2024 11:04 AM   Tiffany Alexander [birth year] [id number]  Referring provider: Valora Lynwood FALCON, MD [address] [facility] [place],  [place] [address]  No chief complaint on file.   HPI: Reviewed my note.   I reviewed my notes from [facility] August 2021.  She failed Botox in the office and was painful.  She had reached the end of the treatment algorithm.  She had mixed incontinence and leakage without awareness and high-volume bedwetting soaking 6 pads a day.  She had a lot of urethral pain after the Botox out of the ordinary.  She had a bulking agent treatment done March 16, 2019.  She was on trimethoprim.  She had dramatic improvement in her incontinence but then started to leak again.  Bulking agent treatment October 26, 2019.  Technically went very well.  She called back with painful bladder spasms.  She actually did very well for her mixed incontinence with a bulking agent but it had poor durability  She thinks she may have an infection now.  She is getting for 5 bladder infections on trimethoprim.  She gets burning and feels poorly     I will switch the patient to daily Keflex .  Minimal dysuria today so I will call if positive.  30x11 Keflex  250 mg sent.  Rash with penicillin discussed.  See in 3 months.  No further treatment for complicated mixed incontinence   Infection free on daily Keflex .  90x3 sent to pharmacy.  Incontinence stable.  No active treatment.  Frequency stable   Patient says she is doing great on Keflex  but then describes burning and perhaps a negative urine.  She was given high-dose Cipro  for 10 days and she back to baseline.  She has some intermittent burning now but overall is doing very well.  If she did have a breakthrough this is the first 1.  No recent urine culture on chart     Patient had breakthrough infections on trimethoprim. There was pulmonary concerns with Macrodantin. She may have had 1 breakthrough infection. Try to get urine culture  today and call if positive. Renew Keflex  30 tablets and 11 refills and I will see in a year. Role of probiotics and cranberry discussed   Today Urgency and frequency stable.  Clinically not infected.  No breakthrough infections reassess in 1 year on Keflex   Today Frequency stable.  Incontinence stable.  Clinically not infected.  Doing well on cephalexin .    PMH: Past Medical History:  Diagnosis Date   Arthritis    osteoarthritis   Carpal tunnel syndrome of left wrist    Depression    Diabetes mellitus without complication (HCC)    Diverticulosis    GERD (gastroesophageal reflux disease)    Hypertension    PONV (postoperative nausea and vomiting)    PONV (postoperative nausea and vomiting)    Pulmonary fibrosis (HCC)    Recurrent UTI    SUI (stress urinary incontinence, female)    Wears dentures    full upper and lower    Surgical History: Past Surgical History:  Procedure Laterality Date    endocervical curettage     ABDOMINAL HYSTERECTOMY     BLADDER SUSPENSION     CARPAL TUNNEL RELEASE Left    CARPAL TUNNEL RELEASE Right 05/14/2015   Procedure: CARPAL TUNNEL RELEASE;  Surgeon: Ozell Flake, MD;  Location: [facility] ORS;  Service: Orthopedics;  Laterality: Right;   CATARACT EXTRACTION W/PHACO Right 12/17/2015   Procedure: CATARACT EXTRACTION PHACO  AND INTRAOCULAR LENS PLACEMENT (IOC);  Surgeon: Elsie Carmine, MD;  Location: [facility] ORS;  Service: Ophthalmology;  Laterality: Right;  US  52.0 AP% 15.4 CDE 7.99 Fluid pack lot # [id number]   CATARACT EXTRACTION W/PHACO Left 01/21/2016   Procedure: CATARACT EXTRACTION PHACO AND INTRAOCULAR LENS PLACEMENT (IOC);  Surgeon: Elsie Carmine, MD;  Location: [facility] ORS;  Service: Ophthalmology;  Laterality: Left;  US  50.4 AP% 19.6 CDE 9.92 FLUID PACK LOT # [id number]   CESAREAN SECTION N/A    COLONOSCOPY N/A 08/24/2014   Procedure: COLONOSCOPY;  Surgeon: Gladis RAYMOND Mariner, MD;  Location: [facility] ENDOSCOPY;  Service: Endoscopy;  Laterality:  N/A;   COLONOSCOPY N/A 03/13/2020   Procedure: COLONOSCOPY;  Surgeon: Toledo, Ladell POUR, MD;  Location: [facility] ENDOSCOPY;  Service: Gastroenterology;  Laterality: N/A;   COLONOSCOPY WITH PROPOFOL  N/A 04/05/2015   Procedure: COLONOSCOPY WITH PROPOFOL ;  Surgeon: Gladis RAYMOND Mariner, MD;  Location: [facility] ENDOSCOPY;  Service: Endoscopy;  Laterality: N/A;   COLPORRHAPHY     CORONARY STENT INTERVENTION N/A 05/05/2021   Procedure: CORONARY STENT INTERVENTION;  Surgeon: Darron Deatrice LABOR, MD;  Location: [facility] INVASIVE CV LAB;  Service: Cardiovascular;  Laterality: N/A;   EYE SURGERY     FINGER ARTHRODESIS Right 07/12/2014   Procedure: Right middle finger DIP fussion;  Surgeon: Ozell Flake, MD;  Location: [facility] ORS;  Service: Orthopedics;  Laterality: Right;   FRACTURE SURGERY     fractured wrist Right    HAMMER TOE SURGERY Left 04/01/2016   Procedure: HAMMER TOE CORRECTION  Left 2nd  & 3rd toe;  Surgeon: Eva Gay, DPM;  Location: [facility];  Service: Podiatry;  Laterality: Left;  Special:  Hammer lock implants Left 2nd & 3rd IVA LOcal Diabetic - oral meds   LEFT HEART CATH AND CORONARY ANGIOGRAPHY Left 05/05/2021   Procedure: LEFT HEART CATH AND CORONARY ANGIOGRAPHY;  Surgeon: Darron Deatrice LABOR, MD;  Location: [facility] INVASIVE CV LAB;  Service: Cardiovascular;  Laterality: Left;   OPEN REDUCTION INTERNAL FIXATION (ORIF) METACARPAL Left 05/14/2015   Procedure: OPEN REDUCTION INTERNAL FIXATION (ORIF) METACARPAL;  Surgeon: Ozell Flake, MD;  Location: [facility] ORS;  Service: Orthopedics;  Laterality: Left;   SEPTOPLASTY     TUBAL LIGATION      Home Medications:  Allergies as of 01/03/2024       Reactions   Codeine Itching   Erythromycin Other (See Comments)   Stomach cramps   Nitrofurantoin Other (See Comments)   Scarring of lung per pt   Shrimp Extract    Other reaction(s): Vomiting   Penicillins Rash   Has patient had a PCN reaction causing immediate rash, facial/tongue/throat swelling, SOB or  lightheadedness with hypotension: Yes Has patient had a PCN reaction causing severe rash involving mucus membranes or skin necrosis: Unknown Has patient had a PCN reaction that required hospitalization No Has patient had a PCN reaction occurring within the last 10 years: No If all of the above answers are NO, then may proceed with Cephalosporin use.        Medication List        Accurate as of January 03, 2024 11:04 AM. If you have any questions, ask your nurse or doctor.          albuterol  108 (90 Base) MCG/ACT inhaler Commonly known as: VENTOLIN  HFA 2 puffs Q4H PRN   amLODipine  10 MG tablet Commonly known as: NORVASC  Take 10 mg by mouth at bedtime.   arformoterol  15 MCG/2ML Nebu Commonly known as: BROVANA  Take 2 mLs (15  mcg total) by nebulization 2 (two) times daily.   ARIPiprazole 2 MG tablet Commonly known as: ABILIFY Take 2 mg by mouth once.   ascorbic acid  500 MG tablet Commonly known as: VITAMIN C Take 500 mg by mouth daily.   aspirin  81 MG chewable tablet Chew 1 tablet (81 mg total) by mouth daily.   budesonide  0.25 MG/2ML nebulizer solution Commonly known as: PULMICORT  Take 2 mLs (0.25 mg total) by nebulization 2 (two) times daily.   calcium  citrate-vitamin D 315-200 MG-UNIT tablet Commonly known as: CITRACAL+D Take 1 tablet by mouth 2 (two) times daily.   carvedilol  25 MG tablet Commonly known as: COREG  Take 1 tablet (25 mg total) by mouth 2 (two) times daily.   cephALEXin  250 MG capsule Commonly known as: KEFLEX  Take 1 capsule (250 mg total) by mouth daily as needed (UTI).   citalopram  20 MG tablet Commonly known as: CELEXA  Take 20 mg by mouth daily.   diphenhydrAMINE 25 MG tablet Commonly known as: BENADRYL Take 75 mg by mouth daily as needed for allergies.   diphenoxylate-atropine 2.5-0.025 MG tablet Commonly known as: LOMOTIL Take 1 tablet by mouth 2 (two) times daily as needed for diarrhea or loose stools.   fluticasone  50 MCG/ACT  nasal spray Commonly known as: FLONASE  Place 2 sprays into both nostrils 2 (two) times daily.   furosemide  20 MG tablet Commonly known as: LASIX  Take 20 mg by mouth daily as needed.   glipiZIDE  5 MG 24 hr tablet Commonly known as: GLUCOTROL  XL Take 5 mg by mouth daily with breakfast.   hydrALAZINE  50 MG tablet Commonly known as: APRESOLINE  Take 1 tablet (50 mg total) by mouth 3 (three) times daily.   hydrOXYzine  10 MG tablet Commonly known as: ATARAX  Take 10 mg by mouth every 8 (eight) hours as needed for itching.   Melatonin 10 MG Caps Take 20 mg by mouth at bedtime.   ondansetron  4 MG disintegrating tablet Commonly known as: ZOFRAN -ODT Take 1 tablet (4 mg total) by mouth every 8 (eight) hours as needed for nausea or vomiting.   OXYGEN  Inhale 2 L into the lungs at bedtime.   PREVACID PO Take by mouth.   PROBIOTIC BLEND PO Take 1 capsule by mouth daily.   rosuvastatin  20 MG tablet Commonly known as: CRESTOR  Take 1 tablet (20 mg total) by mouth at bedtime.   traMADol  50 MG tablet Commonly known as: ULTRAM  Take 50 mg by mouth 3 (three) times daily as needed for moderate pain.   traZODone  150 MG tablet Commonly known as: DESYREL  Take 150 mg by mouth at bedtime.   venlafaxine XR 37.5 MG 24 hr capsule Commonly known as: EFFEXOR-XR Take 37.5 mg by mouth daily.   VITAMIN B 12 PO Take 1 tablet by mouth daily.   Vitamin D3 25 MCG (1000 UT) Caps Take by mouth daily.   WOMENS MULTIVITAMIN PO Take 1 tablet by mouth daily.        Allergies:  Allergies  Allergen Reactions   Codeine Itching   Erythromycin Other (See Comments)    Stomach cramps   Nitrofurantoin Other (See Comments)    Scarring of lung per pt   Shrimp Extract     Other reaction(s): Vomiting   Penicillins Rash    Has patient had a PCN reaction causing immediate rash, facial/tongue/throat swelling, SOB or lightheadedness with hypotension: Yes Has patient had a PCN reaction causing severe rash  involving mucus membranes or skin necrosis: Unknown Has patient had a PCN  reaction that required hospitalization No Has patient had a PCN reaction occurring within the last 10 years: No If all of the above answers are NO, then may proceed with Cephalosporin use.     Family History: Family History  Problem Relation Age of Onset   Cancer Father    Breast cancer Maternal Aunt    Breast cancer Paternal Grandmother 40    Social History:  reports that she has never smoked. She has never been exposed to tobacco smoke. She has never used smokeless tobacco. She reports that she does not drink alcohol and does not use drugs.  ROS:                                        Physical Exam: There were no vitals taken for this visit.  Constitutional:  Alert and oriented, No acute distress. HEENT: [place] AT, moist mucus membranes.  Trachea midline, no masses.   Laboratory Data: Lab Results  Component Value Date   WBC 5.0 05/06/2021   HGB 11.9 (L) 05/06/2021   HCT 36.1 05/06/2021   MCV 89.1 05/06/2021   PLT 167 05/06/2021    Lab Results  Component Value Date   CREATININE 0.57 05/06/2021    No results found for: PSA  No results found for: TESTOSTERONE  No results found for: HGBA1C  Urinalysis    Component Value Date/Time   APPEARANCEUR Clear 01/04/2023 1045   GLUCOSEU Negative 01/04/2023 1045   BILIRUBINUR Negative 01/04/2023 1045   PROTEINUR Negative 01/04/2023 1045   NITRITE Negative 01/04/2023 1045   LEUKOCYTESUR Negative 01/04/2023 1045    Pertinent Imaging:   Assessment & Plan: Renew cephalexin  90 x 3.  CAD year.  We have reached the end of treatment pathway for incontinence and frequency call if culture positive  1. Chronic cystitis (Primary)  - Urinalysis, Complete   No follow-ups on file.  Glendia DELENA Elizabeth, MD  [facility] [address] [place], [place] [address] [phone]

## 2024-01-03 NOTE — Addendum Note (Signed)
 Addended byBETHA CORIE PLATER on: 01/03/2024 11:34 AM   Modules accepted: Orders

## 2024-01-06 LAB — CULTURE, URINE COMPREHENSIVE

## 2024-01-11 ENCOUNTER — Encounter: Payer: Self-pay | Admitting: Pulmonary Disease

## 2024-01-17 ENCOUNTER — Ambulatory Visit: Payer: Self-pay | Admitting: Pulmonary Disease

## 2024-01-17 NOTE — Progress Notes (Signed)
 Spoke with pt and she declines doing the home sleep study and said she will continue using her oxygen  at night.

## 2024-01-19 DIAGNOSIS — R42 Dizziness and giddiness: Secondary | ICD-10-CM | POA: Diagnosis not present

## 2024-01-19 DIAGNOSIS — H6123 Impacted cerumen, bilateral: Secondary | ICD-10-CM | POA: Diagnosis not present

## 2024-01-19 DIAGNOSIS — H903 Sensorineural hearing loss, bilateral: Secondary | ICD-10-CM | POA: Diagnosis not present

## 2024-02-23 ENCOUNTER — Other Ambulatory Visit: Payer: Self-pay | Admitting: Pulmonary Disease

## 2024-02-23 DIAGNOSIS — J4540 Moderate persistent asthma, uncomplicated: Secondary | ICD-10-CM

## 2024-02-28 ENCOUNTER — Other Ambulatory Visit: Payer: Self-pay | Admitting: Cardiovascular Disease

## 2024-04-11 ENCOUNTER — Ambulatory Visit: Admitting: Cardiovascular Disease

## 2024-05-29 ENCOUNTER — Ambulatory Visit: Admitting: Pulmonary Disease

## 2024-05-30 ENCOUNTER — Ambulatory Visit: Admitting: Cardiovascular Disease

## 2025-01-01 ENCOUNTER — Ambulatory Visit: Admitting: Urology
